# Patient Record
Sex: Male | Born: 1945
Health system: Southern US, Community
[De-identification: ages and names within clinical notes are randomized; demographics above are authoritative.]

## PROBLEM LIST (undated history)

## (undated) DIAGNOSIS — D62 Acute posthemorrhagic anemia: Secondary | ICD-10-CM

## (undated) DIAGNOSIS — Z9889 Other specified postprocedural states: Secondary | ICD-10-CM

## (undated) DIAGNOSIS — I712 Thoracic aortic aneurysm, without rupture: Secondary | ICD-10-CM

## (undated) DIAGNOSIS — Z8719 Personal history of other diseases of the digestive system: Secondary | ICD-10-CM

## (undated) DIAGNOSIS — K5790 Diverticulosis of intestine, part unspecified, without perforation or abscess without bleeding: Secondary | ICD-10-CM

## (undated) DIAGNOSIS — D649 Anemia, unspecified: Secondary | ICD-10-CM

## (undated) DIAGNOSIS — D369 Benign neoplasm, unspecified site: Secondary | ICD-10-CM

## (undated) DIAGNOSIS — I10 Essential (primary) hypertension: Secondary | ICD-10-CM

## (undated) DIAGNOSIS — J439 Emphysema, unspecified: Secondary | ICD-10-CM

## (undated) DIAGNOSIS — M1990 Unspecified osteoarthritis, unspecified site: Secondary | ICD-10-CM

## (undated) DIAGNOSIS — C3490 Malignant neoplasm of unspecified part of unspecified bronchus or lung: Secondary | ICD-10-CM

## (undated) DIAGNOSIS — I499 Cardiac arrhythmia, unspecified: Secondary | ICD-10-CM

## (undated) DIAGNOSIS — I509 Heart failure, unspecified: Secondary | ICD-10-CM

## (undated) DIAGNOSIS — G4700 Insomnia, unspecified: Secondary | ICD-10-CM

## (undated) DIAGNOSIS — I8500 Esophageal varices without bleeding: Secondary | ICD-10-CM

## (undated) HISTORY — DX: Thoracic aortic aneurysm, without rupture: I71.2

## (undated) HISTORY — PX: ROTATOR CUFF REPAIR: SHX139

## (undated) HISTORY — DX: Anemia, unspecified: D64.9

## (undated) HISTORY — DX: Essential (primary) hypertension: I10

## (undated) HISTORY — DX: Emphysema, unspecified: J43.9

## (undated) HISTORY — DX: Acute posthemorrhagic anemia: D62

## (undated) HISTORY — DX: Insomnia, unspecified: G47.00

## (undated) HISTORY — PX: HERNIA REPAIR: SHX51

## (undated) HISTORY — DX: Malignant neoplasm of unspecified part of unspecified bronchus or lung: C34.90

## (undated) HISTORY — DX: Esophageal varices without bleeding: I85.00

## (undated) HISTORY — PX: APPENDECTOMY: SHX54

## (undated) HISTORY — PX: TONSILLECTOMY: SUR1361

## (undated) HISTORY — DX: Diverticulosis of intestine, part unspecified, without perforation or abscess without bleeding: K57.90

## (undated) HISTORY — DX: Benign neoplasm, unspecified site: D36.9

## (undated) HISTORY — PX: EXCISIONAL HEMORRHOIDECTOMY: SHX1541

## (undated) HISTORY — DX: Personal history of other diseases of the digestive system: Z87.19

## (undated) HISTORY — DX: Other specified postprocedural states: Z98.890

## (undated) HISTORY — PX: CATARACT EXTRACTION, BILATERAL: SHX1313

---

## 2004-09-23 ENCOUNTER — Ambulatory Visit: Payer: Self-pay | Admitting: Family Medicine

## 2007-09-12 ENCOUNTER — Ambulatory Visit: Payer: Self-pay | Admitting: Internal Medicine

## 2007-09-12 DIAGNOSIS — I10 Essential (primary) hypertension: Secondary | ICD-10-CM | POA: Insufficient documentation

## 2007-09-17 ENCOUNTER — Telehealth: Payer: Self-pay | Admitting: Internal Medicine

## 2008-01-03 ENCOUNTER — Ambulatory Visit (HOSPITAL_BASED_OUTPATIENT_CLINIC_OR_DEPARTMENT_OTHER): Admission: RE | Admit: 2008-01-03 | Discharge: 2008-01-03 | Payer: Self-pay | Admitting: Orthopedic Surgery

## 2008-08-29 ENCOUNTER — Ambulatory Visit: Payer: Self-pay | Admitting: Internal Medicine

## 2008-08-29 ENCOUNTER — Emergency Department (HOSPITAL_COMMUNITY): Admission: EM | Admit: 2008-08-29 | Discharge: 2008-08-30 | Payer: Self-pay | Admitting: Emergency Medicine

## 2009-03-02 ENCOUNTER — Ambulatory Visit: Payer: Self-pay | Admitting: Gastroenterology

## 2009-03-17 ENCOUNTER — Encounter: Payer: Self-pay | Admitting: Gastroenterology

## 2009-03-17 ENCOUNTER — Ambulatory Visit: Payer: Self-pay | Admitting: Gastroenterology

## 2009-03-19 ENCOUNTER — Encounter: Payer: Self-pay | Admitting: Gastroenterology

## 2009-12-29 ENCOUNTER — Ambulatory Visit (HOSPITAL_BASED_OUTPATIENT_CLINIC_OR_DEPARTMENT_OTHER): Admission: RE | Admit: 2009-12-29 | Discharge: 2009-12-29 | Payer: Self-pay | Admitting: Surgery

## 2010-12-08 LAB — BASIC METABOLIC PANEL
CO2: 25 mEq/L (ref 19–32)
Chloride: 103 mEq/L (ref 96–112)
GFR calc Af Amer: >60 mL/min (ref >60)
Glucose, Bld: 121 mg/dL — ABNORMAL HIGH (ref 70–99)
Sodium: 134 mEq/L — ABNORMAL LOW (ref 135–145)

## 2011-02-01 NOTE — Op Note (Signed)
[name], [name]                ACCOUNT NO.:  [id number]   MEDICAL RECORD NO.:  [id number]          PATIENT TYPE:  AMB   LOCATION:  [facility]                         FACILITY:  [facility]   PHYSICIAN:  Deidre Ala, M.D.    DATE OF BIRTH:  06-20-1946   DATE OF PROCEDURE:  01/03/2008  DATE OF DISCHARGE:                               OPERATIVE REPORT   PREOPERATIVE DIAGNOSES:  1. Right shoulder impingement with type 4 acromion.  2. Rotator cuff tear retracted by MRI and biceps tendon rupture.   POSTOPERATIVE DIAGNOSES:  1. Right shoulder impingement with type 4 acromion.  2. Massive chronic rotator cuff tear avulsion off tuberosity.  3. Biceps tendon rupture with the intra-articular fragment.  4. Osteoarthritis AC joint.   PROCEDURES:  1. His right shoulder arthroscopy with mini open rotator cuff repair      of chronic retracted rotator cuff avulsion off tuberosity with      three BioCorkscrew anchors.  2. Arthroscopic subacromial arch decompression acromioplasty.  3. Arthroscopic distal clavicle resection.  4. Debride intra-articular long head biceps.   SURGEON:  1. Charlesetta Shanks, M.D.   ASSISTANT:  Phineas Semen, P.A.C.   ANESTHESIA:  General with LMA with endotracheal.   CULTURES:  None.   DRAINS:  None.   BLOOD LOSS:  Minimal to 100 mL.   REPLACED:  Without.   PATHOLOGIC FINDINGS AND HISTORY:  Richard Irwin came in to see me  following a shoulder injury.  He was sent for consultation by Dr. Doran Clay.  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  This was Feb 02, 2007.  He then  noted several days later the onset of a large amount of subcutaneous  bleeding and hematoma.  We felt he had a muscle sprain with bleeding mid  substance biceps right not distal and not proximal.  We got an MRI.  The  MRI showed a long head biceps rupture.  I think it was a retracted long  head biceps rupture.  There was a retracted  supraspinatus tear but a  full MRI was needed to further assess.  He was having pain just reaching  to start his car keys in the ignition.  We got a gadolinium MRI of the  right shoulder which showed a rotator cuff tear, 2 cm retracted and he  was denied Workers' Comp.  At this point, he had a very sharp huge type  4 anterior acromion.  He did not have pain but the pain worsened over  time and we felt that there was some possibility that he might be able  to repair his rotator cuff.  In any case, he finally went ahead on his  regular insurance with the surgical intervention.  At surgery, we found  a torn biceps with about a 1.5 cm stump of biceps intra-articular that  was impinging.  This was resected back to the superior labrum which was  intact.  The labral rim was somewhat frayed around, we debrided that.  There was some synovitis in  the joint.  The glenohumeral surfaces looked  good.  He then had a sharp craggy very large anterior acromial hook  actually bilobate which we resected to Caspari margins.  He had obvious  arthritic distal clavicle.  From this side, his rotator cuff tear  measured 2.5 x 2.5 cm retracted off the tuberosity.  He had an apex but  it pulled back to the tuberosity straight line across as is often the  case and it was retractable back to a nice footprint at the base of the  tuberosity.  The footprint was freshened and three BioCorkscrew anchors,  each containing to #1 FiberWires were placed in a triangular pattern in  the footprint and the rotator cuff was brought back to it, sutured from  underneath the top with horizontal mattress sutures #6 and an additional  central tagging suture #2 Ethibond which was used and taken through the  tuberosity.  The suture knots on the FiberWires were laid down with the  tails taken through the soft tissues laterally with an additional knot  tied there to get the knot out of the joint.  We then palpated the  acromioplasty distal  clavicle resection at the open surgery and they  were very good resections.  Good deltoid repair was obtained.   PROCEDURE:  With adequate anesthesia obtained using endotracheal  technique, 1 gram Ancef given IV prophylaxis, the patient was placed in  the supine beach chair position, the right shoulder was then prepped and  draped in standard fashion.  Anatomic markings were then made with a  pin.  We then injected 20 mL 0.5% Marcaine in the subacromial space to  open it up.  I then entered the shoulder through a posterior portal,  anterior portal was established just lateral to the coracoid.  I then  entered the shoulder joint, found the biceps rupture and through the  anterior portal, brought in a basket and shaver and debrided it back to  the superior labrum.  Ablator was then used on one to smooth and further  synovectomy and labral fraying was debrided.  Portals reversed and  similar shavings carried out.  I then entered the shoulder through the  posterior portal, anterolateral portal was established.  This was into  the subacromial space.  I then brought in the shaver and debrided soft  tissue from the anterior undersurface of the acromion and used the  ablator to cauterize.  I then brought in a 6 and completed acromioplasty  to the roof of the subacromial space in the manner of Caspari.  The  scope was then turned medially sideways where through the anterior  portal, I debrided AC meniscus and completed distal clavicle resection  two shaver breadths in.  I then entered the shoulder from the  anterolateral portal, further debrided the distal clavicle and completed  acromioplasty back to the bicortical bone in the manner of Caspari.  The  scope was then turned downward.  I debrided some of the leading edge of  the rotator cuff tear but further evaluated it and found it to be a  retracted tear as above.  I made an additional portal to bring in a  pituitary forceps and was able to pull  the tear back to the footprint.  I then painted and prepped with Betadine and then made an incision from  the anterior acromion to the anterolateral portal which was now  posterolateral to the additional portal I made.  Then incision deepened  sharply  with a knife and hemostasis obtained using the Bovie  electrocoagulator.  Retractors were placed and dissection was carried  down through the deltoid.  Retractors were placed deep and then I  entered the shoulder joint exposing the rotator cuff tear at the  tuberosity.  Then irrigated and used suction and came down upon the  rotator cuff.  I slightly debrided the prominent tuberosity and  freshened the footprint.  A  tagging stitch of #2 Ethibond was placed  from underneath horizontal mattress to retract on the suture.  I then  placed three  5.5 BioCorkscrew anchors without tamping in a V-shaped  pattern apex lateral in the footprint.  The FiberWires were then taken  across the row of the rotator cuff about 7 mm in and up and through with  horizontal mattress sutures.  I then retracted the rotator cuff back to  the tuberosity to the footprint and suture all FiberWire sutures down  nicely repairing the rotator cuff down into the footprint.  I then took  the #2 Ethibond through the tuberosity and out laterally to it and  sutured that down.  When I tied that knot, the closer FiberWire tails of  the knots were then sutured down to make the knot flatten out.  I then  took the rest of the knots of the remaining four and took the tails  through the soft tissues laterally, did four stitches there, four throws  there and that flattened the knots out across the board at the  tuberosity further flattening down the rotator cuff sutures at the edge  of the footprint.  Irrigation was carried out.  Sponge count was  correct.  The wound was then closed with running locking #1 Vicryl on  the deltoid just lateral to the anterior acromion with good fascial  and  muscular repair obtained.  I then used 0 and 2-0 Vicryl on the subcu and  skin staples.  The portals were closed with a  4-0 nylon.  Bulky sterile compressive dressing was applied with sling  and the patient, having tolerated the procedure well, was awakened,  taken to the recovery room in satisfactory condition to be discharged  per outpatient routine.  Given Percocet and some Mepergan Fortis for  pain.  Told call the office for appointment for recheck tomorrow.           ______________________________  V. Charlesetta Shanks, M.D.     VEP/MEDQ  D:  01/03/2008  T:  01/03/2008  Job:  [id number]   cc:   Al Decant. Janey Greaser, MD  Fax: [phone]

## 2011-02-01 NOTE — Consult Note (Signed)
NAMELEMARCUS, [name]:  [id number]   MEDICAL RECORD NO.:  [id number]          PATIENT TYPE:  EMS   LOCATION:  [facility]                         FACILITY:  [facility]   PHYSICIAN:  Gordy Savers, MDDATE OF BIRTH:  1945-09-25   DATE OF CONSULTATION:  DATE OF DISCHARGE:                                 CONSULTATION   CHIEF COMPLAINT:  Loss of consciousness.   HISTORY OF PRESENT ILLNESS:  The patient is a 65 year old gentleman with  a history of treated hypertension.  He was stable until this evening  when shortly after finishing a meal at a restaurant, he became slightly  nauseated and flushed.  He became quite weak and felt that he may pass  out, and in fact did have a syncopal episode.  His wife describes the  patient as being quite pale and diaphoretic.  EMS service evaluated the  patient and he was noted initially to be slightly hypotensive with  bradycardia.  The patient was transported via EMS to the emergency  department for evaluation.  The patient states that he quickly became  very alert and felt quite well and has not had any recurrent symptoms.  He has been observed in the emergency department setting for  approximately 4 hours without any rhythm disturbance.  He has felt well  and has been ambulatory.  ED evaluation included an electrocardiogram  that was normal.  On arrival, the patient was slightly hypotensive with  a blood pressure of 107/76.  Subsequent blood pressures have been in the  120/80 range.  Laboratory screen was unremarkable.  This included a  urinalysis, chemistries, and cardiac markers.  A head CT was normal.  Chest x-ray revealed normal heart size and no pulmonary infiltrates.   The patient denies any headaches or history of seizures, and no seizure  activity, incontinence, or tongue biting were noted at the scene.  At  the present time, he complains of no numbness, weakness, or any focal  neurological symptoms.  He states that a  number of years ago, he had a  very similar episode after consuming oysters.   Past medical history is fairly unremarkable except for a treated  hypertension.  He is on a single unclear antihypertensive medication.  He has no drug allergies.  Past medical history is fairly unremarkable  without medical hospital admissions.  He has had some shrapnel injury in  the [place] War.  He has had right rotator cuff surgery and remote  appendectomy.  In 1971, he underwent a tonsillectomy and in 1998 a  hemorrhoidectomy.  A right inguinal hernia repair was performed in 1979.   Family history is noncontributory.  Father died at age 70 of alcoholic  cirrhosis.  Mother died recently of lung cancer at 78.  Two sisters are  well.   SOCIAL HISTORY:  Married, nonsmoker.   PHYSICAL EXAMINATION:  GENERAL:  The patient to be alert, healthy  appearing, fit, in no acute distress.  VITAL SIGNS:  Blood pressure is 120/78 without orthostatic change, pulse  was low 70s and regular.  SKIN:  Warm and dry  without rash.  HEENT:  Head and neck revealed no signs of trauma.  Pupil responses were  normal.  Conjunctivae clear.  ENT normal.  NECK:  No bruits or adenopathy.  CHEST:  Clear.  CARDIOVASCULAR:  Revealed normal S1 and S2.  No murmurs or gallops  noted.  ABDOMEN:  Benign.  EXTREMITIES:  Revealed full pulses.  No edema.  NEUROLOGIC:  The patient was alert and oriented.  Cranial nerve  examination was unremarkable.  Motor exam revealed no weakness.  Gait  was normal.  Reflexes were symmetrical.   IMPRESSION:  Vasovagal syncope.   DISPOSITION:  Options were discussed including overnight hospital  admission.  The patient wishes to defer unless this was strongly  medically indicated.  The patient was discharged and has been asked to  hold his blood pressure medication and follow up with his primary care  Richard Irwin within the next 3-4 days and report any further symptoms.   CONDITION ON DISCHARGE:   Stable.      Gordy Savers, MD  Electronically Signed     PFK/MEDQ  D:  08/30/2008  T:  08/30/2008  Job:  [id number]

## 2011-06-14 LAB — POCT I-STAT, CHEM 8
BUN: 11
Creatinine, Ser: 1.3
Glucose, Bld: 112 — ABNORMAL HIGH
Potassium: 3.7
Sodium: 137

## 2011-06-24 LAB — URINALYSIS, ROUTINE W REFLEX MICROSCOPIC
Glucose, UA: NEGATIVE mg/dL
Hgb urine dipstick: NEGATIVE
Specific Gravity, Urine: 1.012 (ref 1.005–1.030)
pH: 7 (ref 5.0–8.0)

## 2011-06-24 LAB — POCT CARDIAC MARKERS
CKMB, poc: 1.2 ng/mL (ref 1.0–8.0)
Troponin i, poc: <0.05 ng/mL (ref 0.00–0.09)

## 2011-06-24 LAB — URINE CULTURE
Colony Count: NO GROWTH
Culture: NO GROWTH

## 2011-06-24 LAB — DIFFERENTIAL
Basophils Relative: 1 % (ref 0–1)
Eosinophils Absolute: 0.1 10*3/uL (ref 0.0–0.7)
Lymphs Abs: 1.6 10*3/uL (ref 0.7–4.0)
Monocytes Absolute: 0.5 10*3/uL (ref 0.1–1.0)
Monocytes Relative: 9 % (ref 3–12)

## 2011-06-24 LAB — CBC
HCT: 40.7 % (ref 39.0–52.0)
MCHC: 34.1 g/dL (ref 30.0–36.0)

## 2011-06-24 LAB — POCT I-STAT, CHEM 8
Calcium, Ion: 1.11 mmol/L — ABNORMAL LOW (ref 1.12–1.32)
Chloride: 102 mEq/L (ref 96–112)
Creatinine, Ser: 1.3 mg/dL (ref 0.4–1.5)
Glucose, Bld: 124 mg/dL — ABNORMAL HIGH (ref 70–99)
HCT: 43 % (ref 39.0–52.0)

## 2011-12-21 ENCOUNTER — Encounter: Payer: Self-pay | Admitting: Gastroenterology

## 2012-12-21 ENCOUNTER — Encounter: Payer: Self-pay | Admitting: Gastroenterology

## 2013-04-30 ENCOUNTER — Other Ambulatory Visit: Payer: Self-pay | Admitting: *Deleted

## 2013-04-30 DIAGNOSIS — I77810 Thoracic aortic ectasia: Secondary | ICD-10-CM

## 2013-05-02 LAB — CREATININE, SERUM: Creat: 0.9 mg/dL (ref 0.50–1.35)

## 2013-05-03 ENCOUNTER — Ambulatory Visit
Admission: RE | Admit: 2013-05-03 | Discharge: 2013-05-03 | Disposition: A | Discharge status: Home / Self Care | Payer: Medicare Other | Source: Ambulatory Visit | Attending: Thoracic Surgery (Cardiothoracic Vascular Surgery) | Admitting: Thoracic Surgery (Cardiothoracic Vascular Surgery)

## 2013-05-03 DIAGNOSIS — I77810 Thoracic aortic ectasia: Secondary | ICD-10-CM

## 2013-05-03 MED ORDER — IOHEXOL 350 MG/ML SOLN
80.0000 mL | Freq: Once | INTRAVENOUS | Status: AC | PRN
  Administered 2013-05-03: 80 mL via INTRAVENOUS

## 2013-05-06 DIAGNOSIS — I77810 Thoracic aortic ectasia: Secondary | ICD-10-CM | POA: Insufficient documentation

## 2013-05-07 ENCOUNTER — Institutional Professional Consult (permissible substitution) (INDEPENDENT_AMBULATORY_CARE_PROVIDER_SITE_OTHER): Payer: Medicare Other | Admitting: Thoracic Surgery (Cardiothoracic Vascular Surgery)

## 2013-05-07 ENCOUNTER — Encounter: Payer: Self-pay | Admitting: Thoracic Surgery (Cardiothoracic Vascular Surgery)

## 2013-05-07 VITALS — BP 134/87 | HR 82 | Resp 16 | Ht 72.0 in | Wt 187.0 lb

## 2013-05-07 DIAGNOSIS — I77810 Thoracic aortic ectasia: Secondary | ICD-10-CM

## 2013-05-07 DIAGNOSIS — I7121 Aneurysm of the ascending aorta, without rupture: Secondary | ICD-10-CM

## 2013-05-07 DIAGNOSIS — I712 Thoracic aortic aneurysm, without rupture: Secondary | ICD-10-CM

## 2013-05-07 HISTORY — DX: Thoracic aortic aneurysm, without rupture: I71.2

## 2013-05-07 HISTORY — DX: Aneurysm of the ascending aorta, without rupture: I71.21

## 2013-05-07 NOTE — H&P (Signed)
[address]       [place] [address]             [phone]     CARDIOTHORACIC SURGERY CONSULTATION REPORT  Referring Provider is Donato Schultz, MD PCP is Daisy Floro, MD  Chief Complaint  Patient presents with  . Thoracic Aortic Aneurysm    eval and treat.Marland KitchenMarland KitchenCTA CHEST, ECHO    HPI:  Patient is a 67 year old retired white male from [place] with history of hypertension who was recently referred to Dr. Anne Fu for evaluation of new onset symptoms of exertional chest tightness and shortness of breath. He reportedly underwent radionucleotide stress test that was felt to be low risk for coronary ischemia. He also had a transthoracic echocardiogram performed which revealed normal left ventricular size and function with an enlarged aortic root was felt to measure close to 5 cm in diameter. The patient was referred for surgical consultation.  The patient reports relatively recent onset of symptoms of exertional shortness of breath tightness across his chest. This occurs only with strenuous physical activity such as mowing the lawn. Symptoms are promptly relieved by rest.  He otherwise denies any symptoms of chest pain or back pain which could be in any manner related to his thoracic aorta. He remains reasonably active physically and has no significant physical limitations. He specifically denies any resting chest pain or shortness of breath. He has not had any tachypalpitations, dizzy spells, nor syncope.  Past Medical History  Diagnosis Date  . Hypertension   . Insomnia   . Thoracic ascending aortic aneurysm 05/07/2013    Past Surgical History  Procedure Laterality Date  . Appendectomy    . Hernia repair    . Rotator cuff repair      Family History  Problem Relation Age of Onset  . Cirrhosis Father   . Lung cancer Mother     History   Social History  . Marital Status: Married    Spouse Name: N/A    Number of Children: N/A  . Years of  Education: N/A   Occupational History  . retired Airline pilot    Social History Main Topics  . Smoking status: Never Smoker   . Smokeless tobacco: Never Used  . Alcohol Use: Yes     Comment: beer  . Drug Use: No  . Sexual Activity: Not on file   Other Topics Concern  . Not on file   Social History Narrative  . No narrative on file    Current Outpatient Prescriptions  Medication Sig Dispense Refill  . aspirin 81 MG tablet Take 81 mg by mouth daily.      . hydrochlorothiazide (MICROZIDE) 12.5 MG capsule Take 12.5 mg by mouth daily.      Marland Kitchen losartan (COZAAR) 50 MG tablet Take 50 mg by mouth daily.      . metoprolol (LOPRESSOR) 50 MG tablet Take 50 mg by mouth daily.      . sildenafil (VIAGRA) 100 MG tablet Take 100 mg by mouth daily as needed for erectile dysfunction.      . traZODone (DESYREL) 50 MG tablet Take 50 mg by mouth at bedtime.       No current facility-administered medications for this visit.    Allergies  Allergen Reactions  . Lisinopril Cough      Review of Systems:   General:  normal appetite, decreased energy, no weight gain, no weight loss, no fever  Cardiac:  + chest pain with  exertion, no chest pain at rest, + SOB with strenuous exertion, no resting SOB, no PND, no orthopnea, no palpitations, no arrhythmia, no atrial fibrillation, no LE edema, no dizzy spells, no syncope  Respiratory:  no shortness of breath, no home oxygen, no productive cough, no dry cough, no bronchitis, no wheezing, no hemoptysis, no asthma, no pain with inspiration or cough, no sleep apnea, no CPAP at night  GI:   no difficulty swallowing, + reflux, + frequent heartburn, no hiatal hernia, no abdominal pain, no constipation, no diarrhea, no hematochezia, no hematemesis, no melena  GU:   no dysuria,  + frequency, no urinary tract infection, no hematuria, no enlarged prostate, no kidney stones, no kidney disease  Vascular:  no pain suggestive of claudication, no pain in feet, no leg cramps, no  varicose veins, no DVT, no non-healing foot ulcer  Neuro:   no stroke, no TIA's, no seizures, no headaches, no temporary blindness one eye,  no slurred speech, no peripheral neuropathy, no chronic pain, no instability of gait, no memory/cognitive dysfunction  Musculoskeletal: no arthritis, no joint swelling, no myalgias, no difficulty walking, normal mobility   Skin:   no rash, no itching, no skin infections, no pressure sores or ulcerations  Psych:   + anxiety, + depression, no nervousness, no unusual recent stress  Eyes:   no blurry vision, no floaters, no recent vision changes, no wears glasses or contacts  ENT:   no hearing loss, no loose or painful teeth, no dentures  Hematologic:  + easy bruising, no abnormal bleeding, no clotting disorder, no frequent epistaxis  Endocrine:  no diabetes, does not check CBG's at home     Physical Exam:   BP 134/87  Pulse 82  Resp 16  Ht 6' (1.829 m)  Wt 84.823 kg (187 lb)  BMI 25.36 kg/m2  SpO2 98%  General:    well-appearing  HEENT:  Unremarkable   Neck:   no JVD, no bruits, no adenopathy   Chest:   clear to auscultation, symmetrical breath sounds, no wheezes, no rhonchi   CV:   RRR, no murmur   Abdomen:  soft, non-tender, no masses   Extremities:  warm, well-perfused, pulses palpable, no LE edema  Rectal/GU  Deferred  Neuro:   Grossly non-focal and symmetrical throughout  Skin:   Clean and dry, no rashes, no breakdown   Diagnostic Tests:   CT ANGIOGRAPHY CHEST  Technique: Multidetector CT imaging of the chest using the  standard protocol during bolus administration of intravenous  contrast. Multiplanar reconstructed images including MIPs were  obtained and reviewed to evaluate the vascular anatomy.  Contrast: 80mL OMNIPAQUE IOHEXOL 350 MG/ML SOLN  Comparison: None.  Findings: Ascending aortic aneurysm, aorta measuring 4.9 cm  transverse diameter at the sinotubular junction, 4.6 cm proximal  ascending, 4.3 cm distal ascending, 3.9 cm  proximal arch, 3.0 cm  distal arch, 3.5 cm proximal descending, tapering to a 2.6 cm in  the distal descending just above the diaphragm. Negative for  dissection. Classic three-vessel brachiocephalic arterial origin  anatomy without proximal stenosis.  There is incomplete opacification of the pulmonary arterial tree;  exam was not optimized for detection of pulmonary emboli. No  pleural or pericardial effusion. Extensive coronary  calcifications. Mild four-chamber cardiac enlargement. Sub  centimeter precarinal, AP window, and right paratracheal lymph  nodes. No hilar adenopathy. Plate-like atelectasis or scarring in  the left lower lobe. Focal atelectasis/consolidation at the  inferior extent of the lingula. Right lung clear. Mild  diffuse  fatty infiltration of the liver. Mild atheromatous plaque in the  visualized abdominal aorta without dissection or aneurysm.  Remainder visualized upper abdomen unremarkable. Minimal  spondylitic changes in the mid and lower thoracic spine. Sternum  intact.  IMPRESSION:  1. 4.9 cm ascending aortic aneurysm as above, without complicating  features.  2. Atherosclerosis, including . coronary artery disease. Please  note that although the presence of coronary artery calcium  documents the presence of coronary artery disease, the severity of  this disease and any potential stenosis cannot be assessed on this  non-gated CT examination. Assessment for potential risk factor  modification, dietary therapy or pharmacologic therapy may be  warranted, if clinically indicated.  Original Report Authenticated By: D. Andria Rhein, MD    Aortic Size Index=       4.9  /Body surface area is 2.08 meters squared. = 2.36  < 2.75 cm/m2      4% risk per year 2.75 to 4.25          8% risk per year > 4.25 cm/m2    20% risk per year  cross sectional area of aorta cm2/height in meters > 10 consider  Surgery   TRANSTHORACIC ECHOCARDIOGRAM  Report and images of  transthoracic echocardiogram performed 04/18/2013 are both reviewed. There is mild concentric left ventricular hypertrophy with normal left ventricular systolic function. Ejection fraction was estimated 55-60%. There were no regional wall motion abnormalities. There was trace mitral regurgitation, trace aortic regurgitation, and trivial tricuspid regurgitation.  The aortic valve appeared tri-leaflet and normal anatomically. There was moderate dilatation of the aortic root measuring close to 5 cm at the sinus of Valsalva.   Impression:  Moderate dilatation of the aortic root and ascending thoracic aorta. The CT angiogram was not cardiac gated and there does appear to be some significant motion artifact in the aortic root. The maximum diameter was reported to be 4.9 cm, but this may be generous. The ascending thoracic aorta above the sinotubular junction measures just less than 4 cm. The descending thoracic aorta measures greater than 3 cm.  Overall the associated potential risk for rupture or dissection should be quite low. The patient does have hypertension, but this appears to be under control with medical therapy at the present time. The patient's symptoms are suspicious for angina pectoris, although reportedly his recent nuclear stress test was felt to be low risk.   Plan:  We will plan followup cardiac gated CT angiogram of the chest in one year. The patient and his wife have been counseled regarding the indications of his anatomical finding. Overall the risk of potential aortic dissection or rupture should remain quite low as long as his blood pressure is controlled medically. The patient has no significant physical restrictions.    Salvatore Decent. Cornelius Moras, MD 05/07/2013 1:08 PM

## 2013-06-07 ENCOUNTER — Institutional Professional Consult (permissible substitution): Payer: Medicare Other | Admitting: Internal Medicine

## 2013-07-19 ENCOUNTER — Encounter: Payer: Self-pay | Admitting: Internal Medicine

## 2013-07-19 ENCOUNTER — Ambulatory Visit (INDEPENDENT_AMBULATORY_CARE_PROVIDER_SITE_OTHER): Payer: Medicare Other | Admitting: Internal Medicine

## 2013-07-19 ENCOUNTER — Ambulatory Visit (INDEPENDENT_AMBULATORY_CARE_PROVIDER_SITE_OTHER)
Admission: RE | Admit: 2013-07-19 | Discharge: 2013-07-19 | Disposition: A | Discharge status: Home / Self Care | Payer: Medicare Other | Source: Ambulatory Visit | Attending: Internal Medicine | Admitting: Internal Medicine

## 2013-07-19 VITALS — BP 118/80 | HR 94 | Ht 71.0 in | Wt 189.8 lb

## 2013-07-19 DIAGNOSIS — R634 Abnormal weight loss: Secondary | ICD-10-CM

## 2013-07-19 DIAGNOSIS — Z23 Encounter for immunization: Secondary | ICD-10-CM

## 2013-07-19 DIAGNOSIS — R0609 Other forms of dyspnea: Secondary | ICD-10-CM

## 2013-07-19 DIAGNOSIS — R0989 Other specified symptoms and signs involving the circulatory and respiratory systems: Secondary | ICD-10-CM

## 2013-07-19 NOTE — Patient Instructions (Signed)
Flu vax  Order- CXR                             Dx dyspnea with exertion  Order- schedule PFT and 6 MWT      Sample Spiriva inhaler      1 puff once daily

## 2013-07-19 NOTE — Progress Notes (Signed)
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 Mother was a smoker, dying with emphysema and lung cancer. [name] reports "agent orange" exposure in [place] and questions if that affected his lungs he has been aware of some shortness of breath especially with exertion over the last 7 months. Onset seemed abrupt in April while mowing and was recurrent and episodic initially. He is noticing shortness of breath as he lies back into bed after getting up at night for bathroom. Occasional mild wheeze and morning cough with no phlegm. Some chest tightness while exerting but no chest pain or palpitation. He reports loss of energy and has lost 8 or 10 pounds in the past year. A sample of pro-air rescue inhaler was no help. He has had episodes of acute bronchitis but no pneumonia. He denies anemia, allergy or sinus disease. Office spirometry 05/17/2013/ Dr Tenny Craw- moderate restriction of forced vital capacity with normal spirometry flows, and reduced and parallel with reduced vital capacity. CT chest for aortic aneurysm 04/30/13 There is incomplete opacification of the pulmonary arterial tree;  exam was not optimized for detection of pulmonary emboli. No  pleural or pericardial effusion. Extensive coronary  calcifications. Mild four-chamber cardiac enlargement. Sub  centimeter precarinal, AP window, and right paratracheal lymph  nodes. No hilar adenopathy. Plate-like atelectasis or scarring in  the left lower lobe. Focal atelectasis/consolidation at the  inferior extent of the lingula. Right lung clear. Mild diffuse  fatty infiltration of the liver. Mild atheromatous plaque in the  visualized abdominal aorta without dissection or aneurysm.  Remainder visualized upper abdomen unremarkable. Minimal  spondylitic changes in the mid and lower thoracic spine. Sternum  intact.  IMPRESSION:  1. 4.9 cm ascending  aortic aneurysm as above, without complicating  features.  2. Atherosclerosis, including . coronary artery disease. Please  note that although the presence of coronary artery calcium  documents the presence of coronary artery disease, the severity of  this disease and any potential stenosis cannot be assessed on this  non-gated CT examination. Assessment for potential risk factor  modification, dietary therapy or pharmacologic therapy may be  warranted, if clinically indicated.  Original Report Authenticated By: D. Andria Rhein, MD  Prior to Admission medications   Medication Sig Start Date End Date Taking? Authorizing Provider  hydrochlorothiazide (HYDRODIURIL) 25 MG tablet Take 12.5 mg by mouth daily.   Yes Historical Provider, MD  losartan (COZAAR) 100 MG tablet Take 50 mg by mouth daily.   Yes Historical Provider, MD  sildenafil (VIAGRA) 100 MG tablet Take 100 mg by mouth daily as needed for erectile dysfunction.   Yes Historical Provider, MD  traZODone (DESYREL) 50 MG tablet Take 50 mg by mouth at bedtime.   Yes Historical Provider, MD  aspirin 81 MG tablet Take 81 mg by mouth daily.    Historical Provider, MD  tiotropium (SPIRIVA) 18 MCG inhalation capsule Place 1 capsule (18 mcg total) into inhaler and inhale daily. 07/24/13   Waymon Budge, MD   Past Medical History  Diagnosis Date  . Hypertension   . Insomnia   . Thoracic ascending aortic aneurysm 05/07/2013   Past Surgical History  Procedure Laterality Date  . Appendectomy    . Hernia repair    . Rotator cuff repair     Family History  Problem Relation Age of Onset  . Cirrhosis Father   . Lung cancer Mother    History  Social History  . Marital Status: Married    Spouse Name: N/A    Number of Children: 1  . Years of Education: N/A   Occupational History  . retired Airline pilot    Social History Main Topics  . Smoking status: Never Smoker   . Smokeless tobacco: Never Used  . Alcohol Use: Yes     Comment: beer 2-4   . Drug Use: No  . Sexual Activity: Not on file   Other Topics Concern  . Not on file   Social History Narrative  . No narrative on file   ROS-see HPI Constitutional:   No-   weight loss, night sweats, fevers, chills, fatigue, lassitude. HEENT:   No-  headaches, difficulty swallowing, tooth/dental problems, sore throat,       No-  sneezing, itching, ear ache, nasal congestion, post nasal drip,  CV:  No-   chest pain, orthopnea, PND, swelling in lower extremities, anasarca, dizziness, palpitations Resp: + shortness of breath with exertion or at rest.              No-   productive cough,  + non-productive cough,  No- coughing up of blood.              No-   change in color of mucus.  No- wheezing.  +snores Skin: No-   rash or lesions. GI:  + heartburn, indigestion, no-abdominal pain, nausea, vomiting, diarrhea,                 change in bowel habits, +loss of appetite GU: No-   dysuria, change in color of urine, no urgency or frequency.  No- flank pain. MS:  No-   joint pain or swelling.  No- decreased range of motion.  No- back pain. Neuro-     nothing unusual Psych:  No- change in mood or affect. No depression, + anxiety.  No memory loss.  OBJ- Physical Exam General- Alert, Oriented, Affect-appropriate, Distress- none acute. Medium build Skin- rash-none, lesions- none, excoriation- none Lymphadenopathy- none Head- atraumatic            Eyes- Gross vision intact, PERRLA, conjunctivae pale            Ears- Hearing, canals-normal            Nose- Clear, no-Septal dev, mucus, polyps, erosion, perforation             Throat- Mallampati III , mucosa clear , drainage- none, tonsils- atrophic Neck- flexible , trachea midline, no stridor , thyroid nl, carotid no bruit Chest - symmetrical excursion , unlabored           Heart/CV- RRR , no murmur , no gallop  , no rub, nl s1 s2                           - JVD- none , edema- none, stasis changes- none, varices- none           Lung- clear  to P&A, wheeze- none, cough- none , dullness-none, rub- none           Chest wall-  Abd- tender-no, distended-no, bowel sounds-present, HSM- no Br/ Gen/ Rectal- Not done, not indicated Extrem- cyanosis- none, clubbing, none, atrophy- none, strength- nl Neuro- grossly intact to observation

## 2013-07-24 ENCOUNTER — Other Ambulatory Visit: Payer: Self-pay | Admitting: Internal Medicine

## 2013-07-24 MED ORDER — TIOTROPIUM BROMIDE MONOHYDRATE 18 MCG IN CAPS: 18.0000 ug | ORAL_CAPSULE | Freq: Every day | RESPIRATORY_TRACT | Status: DC

## 2013-07-24 NOTE — Progress Notes (Signed)
Quick Note:  Spoke with the pt and notified of results per Dr Maple Hudson  Denied any questions ______

## 2013-08-04 DIAGNOSIS — R634 Abnormal weight loss: Secondary | ICD-10-CM | POA: Insufficient documentation

## 2013-08-04 DIAGNOSIS — R0609 Other forms of dyspnea: Secondary | ICD-10-CM | POA: Insufficient documentation

## 2013-08-04 NOTE — Assessment & Plan Note (Signed)
Decreased appetite and reported 8- 10 pound weight loss in the past year

## 2013-08-04 NOTE — Assessment & Plan Note (Addendum)
Imaging demonstrate significant atherosclerosis. This may explain his dyspnea. Pulmonary disease is not yet definitely identified. The reduced forced vital capacity may be an effort issue Plan-flu vaccine, verify that lab work elsewhere has not shown anemia. Schedule full PFT and 6 minute walk test. We discussed his history of mild prostatitis and he is willing to try Spiriva. Questionable changes on chest x-ray left lower lobe for followup imaging

## 2013-09-02 ENCOUNTER — Ambulatory Visit (INDEPENDENT_AMBULATORY_CARE_PROVIDER_SITE_OTHER): Payer: Medicare Other | Admitting: Internal Medicine

## 2013-09-02 ENCOUNTER — Encounter (INDEPENDENT_AMBULATORY_CARE_PROVIDER_SITE_OTHER): Payer: Self-pay

## 2013-09-02 ENCOUNTER — Encounter: Payer: Self-pay | Admitting: Internal Medicine

## 2013-09-02 VITALS — BP 134/92 | HR 117 | Ht 71.0 in | Wt 189.0 lb

## 2013-09-02 DIAGNOSIS — R0609 Other forms of dyspnea: Secondary | ICD-10-CM

## 2013-09-02 LAB — PULMONARY FUNCTION TEST
DLCO unc % pred: 80 %
DLCO unc: 27.06 ml/min/mmHg
FEF 25-75 Post: 2.5 L/sec
FEV1-%Change-Post: 6 %
FEV1-%Pred-Post: 75 %
FEV1-Post: 2.63 L
FEV1FVC-%Change-Post: 4 %
FEV6-%Pred-Pre: 71 %
FEV6-Post: 3.26 L
FEV6-Pre: 3.18 L
FEV6FVC-%Change-Post: 1 %
FEV6FVC-%Pred-Post: 105 %
FEV6FVC-%Pred-Pre: 103 %
FVC-%Change-Post: 1 %
FVC-%Pred-Pre: 68 %
FVC-Pre: 3.23 L
Post FEV6/FVC ratio: 100 %
Pre FEV1/FVC ratio: 77 %
Pre FEV6/FVC Ratio: 98 %
RV % pred: 107 %
RV: 2.63 L
TLC % pred: 81 %
TLC: 5.86 L

## 2013-09-02 NOTE — Patient Instructions (Signed)
Ok to use up and stop the Spiriva  Try sample Breo ellipta 1 puff then rinse mouth, one time every day.  If this makes a big difference in your breathing, let us know.  I suggest a gradual, regular walking and active exercise program to lose a little weight and build stamina.

## 2013-09-02 NOTE — Progress Notes (Signed)
PFT done today. 

## 2013-09-02 NOTE — Progress Notes (Addendum)
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 Mother was a smoker, dying with emphysema and lung cancer. Mr. Colee reports "agent orange" exposure in [place] and questions if that affected his lungs he has been aware of some shortness of breath especially with exertion over the last 7 months. Onset seemed abrupt in April while mowing and was recurrent and episodic initially. He is noticing shortness of breath as he lies back into bed after getting up at night for bathroom. Occasional mild wheeze and morning cough with no phlegm. Some chest tightness while exerting but no chest pain or palpitation. He reports loss of energy and has lost 8 or 10 pounds in the past year. A sample of pro-air rescue inhaler was no help. He has had episodes of acute bronchitis but no pneumonia. He denies anemia, allergy or sinus disease. Office spirometry 05/17/2013/ Dr Tenny Craw- moderate restriction of forced vital capacity with normal spirometry flows, and reduced and parallel with reduced vital capacity. CT chest for aortic aneurysm 04/30/13 There is incomplete opacification of the pulmonary arterial tree;  exam was not optimized for detection of pulmonary emboli. No  pleural or pericardial effusion. Extensive coronary  calcifications. Mild four-chamber cardiac enlargement. Sub  centimeter precarinal, AP window, and right paratracheal lymph  nodes. No hilar adenopathy. Plate-like atelectasis or scarring in  the left lower lobe. Focal atelectasis/consolidation at the  inferior extent of the lingula. Right lung clear. Mild diffuse  fatty infiltration of the liver. Mild atheromatous plaque in the  visualized abdominal aorta without dissection or aneurysm.  Remainder visualized upper abdomen unremarkable. Minimal  spondylitic changes in the mid and lower thoracic spine. Sternum  intact.  IMPRESSION:  1. 4.9 cm ascending  aortic aneurysm as above, without complicating  features.  2. Atherosclerosis, including . coronary artery disease. Please  note that although the presence of coronary artery calcium  documents the presence of coronary artery disease, the severity of  this disease and any potential stenosis cannot be assessed on this  non-gated CT examination. Assessment for potential risk factor  modification, dietary therapy or pharmacologic therapy may be  warranted, if clinically indicated.  Original Report Authenticated By: D. Andria Rhein, MD  09/02/13-67 yoM never smoker  Referred courtesy of Dr Hessie Diener Ross/ [facility];  follows for- Pt states he does not see a huge difference with the spiriva.  Pt c/o SOB with exertion, no other complaints at this time. 6 MWT 09/02/13- 96%, 96%, 96% on room air, 516 m. Good exercise tolerance by this test with no oxygen desaturation. PFT 09/02/13- minimal obstructive airways disease with response to bronchodilator. FVC 3.26/69%, FEV1 2.63/75%, FEV1/FVC 0.80, RV/TLC 128%, DLCO 80%. CXR 07/24/13 IMPRESSION:  No active disease. Elevation of the left hemidiaphragm with left  basilar atelectasis.  Electronically Signed  By: Natasha Mead M.D.  On: 07/19/2013 10:10  ROS-see HPI Constitutional:   No-   weight loss, night sweats, fevers, chills, fatigue, lassitude. HEENT:   No-  headaches, difficulty swallowing, tooth/dental problems, sore throat,       No-  sneezing, itching, ear ache, nasal congestion, post nasal drip,  CV:  No-   chest pain, orthopnea, PND, swelling in lower extremities, anasarca, dizziness, palpitations Resp: + shortness of breath with exertion or at rest.              No-   productive cough,  + non-productive cough,  No- coughing up of blood.  No-   change in color of mucus.  No- wheezing.  +snores Skin: No-   rash or lesions. GI:  + heartburn, indigestion, no-abdominal pain, nausea, vomiting, diarrhea,                 change in bowel  habits, +loss of appetite GU: No-   dysuria, change in color of urine, no urgency or frequency.  No- flank pain. MS:  No-   joint pain or swelling.  No- decreased range of motion.  No- back pain. Neuro-     nothing unusual Psych:  No- change in mood or affect. No depression, + anxiety.  No memory loss.  OBJ- Physical Exam General- Alert, Oriented, Affect-appropriate, Distress- none acute. Medium build Skin- rash-none, lesions- none, excoriation- none Lymphadenopathy- none Head- atraumatic            Eyes- Gross vision intact, PERRLA, conjunctivae pale            Ears- Hearing, canals-normal            Nose- Clear, no-Septal dev, mucus, polyps, erosion, perforation             Throat- Mallampati III , mucosa clear , drainage- none, tonsils- atrophic Neck- flexible , trachea midline, no stridor , thyroid nl, carotid no bruit Chest - symmetrical excursion , unlabored           Heart/CV- RRR , no murmur , no gallop  , no rub, nl s1 s2                           - JVD- none , edema- none, stasis changes- none, varices- none           Lung- clear to P&A, wheeze- none, cough- none , dullness-none, rub- none           Chest wall-  Abd- tender-no, distended-no, bowel sounds-present, HSM- no Br/ Gen/ Rectal- Not done, not indicated Extrem- cyanosis- none, clubbing, none, atrophy- none, strength- nl Neuro- grossly intact to observation

## 2013-09-22 NOTE — Progress Notes (Signed)
Documentation for 6 minute walk test 

## 2013-09-22 NOTE — Assessment & Plan Note (Signed)
Dyspnea partly related to mild reactive airways disease and mild atelectasis from weakness left diaphragm. Plan-trial Breo Ellipta, walk for endurance

## 2014-04-16 ENCOUNTER — Other Ambulatory Visit: Payer: Self-pay | Admitting: *Deleted

## 2014-04-16 DIAGNOSIS — I712 Thoracic aortic aneurysm, without rupture, unspecified: Secondary | ICD-10-CM

## 2014-04-16 DIAGNOSIS — I77810 Thoracic aortic ectasia: Secondary | ICD-10-CM

## 2014-04-23 ENCOUNTER — Ambulatory Visit (HOSPITAL_COMMUNITY): Payer: Medicare Other

## 2014-06-09 ENCOUNTER — Encounter: Payer: Self-pay | Admitting: Gastroenterology

## 2014-06-26 ENCOUNTER — Ambulatory Visit (AMBULATORY_SURGERY_CENTER): Payer: Self-pay

## 2014-06-26 VITALS — Ht 71.0 in | Wt 182.4 lb

## 2014-06-26 DIAGNOSIS — Z860100 Personal history of colon polyps, unspecified: Secondary | ICD-10-CM

## 2014-06-26 MED ORDER — MOVIPREP 100 G PO SOLR: 1.0000 | Freq: Once | ORAL | Status: DC

## 2014-06-26 NOTE — Progress Notes (Signed)
No allergies to eggs or soy No past problems with anesthesia No home oxygen use No diet/weight loss meds  Has email  Emmi instructions given for colonoscopy

## 2014-07-04 ENCOUNTER — Other Ambulatory Visit: Payer: Self-pay

## 2014-07-07 ENCOUNTER — Encounter: Payer: Self-pay | Admitting: Gastroenterology

## 2014-07-08 ENCOUNTER — Ambulatory Visit (AMBULATORY_SURGERY_CENTER): Payer: Medicare Other | Admitting: Gastroenterology

## 2014-07-08 ENCOUNTER — Encounter: Payer: Self-pay | Admitting: Gastroenterology

## 2014-07-08 VITALS — BP 151/94 | HR 83 | Temp 98.7°F | Resp 21 | Ht 71.0 in | Wt 182.0 lb

## 2014-07-08 DIAGNOSIS — D122 Benign neoplasm of ascending colon: Secondary | ICD-10-CM

## 2014-07-08 DIAGNOSIS — D123 Benign neoplasm of transverse colon: Secondary | ICD-10-CM

## 2014-07-08 DIAGNOSIS — D120 Benign neoplasm of cecum: Secondary | ICD-10-CM

## 2014-07-08 DIAGNOSIS — Z86010 Personal history of colonic polyps: Secondary | ICD-10-CM

## 2014-07-08 DIAGNOSIS — D124 Benign neoplasm of descending colon: Secondary | ICD-10-CM

## 2014-07-08 MED ORDER — SODIUM CHLORIDE 0.9 % IV SOLN: 500.0000 mL | INTRAVENOUS | Status: DC

## 2014-07-08 NOTE — Progress Notes (Signed)
Pt stable to RR 

## 2014-07-08 NOTE — Patient Instructions (Signed)
YOU HAD AN ENDOSCOPIC PROCEDURE TODAY AT THE [facility]: Refer to the procedure report that was given to you for any specific questions about what was found during the examination.  If the procedure report does not answer your questions, please call your gastroenterologist to clarify.  If you requested that your care partner not be given the details of your procedure findings, then the procedure report has been included in a sealed envelope for you to review at your convenience later.  YOU SHOULD EXPECT: Some feelings of bloating in the abdomen. Passage of more gas than usual.  Walking can help get rid of the air that was put into your GI tract during the procedure and reduce the bloating. If you had a lower endoscopy (such as a colonoscopy or flexible sigmoidoscopy) you may notice spotting of blood in your stool or on the toilet paper. If you underwent a bowel prep for your procedure, then you may not have a normal bowel movement for a few days.  DIET: Your first meal following the procedure should be a light meal and then it is ok to progress to your normal diet.  A half-sandwich or bowl of soup is an example of a good first meal.  Heavy or fried foods are harder to digest and may make you feel nauseous or bloated.  Likewise meals heavy in dairy and vegetables can cause extra gas to form and this can also increase the bloating.  Drink plenty of fluids but you should avoid alcoholic beverages for 24 hours.  ACTIVITY: Your care partner should take you home directly after the procedure.  You should plan to take it easy, moving slowly for the rest of the day.  You can resume normal activity the day after the procedure however you should NOT DRIVE or use heavy machinery for 24 hours (because of the sedation medicines used during the test).    SYMPTOMS TO REPORT IMMEDIATELY: A gastroenterologist can be reached at any hour.  During normal business hours, 8:30 AM to 5:00 PM Monday through Friday,  call [phone].  After hours and on weekends, please call the GI answering service at [phone] who will take a message and have the physician on call contact you.   Following lower endoscopy (colonoscopy or flexible sigmoidoscopy):  Excessive amounts of blood in the stool  Significant tenderness or worsening of abdominal pains  Swelling of the abdomen that is new, acute  Fever of 100F or higher  FOLLOW UP: If any biopsies were taken you will be contacted by phone or by letter within the next 1-3 weeks.  Call your gastroenterologist if you have not heard about the biopsies in 3 weeks.  Our staff will call the home number listed on your records the next business day following your procedure to check on you and address any questions or concerns that you may have at that time regarding the information given to you following your procedure. This is a courtesy call and so if there is no answer at the home number and we have not heard from you through the emergency physician on call, we will assume that you have returned to your regular daily activities without incident.  SIGNATURES/CONFIDENTIALITY: You and/or your care partner have signed paperwork which will be entered into your electronic medical record.  These signatures attest to the fact that that the information above on your After Visit Summary has been reviewed and is understood.  Full responsibility of the confidentiality of this   discharge information lies with you and/or your care-partner.  Polyps, diverticulosis, high fiber diet-handouts given  Hold aspirin, aspirin products and NSAIDs (ibuprofen, motrin, advil, aleve, naproxen, mobic, etc)  Repeat colonoscopy in 3 years 2018.

## 2014-07-08 NOTE — Progress Notes (Signed)
Called to room to assist during endoscopic procedure.  Patient ID and intended procedure confirmed with present staff. Received instructions for my participation in the procedure from the performing physician.  

## 2014-07-08 NOTE — Op Note (Signed)
[place]  [address] [place], [id number]   COLONOSCOPY PROCEDURE REPORT  PATIENT: Richard Irwin, Richard Irwin  MR#: #[id number] BIRTHDATE: [birth year] , 68  yrs. old GENDER: male ENDOSCOPIST: Ladene Artist, MD, [address] [facility] PROCEDURE DATE:  07/08/2014 PROCEDURE:   Colonoscopy with biopsy and Colonoscopy with snare polypectomy First Screening Colonoscopy - Avg.  risk and is 50 yrs.  old or older - No.  Prior Negative Screening - Now for repeat screening. N/A  History of Adenoma - Now for follow-up colonoscopy & has been > or = to 3 yrs.  Yes hx of adenoma.  Has been 3 or more years since last colonoscopy.  Polyps Removed Today? Yes. ASA CLASS:   Class II INDICATIONS:surveillance colonoscopy based on a history of adenomatous colonic polyp(s). MEDICATIONS: Monitored anesthesia care and Propofol 500 mg IV DESCRIPTION OF PROCEDURE:   After the risks benefits and alternatives of the procedure were thoroughly explained, informed consent was obtained.  The digital rectal exam revealed no abnormalities of the rectum.   The LB [id number]  endoscope was introduced through the anus and advanced to the cecum, which was identified by both the appendix and ileocecal valve. No adverse events experienced.   The quality of the prep was good, using MoviPrep  The instrument was then slowly withdrawn as the colon was fully examined.  COLON FINDINGS: A sessile polyp measuring 4 mm in size was found at the cecum.  A polypectomy was performed with cold forceps.  The resection was complete, the polyp tissue was completely retrieved and sent to histology.   A sessile polyp measuring 5 mm in size was found in the ascending colon.  A polypectomy was performed with a cold snare.  The resection was complete, the polyp tissue was completely retrieved and sent to histology.   Three sessile polyps measuring 4 mm in size were found in the transverse colon.  A polypectomy was performed with cold  forceps.  The resection was complete, the polyp tissue was completely retrieved and sent to histology.   A pedunculated polyp measuring 10 mm in size was found in the descending colon.  A polypectomy was performed using snare cautery.  The resection was complete, the polyp tissue was completely retrieved and sent to histology.   There was mild diverticulosis noted in the sigmoid colon.   The examination was otherwise normal.  Retroflexed views revealed no abnormalities. The time to cecum=2 minutes 49 seconds.  Withdrawal time=19 minutes 13 seconds.  The scope was withdrawn and the procedure completed. COMPLICATIONS: There were no immediate complications.  ENDOSCOPIC IMPRESSION: 1.   Sessile polyp at the cecum; polypectomy performed with cold forceps 2.   Sessile polyp in the ascending colon; polypectomy performed with a cold snare 3.   Three sessile polyps in the transverse colon; polypectomy performed with cold forceps 4.   Pedunculated polyp in the descending colon; polypectomy performed using snare cautery 5.   Mild diverticulosis in the sigmoid colon  RECOMMENDATIONS: 1.  Hold Aspirin and all other NSAIDS for 2 weeks. 2.  Await pathology results 3.  High fiber diet with liberal fluid intake. 4.  Repeat Colonoscopy in 3 years.  eSigned:  Ladene Artist, MD, [facility] 07/08/2014 3:02 PM   cc: Melinda Crutch, MD   PATIENT NAME:  Richard Irwin, Richard Irwin MR#: #[id number]

## 2014-07-09 ENCOUNTER — Encounter (HOSPITAL_COMMUNITY)
Admission: EM | Disposition: A | Discharge status: Home / Self Care | Payer: Self-pay | Source: Home / Self Care | Attending: Internal Medicine

## 2014-07-09 ENCOUNTER — Encounter (HOSPITAL_COMMUNITY): Payer: Medicare Other | Admitting: Anesthesiology

## 2014-07-09 ENCOUNTER — Inpatient Hospital Stay (HOSPITAL_COMMUNITY)
Admission: EM | Admit: 2014-07-09 | Discharge: 2014-07-10 | DRG: 920 | Disposition: A | Discharge status: Home / Self Care | Payer: Medicare Other | Attending: Internal Medicine | Admitting: Internal Medicine

## 2014-07-09 ENCOUNTER — Observation Stay (HOSPITAL_COMMUNITY): Payer: Medicare Other | Admitting: Anesthesiology

## 2014-07-09 ENCOUNTER — Encounter (HOSPITAL_COMMUNITY): Payer: Self-pay | Admitting: Emergency Medicine

## 2014-07-09 ENCOUNTER — Telehealth: Payer: Self-pay | Admitting: *Deleted

## 2014-07-09 DIAGNOSIS — I7121 Aneurysm of the ascending aorta, without rupture: Secondary | ICD-10-CM

## 2014-07-09 DIAGNOSIS — Y838 Other surgical procedures as the cause of abnormal reaction of the patient, or of later complication, without mention of misadventure at the time of the procedure: Secondary | ICD-10-CM | POA: Diagnosis present

## 2014-07-09 DIAGNOSIS — I712 Thoracic aortic aneurysm, without rupture: Secondary | ICD-10-CM | POA: Diagnosis present

## 2014-07-09 DIAGNOSIS — R001 Bradycardia, unspecified: Secondary | ICD-10-CM | POA: Diagnosis not present

## 2014-07-09 DIAGNOSIS — K922 Gastrointestinal hemorrhage, unspecified: Secondary | ICD-10-CM | POA: Diagnosis present

## 2014-07-09 DIAGNOSIS — Z9109 Other allergy status, other than to drugs and biological substances: Secondary | ICD-10-CM

## 2014-07-09 DIAGNOSIS — K91840 Postprocedural hemorrhage and hematoma of a digestive system organ or structure following a digestive system procedure: Secondary | ICD-10-CM | POA: Diagnosis present

## 2014-07-09 DIAGNOSIS — D62 Acute posthemorrhagic anemia: Secondary | ICD-10-CM | POA: Diagnosis present

## 2014-07-09 DIAGNOSIS — R0600 Dyspnea, unspecified: Secondary | ICD-10-CM | POA: Diagnosis present

## 2014-07-09 DIAGNOSIS — E876 Hypokalemia: Secondary | ICD-10-CM | POA: Diagnosis present

## 2014-07-09 DIAGNOSIS — R0609 Other forms of dyspnea: Secondary | ICD-10-CM

## 2014-07-09 DIAGNOSIS — R634 Abnormal weight loss: Secondary | ICD-10-CM

## 2014-07-09 DIAGNOSIS — I10 Essential (primary) hypertension: Secondary | ICD-10-CM | POA: Diagnosis present

## 2014-07-09 DIAGNOSIS — G4700 Insomnia, unspecified: Secondary | ICD-10-CM | POA: Diagnosis present

## 2014-07-09 DIAGNOSIS — I77810 Thoracic aortic ectasia: Secondary | ICD-10-CM

## 2014-07-09 DIAGNOSIS — K625 Hemorrhage of anus and rectum: Secondary | ICD-10-CM

## 2014-07-09 DIAGNOSIS — Z9889 Other specified postprocedural states: Secondary | ICD-10-CM | POA: Diagnosis not present

## 2014-07-09 DIAGNOSIS — Z79899 Other long term (current) drug therapy: Secondary | ICD-10-CM | POA: Diagnosis not present

## 2014-07-09 DIAGNOSIS — K5730 Diverticulosis of large intestine without perforation or abscess without bleeding: Secondary | ICD-10-CM | POA: Diagnosis present

## 2014-07-09 HISTORY — PX: COLONOSCOPY WITH PROPOFOL: SHX5780

## 2014-07-09 LAB — DIFFERENTIAL
Basophils Absolute: 0 10*3/uL (ref 0.0–0.1)
Basophils Relative: 0 % (ref 0–1)
EOS ABS: 0.1 10*3/uL (ref 0.0–0.7)
Eosinophils Relative: 1 % (ref 0–5)
LYMPHS ABS: 2 10*3/uL (ref 0.7–4.0)
Lymphocytes Relative: 29 % (ref 12–46)
MONOS PCT: 12 % (ref 3–12)
Monocytes Absolute: 0.8 10*3/uL (ref 0.1–1.0)
Neutro Abs: 4 10*3/uL (ref 1.7–7.7)
Neutrophils Relative %: 58 % (ref 43–77)

## 2014-07-09 LAB — COMPREHENSIVE METABOLIC PANEL
ALK PHOS: 52 U/L (ref 39–117)
ALT: 15 U/L (ref 0–53)
ANION GAP: 16 — AB (ref 5–15)
AST: 24 U/L (ref 0–37)
Albumin: 3.8 g/dL (ref 3.5–5.2)
BUN: 8 mg/dL (ref 6–23)
CO2: 20 mEq/L (ref 19–32)
CREATININE: 0.85 mg/dL (ref 0.50–1.35)
Calcium: 9.2 mg/dL (ref 8.4–10.5)
Chloride: 98 mEq/L (ref 96–112)
GFR calc non Af Amer: 88 mL/min — ABNORMAL LOW (ref >90)
GLUCOSE: 113 mg/dL — AB (ref 70–99)
POTASSIUM: 3.3 meq/L — AB (ref 3.7–5.3)
Sodium: 134 mEq/L — ABNORMAL LOW (ref 137–147)
TOTAL PROTEIN: 7.2 g/dL (ref 6.0–8.3)
Total Bilirubin: 1.4 mg/dL — ABNORMAL HIGH (ref 0.3–1.2)

## 2014-07-09 LAB — MRSA PCR SCREENING: MRSA by PCR: NEGATIVE

## 2014-07-09 LAB — CBC
HCT: 34.4 % — ABNORMAL LOW (ref 39.0–52.0)
HEMATOCRIT: 43.6 % (ref 39.0–52.0)
HEMOGLOBIN: 12 g/dL — AB (ref 13.0–17.0)
Hemoglobin: 14.7 g/dL (ref 13.0–17.0)
MCH: 33.2 pg (ref 26.0–34.0)
MCH: 33.6 pg (ref 26.0–34.0)
MCHC: 33.7 g/dL (ref 30.0–36.0)
MCHC: 34.9 g/dL (ref 30.0–36.0)
MCV: 98.4 fL (ref 78.0–100.0)
MCV: 96.4 fL (ref 78.0–100.0)
PLATELETS: 179 10*3/uL (ref 150–400)
Platelets: 209 10*3/uL (ref 150–400)
RBC: 4.43 MIL/uL (ref 4.22–5.81)
RBC: 3.57 MIL/uL — AB (ref 4.22–5.81)
RDW: 12.5 % (ref 11.5–15.5)
RDW: 12.1 % (ref 11.5–15.5)
WBC: 7 10*3/uL (ref 4.0–10.5)
WBC: 10.2 10*3/uL (ref 4.0–10.5)

## 2014-07-09 LAB — HEMOGLOBIN AND HEMATOCRIT, BLOOD
HEMATOCRIT: 35 % — AB (ref 39.0–52.0)
Hemoglobin: 12 g/dL — ABNORMAL LOW (ref 13.0–17.0)

## 2014-07-09 LAB — MAGNESIUM: Magnesium: 2.1 mg/dL (ref 1.5–2.5)

## 2014-07-09 LAB — TYPE AND SCREEN
ABO/RH(D): B POS
Antibody Screen: NEGATIVE

## 2014-07-09 LAB — ABO/RH: ABO/RH(D): B POS

## 2014-07-09 SURGERY — COLONOSCOPY WITH PROPOFOL
Anesthesia: Monitor Anesthesia Care

## 2014-07-09 SURGERY — COLONOSCOPY
Anesthesia: Moderate Sedation

## 2014-07-09 MED ORDER — PROMETHAZINE HCL 25 MG/ML IJ SOLN: 6.2500 mg | INTRAMUSCULAR | Status: DC | PRN

## 2014-07-09 MED ORDER — LACTATED RINGERS IV SOLN
INTRAVENOUS | Status: DC
  Administered 2014-07-09: 11:00:00 via INTRAVENOUS

## 2014-07-09 MED ORDER — LIDOCAINE HCL (CARDIAC) 20 MG/ML IV SOLN
INTRAVENOUS | Status: DC | PRN
  Administered 2014-07-09: 100 mg via INTRAVENOUS

## 2014-07-09 MED ORDER — ONDANSETRON HCL 4 MG/2ML IJ SOLN
4.0000 mg | Freq: Four times a day (QID) | INTRAMUSCULAR | Status: DC | PRN
  Administered 2014-07-09: 4 mg via INTRAVENOUS

## 2014-07-09 MED ORDER — POTASSIUM CHLORIDE CRYS ER 20 MEQ PO TBCR
40.0000 meq | EXTENDED_RELEASE_TABLET | Freq: Once | ORAL | Status: AC
  Administered 2014-07-09: 40 meq via ORAL
  Filled 2014-07-09: qty 2

## 2014-07-09 MED ORDER — MIDAZOLAM HCL 5 MG/5ML IJ SOLN
INTRAMUSCULAR | Status: DC | PRN
  Administered 2014-07-09: 2 mg via INTRAVENOUS

## 2014-07-09 MED ORDER — EPHEDRINE SULFATE 50 MG/ML IJ SOLN
INTRAMUSCULAR | Status: DC | PRN
  Administered 2014-07-09 (×2): 5 mg via INTRAVENOUS

## 2014-07-09 MED ORDER — PROPOFOL 10 MG/ML IV BOLUS
INTRAVENOUS | Status: AC
  Filled 2014-07-09: qty 20

## 2014-07-09 MED ORDER — POTASSIUM CHLORIDE IN NACL 20-0.9 MEQ/L-% IV SOLN
INTRAVENOUS | Status: DC
Start: 2014-07-09 — End: 2014-07-10
  Administered 2014-07-09 – 2014-07-10 (×3): via INTRAVENOUS
  Filled 2014-07-09 (×4): qty 1000

## 2014-07-09 MED ORDER — ONDANSETRON HCL 4 MG/2ML IJ SOLN
INTRAMUSCULAR | Status: AC
  Filled 2014-07-09: qty 2

## 2014-07-09 MED ORDER — SODIUM CHLORIDE 0.9 % IJ SOLN
PREFILLED_SYRINGE | INTRAMUSCULAR | Status: DC | PRN
  Administered 2014-07-09: 12:00:00

## 2014-07-09 MED ORDER — LOSARTAN POTASSIUM 50 MG PO TABS
50.0000 mg | ORAL_TABLET | Freq: Every day | ORAL | Status: DC
  Administered 2014-07-09 – 2014-07-10 (×2): 50 mg via ORAL
  Filled 2014-07-09 (×2): qty 1

## 2014-07-09 MED ORDER — SODIUM CHLORIDE 0.9 % IV BOLUS (SEPSIS)
1000.0000 mL | Freq: Once | INTRAVENOUS | Status: AC
  Administered 2014-07-09: 1000 mL via INTRAVENOUS

## 2014-07-09 MED ORDER — PHENYLEPHRINE 40 MCG/ML (10ML) SYRINGE FOR IV PUSH (FOR BLOOD PRESSURE SUPPORT)
PREFILLED_SYRINGE | INTRAVENOUS | Status: AC
  Filled 2014-07-09: qty 10

## 2014-07-09 MED ORDER — LIDOCAINE HCL (CARDIAC) 20 MG/ML IV SOLN
INTRAVENOUS | Status: AC
  Filled 2014-07-09: qty 5

## 2014-07-09 MED ORDER — PHENYLEPHRINE HCL 10 MG/ML IJ SOLN
INTRAMUSCULAR | Status: DC | PRN
  Administered 2014-07-09: 40 ug via INTRAVENOUS

## 2014-07-09 MED ORDER — MIDAZOLAM HCL 2 MG/2ML IJ SOLN
INTRAMUSCULAR | Status: AC
  Filled 2014-07-09: qty 2

## 2014-07-09 MED ORDER — PROPOFOL INFUSION 10 MG/ML OPTIME
INTRAVENOUS | Status: DC | PRN
  Administered 2014-07-09: 120 ug/kg/min via INTRAVENOUS

## 2014-07-09 SURGICAL SUPPLY — 22 items

## 2014-07-09 NOTE — ED Provider Notes (Signed)
CSN: [id number]     Arrival date & time 07/09/14  0130 History   First MD Initiated Contact with Patient 07/09/14 [phone]     Chief Complaint  Patient presents with  . Rectal Bleeding     (Consider location/radiation/quality/duration/timing/severity/associated sxs/prior Treatment) The history is provided by the patient and medical records.   This is a 68 year old male with past medical history significant for hypertension, insomnia, presenting to the ED for rectal bleeding. Patient had a colonoscopy yesterday with Dr. Fuller Plan.  He states procedure went well, no noted complications and was discharged home. States around 1800 he felt the need to have a BM but went to the bathroom and had rectal bleeding.  States bleeding continued intermittently throughout the remainder of the evening, but has been persistent since around midnight.  States has not had a BM since procedure, only bleeding.  Denies current abdominal pain, nausea, vomiting.  No dizziness or lightheadedness.  Patient had colonoscopy performed 3 years ago with polypectomy, no complications with that procedure.  Patient not currently on any anti-coagulants.  VS stable on arrival.  Past Medical History  Diagnosis Date  . Hypertension   . Insomnia   . Thoracic ascending aortic aneurysm 05/07/2013   Past Surgical History  Procedure Laterality Date  . Appendectomy    . Hernia repair    . Rotator cuff repair    . Excisional hemorrhoidectomy     Family History  Problem Relation Age of Onset  . Cirrhosis Father   . Lung cancer Mother   . Colon cancer Neg Hx   . Pancreatic cancer Neg Hx   . Rectal cancer Neg Hx   . Stomach cancer Neg Hx    History  Substance Use Topics  . Smoking status: Never Smoker   . Smokeless tobacco: Never Used  . Alcohol Use: Yes     Comment: beer 2-4 daily    Review of Systems  Gastrointestinal: Positive for anal bleeding.  All other systems reviewed and are negative.     Allergies   Lisinopril  Home Medications   Prior to Admission medications   Medication Sig Start Date End Date Taking? Authorizing Provider  hydrochlorothiazide (HYDRODIURIL) 25 MG tablet Take 12.5 mg by mouth daily.    Historical Provider, MD  losartan (COZAAR) 100 MG tablet Take 50 mg by mouth daily.    Historical Provider, MD  ranitidine (ZANTAC) 150 MG tablet Take 150 mg by mouth 2 (two) times daily.    Historical Provider, MD  sildenafil (VIAGRA) 100 MG tablet Take 100 mg by mouth daily as needed for erectile dysfunction.    Historical Provider, MD  traZODone (DESYREL) 50 MG tablet Take 50 mg by mouth at bedtime.    Historical Provider, MD   BP 132/87  Pulse 88  Temp(Src) 97.7 F (36.5 C) (Oral)  Resp 13  SpO2 98% Physical Exam  Nursing note and vitals reviewed. Constitutional: He is oriented to person, place, and time. He appears well-developed and well-nourished. No distress.  HENT:  Head: Normocephalic and atraumatic.  Mouth/Throat: Oropharynx is clear and moist.  Eyes: Conjunctivae and EOM are normal. Pupils are equal, round, and reactive to light.  Neck: Normal range of motion. Neck supple.  Cardiovascular: Normal rate, regular rhythm and normal heart sounds.   Pulmonary/Chest: Effort normal and breath sounds normal. No respiratory distress. He has no wheezes.  Abdominal: Soft. Bowel sounds are normal. There is no tenderness. There is no guarding.  Abdomen soft, nondistended, no focal tenderness  or peritoneal signs  Genitourinary: Guaiac positive stool.  BRBPR noted  Musculoskeletal: Normal range of motion.  Neurological: He is alert and oriented to person, place, and time.  Skin: Skin is warm and dry. He is not diaphoretic.  Psychiatric: He has a normal mood and affect.    ED Course  Procedures (including critical care time) Labs Review Labs Reviewed  COMPREHENSIVE METABOLIC PANEL - Abnormal; Notable for the following:    Sodium 134 (*)    Potassium 3.3 (*)    Glucose,  Bld 113 (*)    Total Bilirubin 1.4 (*)    GFR calc non Af Amer 88 (*)    Anion gap 16 (*)    All other components within normal limits  CBC  COMPREHENSIVE METABOLIC PANEL  MAGNESIUM  CBC  CBC  TYPE AND SCREEN  ABO/RH    Imaging Review No results found.   EKG Interpretation None      MDM   Final diagnoses:  Rectal bleeding   68 year old male status post colonoscopy and polypectomy yesterday by Dr. Fuller Plan.  He presents with complaint of gross rectal bleeding. On exam, patient in no acute distress. His abdominal exam is benign. He does have noted bright red blood per rectum. Hemoglobin is stable.  Patient has no current complaints and is hemodynamically stable. Have discussed with [facility]-- recommend overnight observation, liquid diet, repeat hemoglobin in the morning. If continues to have persistent bleeding will need repeat colonoscopy to rule out perforation. Case discussed with hospitalist, Dr. Ernestina Patches who has evaluated patient in the ED and agreed to admit.  Larene Pickett, PA-C 07/09/14 [phone]

## 2014-07-09 NOTE — Telephone Encounter (Signed)
  Follow up Call-  Call back number 07/08/2014  Post procedure Call Back phone  # [phone]  Permission to leave phone message Yes    Spoke with pt's wife who states pt was taken to ER and was admitted last night d/t rectal bleeding.  She states ER doctor made on call MD aware.  I told her that either Dr. Fuller Plan or hospital MD would be to see Richard Irwin today

## 2014-07-09 NOTE — ED Notes (Signed)
CBC shown collected at this time was a discontinued order.

## 2014-07-09 NOTE — ED Provider Notes (Signed)
Medical screening examination/treatment/procedure(s) were performed by non-physician practitioner and as supervising physician I was immediately available for consultation/collaboration.   EKG Interpretation None       Kalman Drape, MD 07/09/14 [phone]

## 2014-07-09 NOTE — Anesthesia Postprocedure Evaluation (Signed)
  Anesthesia Post-op Note  Patient: Richard Irwin  Procedure(s) Performed: Procedure(s) (LRB): COLONOSCOPY WITH PROPOFOL (N/A)  Patient Location: PACU  Anesthesia Type: MAC  Level of Consciousness: awake and alert   Airway and Oxygen Therapy: Patient Spontanous Breathing  Post-op Pain: mild  Post-op Assessment: Post-op Vital signs reviewed, Patient's Cardiovascular Status Stable, Respiratory Function Stable, Patent Airway and No signs of Nausea or vomiting  Last Vitals:  Filed Vitals:   07/09/14 1340  BP: 116/81  Pulse: 66  Temp:   Resp: 19    Post-op Vital Signs: stable   Complications: No apparent anesthesia complications

## 2014-07-09 NOTE — ED Notes (Signed)
Pt reports having colonoscopy and had 6 polyps removed yesterday at 1430, started to have rectal bleeding at 1830.  Pt reports actively bleeding even when he's not having a BM.  Pt reports mild dizziness.

## 2014-07-09 NOTE — Progress Notes (Signed)
Patient brought to Recovery from Colonoscopy. Patient stable but with c/o of needing to void. Patient requested to stand to void. RN assisted patient with standing. While patient standing, not able to void and passed moderate amount of light pink blood with a few clots from rectum. Patient started to feel dizzy and lightheaded and "not right". Patient assisted to lay back down and continued to be monitored. MD for anesthesia and GI MD notified. Patient's VSS, heart rate in mid 50's in sinus bradycardia.  Orders placed. Will continue to monitor.

## 2014-07-09 NOTE — ED Notes (Addendum)
Pt reports that the rectal bleeding is dark red. He denies dizziness, denies pain, nausea or vomiting. He reports no diarrhea only blood. Gross blood noted to rectum.

## 2014-07-09 NOTE — Anesthesia Preprocedure Evaluation (Addendum)
Anesthesia Evaluation  Patient identified by MRN, date of birth, ID band Patient awake    Reviewed: Allergy & Precautions, H&P , NPO status , Patient's Chart, lab work & pertinent test results  Airway Mallampati: II TM Distance: >3 FB Neck ROM: Full    Dental no notable dental hx.    Pulmonary neg pulmonary ROS,  breath sounds clear to auscultation  Pulmonary exam normal       Cardiovascular hypertension, Pt. on medications + Peripheral Vascular Disease Rhythm:Regular Rate:Normal  TAAA   Neuro/Psych negative neurological ROS  negative psych ROS   GI/Hepatic negative GI ROS, Neg liver ROS,   Endo/Other  negative endocrine ROS  Renal/GU negative Renal ROS  negative genitourinary   Musculoskeletal negative musculoskeletal ROS (+)   Abdominal   Peds negative pediatric ROS (+)  Hematology negative hematology ROS (+)   Anesthesia Other Findings   Reproductive/Obstetrics negative OB ROS                         Anesthesia Physical Anesthesia Plan  ASA: III  Anesthesia Plan: MAC   Post-op Pain Management:    Induction: Intravenous  Airway Management Planned: Nasal Cannula  Additional Equipment:   Intra-op Plan:   Post-operative Plan: Extubation in OR  Informed Consent: I have reviewed the patients History and Physical, chart, labs and discussed the procedure including the risks, benefits and alternatives for the proposed anesthesia with the patient or authorized representative who has indicated his/her understanding and acceptance.   Dental advisory given  Plan Discussed with: CRNA and Surgeon  Anesthesia Plan Comments:         Anesthesia Quick Evaluation

## 2014-07-09 NOTE — Progress Notes (Signed)
Pt given 2 tap water enemas per MD orders for colonoscopy prep. Pt able to tolerate about 600cc at time. This was given two times. Pt had 3 large watery stools that were dark and bright red with many clots seen. Pt tolerated procedure well. Consent for procedure is signed and on the chart. Will continue to monitor pt.  Othella Boyer [facility] 07/09/2014 10:31 AM

## 2014-07-09 NOTE — Consult Note (Signed)
Referring Provider: No ref. provider found Primary Care Physician:   Melinda Crutch, MD Primary Gastroenterologist:  Dr. Fuller Plan  Reason for Consultation:  GI bleed post colonoscopy  HPI: Richard Irwin is a 69 y.o. male with significant past medical history of HTN and thoracic aortic aneurysm.  He had a colonoscopy by Dr. Fuller Plan yesterday, 10/20, showed a sessile polyp at the cecum; polypectomy performed with cold forceps, sessile polyp in the ascending colon; polypectomy performed with a cold snare, three sessile polyps in the transverse colon; polypectomy performed with cold forceps, and a pedunculated polyp in the descending colon; polypectomy performed using snare cautery.  He also had mild diverticulosis in the sigmoid colon.  Recommendations were made to hold Aspirin and all other NSAIDS for 2 weeks and repeat colonoscopy in 3 years.  He started having bleeding last night around 630 pm.  Bleeding even without stool/BM.  Woke up around 1230 am today in a pool of blood in his bed.  Denies taking any ASA or NSAID's.  Says that he only ate chicken noodle soup and did not do anything strenuous.  He denies any abdominal pain and otherwise says that he feels fine.  Had 4 episodes of large amounts of liquid red blood since 6 AM today.  Hgb was stable at 14.7 grams/his baseline on admission very early this morning and repeat labs are ordered but have not yet been drawn.     Past Medical History  Diagnosis Date  . Hypertension   . Insomnia   . Thoracic ascending aortic aneurysm 05/07/2013    Past Surgical History  Procedure Laterality Date  . Appendectomy    . Hernia repair    . Rotator cuff repair    . Excisional hemorrhoidectomy      Prior to Admission medications   Medication Sig Start Date End Date Taking? Authorizing Provider  hydrochlorothiazide (HYDRODIURIL) 25 MG tablet Take 12.5 mg by mouth daily.   Yes Historical Provider, MD  losartan (COZAAR) 100 MG tablet Take 50 mg by mouth  daily.   Yes Historical Provider, MD  ranitidine (ZANTAC) 150 MG tablet Take 150 mg by mouth 2 (two) times daily as needed for heartburn.    Yes Historical Provider, MD  sildenafil (VIAGRA) 100 MG tablet Take 100 mg by mouth daily as needed for erectile dysfunction.   Yes Historical Provider, MD  traZODone (DESYREL) 50 MG tablet Take 50 mg by mouth at bedtime.   Yes Historical Provider, MD    Current Facility-Administered Medications  Medication Dose Route Frequency Provider Last Rate Last Dose  . 0.9 % NaCl with KCl 20 mEq/ L  infusion   Intravenous Continuous Shanda Howells, MD 100 mL/hr at 07/09/14 0447    . losartan (COZAAR) tablet 50 mg  50 mg Oral Daily Shanda Howells, MD        Allergies as of 07/09/2014 - Review Complete 07/09/2014  Allergen Reaction Noted  . Lisinopril Cough 05/06/2013    Family History  Problem Relation Age of Onset  . Cirrhosis Father   . Lung cancer Mother   . Colon cancer Neg Hx   . Pancreatic cancer Neg Hx   . Rectal cancer Neg Hx   . Stomach cancer Neg Hx     History   Social History  . Marital Status: Married    Spouse Name: N/A    Number of Children: 1  . Years of Education: N/A   Occupational History  . retired Press photographer  Social History Main Topics  . Smoking status: Never Smoker   . Smokeless tobacco: Never Used  . Alcohol Use: Yes     Comment: beer 2-4 daily  . Drug Use: No  . Sexual Activity: Not on file   Other Topics Concern  . Not on file   Social History Narrative  . No narrative on file    Review of Systems: Ten point ROS is O/W negative except as mentioned in HPI.  Physical Exam: Vital signs in last 24 hours: Temp:  [97.7 F (36.5 C)-98.7 F (37.1 C)] 97.8 F (36.6 C) (10/21 0436) Pulse Rate:  [61-112] 61 (10/21 0436) Resp:  [11-21] 16 (10/21 0436) BP: (108-168)/(78-108) 125/92 mmHg (10/21 0436) SpO2:  [94 %-99 %] 98 % (10/21 0436) Weight:  [178 lb 12.7 oz (81.1 kg)-182 lb (82.555 kg)] 178 lb 12.7 oz (81.1 kg)  (10/21 0424) Last BM Date: 07/09/14 General:  Alert, Well-developed, well-nourished, pleasant and cooperative in NAD Head:  Normocephalic and atraumatic. Eyes:  Sclera clear, no icterus.  Conjunctiva pink. Ears:  Normal auditory acuity. Mouth:  No deformity or lesions.   Lungs:  Clear throughout to auscultation.  No wheezes, crackles, or rhonchi.  Heart:  Regular rate and rhythm; no M/R/G. Abdomen:  Soft, non-distended.  BS present and hyperactive.  Non-tender.   Rectal:  Deferred  Msk:  Symmetrical without gross deformities. Pulses:  Normal pulses noted. Extremities:  Without clubbing or edema. Neurologic:  Alert and  oriented x4;  grossly normal neurologically. Skin:  Intact without significant lesions or rashes. Psych:  Alert and cooperative. Normal mood and affect.  Intake/Output from previous day: 10/20 0701 - 10/21 0700 In: 58.3 [I.V.:58.3] Out: 300 [Urine:300] Intake/Output this shift: Total I/O In: -  Out: 200 [Urine:200]  Lab Results:  Recent Labs  07/09/14 0208  WBC 7.0  HGB 14.7  HCT 43.6  PLT 209   BMET  Recent Labs  07/09/14 0208  NA 134*  K 3.3*  CL 98  CO2 20  GLUCOSE 113*  BUN 8  CREATININE 0.85  CALCIUM 9.2   LFT  Recent Labs  07/09/14 0208  PROT 7.2  ALBUMIN 3.8  AST 24  ALT 15  ALKPHOS 52  BILITOT 1.4*   IMPRESSION:  -Gastrointestinal bleeding:  Most likely post-polypectomy bleed from colonoscopy yesterday, 10/20.  PLAN: -Will give 2 tap water enemas and plan for repeat colonoscopy today for identification of bleeding site and control of bleeding. -Monitor Hgb.   ZEHR, JESSICA D.  07/09/2014, 9:03 AM  Pager number [phone]  GI ATTENDING  Post polypectomy bleed. Suspect left colon lesion (large and removed with cautery). Due to ongoing bleeding, will perform colonoscopy with hemostatic therapy.The nature of the procedure, as well as the risks, benefits, and alternatives were carefully and thoroughly reviewed with the  patient. Ample time for discussion and questions allowed. The patient understood, was satisfied, and agreed to proceed.  Docia Chuck. Geri Seminole., M.D. [facility] Division of Gastroenterology

## 2014-07-09 NOTE — H&P (Signed)
Hospitalist Admission History and Physical  Patient name: Richard Irwin Medical record number: [id number] Date of birth: 1946/06/26 Age: 68 y.o. Gender: male  Primary Care Provider:  Melinda Crutch, MD  Chief Complaint: GIB  History of Present Illness:This is a 68 y.o. year old male with significant past medical history of HTN, thoracic aortic aneurysm, colonic polyps  presenting with GIB. Pt went for elective outpt colonoscopy yesterday via Dr. [facility]. States that he had several polyps removed at the time. States that within 3-4 hours of the procedure, he developed frank BRBPR that has persisted for several hours. Pt states that he had a colonoscopy 4-5 years ago with polyp removal that had no complications. Denies any strenuous activity. No recent NSAID, ASA use.  Presented to ER hemodynamically stable, afebrile. Hgb 14.7. bloodwork WNL apart from K 3.3. ED PA spoke with GI. Rec inpt obs with am consult. May need repeat colonoscopy if bleeding doesn't resolve w/in next 24 hours.   Assessment and Plan: Richard Irwin is a 68 y.o. year old male presenting with GIB   Active Problems:   GIB (gastrointestinal bleeding)   1-GIB -NPO for now -serial CBCs -f/u GI recs   2-HTN -BP stable  -cont home regimen    FEN/GI: NPO. Replete K. Mag level Prophylaxis: SCDs Disposition: pending further evaluation  Code Status:Full Code    Patient Active Problem List   Diagnosis Date Noted  . GIB (gastrointestinal bleeding) 07/09/2014  . Loss of weight 08/04/2013  . Dyspnea on exertion 08/04/2013  . Thoracic ascending aortic aneurysm 05/07/2013  . Aortic root dilation 05/06/2013  . HYPERTENSION 09/12/2007   Past Medical History: Past Medical History  Diagnosis Date  . Hypertension   . Insomnia   . Thoracic ascending aortic aneurysm 05/07/2013    Past Surgical History: Past Surgical History  Procedure Laterality Date  . Appendectomy    . Hernia repair    . Rotator cuff  repair    . Excisional hemorrhoidectomy      Social History: History   Social History  . Marital Status: Married    Spouse Name: N/A    Number of Children: 1  . Years of Education: N/A   Occupational History  . retired Press photographer    Social History Main Topics  . Smoking status: Never Smoker   . Smokeless tobacco: Never Used  . Alcohol Use: Yes     Comment: beer 2-4 daily  . Drug Use: No  . Sexual Activity: None   Other Topics Concern  . None   Social History Narrative  . None    Family History: Family History  Problem Relation Age of Onset  . Cirrhosis Father   . Lung cancer Mother   . Colon cancer Neg Hx   . Pancreatic cancer Neg Hx   . Rectal cancer Neg Hx   . Stomach cancer Neg Hx     Allergies: Allergies  Allergen Reactions  . Lisinopril Cough    Current Facility-Administered Medications  Medication Dose Route Frequency Provider Last Rate Last Dose  . 0.9 % NaCl with KCl 20 mEq/ L  infusion   Intravenous Continuous Shanda Howells, MD       Current Outpatient Prescriptions  Medication Sig Dispense Refill  . hydrochlorothiazide (HYDRODIURIL) 25 MG tablet Take 12.5 mg by mouth daily.      Marland Kitchen losartan (COZAAR) 100 MG tablet Take 50 mg by mouth daily.      . ranitidine (ZANTAC) 150 MG tablet Take  150 mg by mouth 2 (two) times daily as needed for heartburn.       . sildenafil (VIAGRA) 100 MG tablet Take 100 mg by mouth daily as needed for erectile dysfunction.      . traZODone (DESYREL) 50 MG tablet Take 50 mg by mouth at bedtime.       Review Of Systems: 12 point ROS negative except as noted above in HPI.  Physical Exam: Filed Vitals:   07/09/14 0209  BP: 132/87  Pulse: 88  Temp:   Resp: 13    General: alert and cooperative HEENT: PERRLA and extra ocular movement intact Heart: S1, S2 normal, no murmur, rub or gallop, regular rate and rhythm Lungs: clear to auscultation, no wheezes or rales and unlabored breathing Abdomen: abdomen is soft without  significant tenderness, masses, organomegaly or guarding Extremities: extremities normal, atraumatic, no cyanosis or edema Skin:no rashes, no ecchymoses Neurology: normal without focal findings  Labs and Imaging: Lab Results  Component Value Date/Time   NA 134* 07/09/2014  2:08 AM   K 3.3* 07/09/2014  2:08 AM   CL 98 07/09/2014  2:08 AM   CO2 20 07/09/2014  2:08 AM   BUN 8 07/09/2014  2:08 AM   CREATININE 0.85 07/09/2014  2:08 AM   CREATININE 0.90 05/02/2013 10:24 AM   GLUCOSE 113* 07/09/2014  2:08 AM   Lab Results  Component Value Date   WBC 7.0 07/09/2014   HGB 14.7 07/09/2014   HCT 43.6 07/09/2014   MCV 98.4 07/09/2014   PLT 209 07/09/2014    No results found.         Shanda Howells MD  Pager: [phone]

## 2014-07-09 NOTE — Op Note (Signed)
[facility] [place], [id number]   COLONOSCOPY PROCEDURE REPORT  PATIENT: Richard Irwin, Richard Irwin  MR#: [id number] BIRTHDATE: 1945-12-23 , 68  yrs. old GENDER: male ENDOSCOPIST: Eustace Quail, MD REFERRED SH:[facility] PROCEDURE DATE:  07/09/2014 PROCEDURE:   Colonoscopy with control of bleeding (Endo Clip x3) and Submucosal injection, (epinephrine) First Screening Colonoscopy - Avg.  risk and is 50 yrs.  old or older - No.  Prior Negative Screening - Now for repeat screening. N/A  History of Adenoma - Now for follow-up colonoscopy & has been > or = to 3 yrs.  N/A  Polyps Removed Today? Yes. ASA CLASS:   Class II INDICATIONS:hematochezia.. Colonoscopy with multiple polypectomy yesterday. VigorousGI bleeding persisting overnight MEDICATIONS: Monitored anesthesia care and Per Anesthesia  DESCRIPTION OF PROCEDURE:   After the risks benefits and alternatives of the procedure were thoroughly explained, informed consent was obtained.  The digital rectal exam revealed no abnormalities of the rectum.   The EC-[id number] ([id number])  endoscope was introduced through the anus and advanced to the cecum, which was identified by the ileocecal valve. No adverse events experienced.   The quality of the prep was adequate, using tapwater Enemas  The instrument was then slowly withdrawn as the colon was fully examined.  COLON FINDINGS: The colonoscope was advanced to the ileocecal valve. There was large volumes of fresh blood throughout the entire colon.  Vigorous irrigation and suctioning performed.  The prior polypectomy site in the descending colon was identified with active bleeding.  The lesion was injected with 1-10,000 concentration epinephrine.  Initial injections were on the proximal side to raise the defect toward the endoscopist.  A total of 6 cc was injected around and in the lesion.  The bleeding slowed.  Endo Clip x3 were placed across the deficit  including and identified visible vessel. Hemostasis achieved.  Left-sided diverticulosis noted. Retroflexion was not performed. The time to cecum=mi.  Withdrawal time=mi.  The scope was withdrawn and the procedure completed. COMPLICATIONS: There were no immediate complications.  ENDOSCOPIC IMPRESSION: 1. Post polypectomy bleeding status post colonoscopy with endoscopic hemostatic therapy with epinephrine injection followed by Endo Clip placement. Hemostasis achieved 2. Incidental left-sided diverticulosis  RECOMMENDATIONS: 1. Return to hospital for monitoring and close observation. Clear liquid diet. 2. CBC in a.m. Sooner if needed  eSigned:  Eustace Quail, MD 07/09/2014 12:42 PM   cc: Kathlene November, MD; Lucio Edward, M.D. and The Patient

## 2014-07-09 NOTE — ED Notes (Signed)
PA  Lisa at bedside.

## 2014-07-09 NOTE — Progress Notes (Signed)
[facility] PROGRESS NOTE  Richard Irwin OVF:[id number] DOB: 1945/12/13 DOA: 07/09/2014 PCP:  Melinda Crutch, MD  Assessment/Plan  Post polypectomy bleeding.  patient underwent colonoscopy with endoscopic hemostatic therapy with epinephrine injection and clipping on 10/21. Hemostasis was achieved. He was incidentally found to have left-sided diverticulosis. -  Observe overnight -  Repeat CBC in a.m. or sooner if active bleeding -  Appreciate GI assistance  Hypertension with elevated blood pressures -  Continue losartan -  Continue to hold hydrochlorothiazide  Mild sinus bradycardia postoperatively -  Monitor on telemetry -  GI requested the step down unit bed do to decrease in hemoglobin, he should feeling unwell, and new onset mild bradycardia  Hypokalemia, likely due to chlorothiazide, inadequate oral intake secondary to n.p.o. from GI bleed -  Supplement orally and with IV fluids  Diet:  Clear liquid diet Access:  PIV IVF:  Yes Proph:  SCDs  Code Status:  Full Family Communication:  Patient alone Disposition Plan:  Pending stable CBC and resolving bleeding   Consultants:   Gastroenterology, Doctor Henrene Pastor  Procedures:   Colonoscopy on 10/21 with Endo Clip x3 and epinephrine injection  Antibiotics:   None   HPI/Subjective:  Patient states that he had 3 bowel movements admission age of which had blood. He denied abdominal pain.    Objective: Filed Vitals:   07/09/14 1440 07/09/14 1450 07/09/14 1500 07/09/14 1510  BP: 134/116 122/86 144/90 144/89  Pulse: 80 84 86 90  Temp:      TempSrc:      Resp: 16 23 15 16   Height:      Weight:      SpO2: 100% 99% 98% 99%    Intake/Output Summary (Last 24 hours) at 07/09/14 1643 Last data filed at 07/09/14 1213  Gross per 24 hour  Intake 1078.33 ml  Output    600 ml  Net 478.33 ml   Filed Weights   07/09/14 0424  Weight: 81.1 kg (178 lb 12.7 oz)    Exam:   General:  WM, No acute distress  HEENT:   NCAT, MMM  Cardiovascular:  RRR, nl S1, S2 no mrg, 2+ pulses, warm extremities  Respiratory:  CTAB, no increased WOB  Abdomen:   Hyperactive bowel sounds, soft, NT/ND  MSK:   Normal tone and bulk, no LEE  Neuro:  Grossly intact  Data Reviewed: Basic Metabolic Panel:  Recent Labs Lab 07/09/14 0202 07/09/14 0208  NA  --  134*  K  --  3.3*  CL  --  98  CO2  --  20  GLUCOSE  --  113*  BUN  --  8  CREATININE  --  0.85  CALCIUM  --  9.2  MG 2.1  --    Liver Function Tests:  Recent Labs Lab 07/09/14 0208  AST 24  ALT 15  ALKPHOS 52  BILITOT 1.4*  PROT 7.2  ALBUMIN 3.8   No results found for this basename: LIPASE, AMYLASE,  in the last 168 hours No results found for this basename: AMMONIA,  in the last 168 hours CBC:  Recent Labs Lab 07/09/14 0208 07/09/14 1255  WBC 7.0  --   NEUTROABS 4.0  --   HGB 14.7 12.0*  HCT 43.6 35.0*  MCV 98.4  --   PLT 209  --    Cardiac Enzymes: No results found for this basename: CKTOTAL, CKMB, CKMBINDEX, TROPONINI,  in the last 168 hours BNP (last 3 results) No results found for this  basename: PROBNP,  in the last 8760 hours CBG: No results found for this basename: GLUCAP,  in the last 168 hours  No results found for this or any previous visit (from the past 240 hour(s)).   Studies: No results found.  Scheduled Meds: . losartan  50 mg Oral Daily   Continuous Infusions: . 0.9 % NaCl with KCl 20 mEq / L 100 mL/hr at 07/09/14 1601    Active Problems:   GIB (gastrointestinal bleeding)   Postoperative anemia due to acute blood loss    Time spent: 30 min    Bently Morath, [facility]  [facility] Pager [phone]. If 7PM-7AM, please contact night-coverage at [email or web address], password [facility] 07/09/2014, 4:43 PM  LOS: 0 days

## 2014-07-09 NOTE — Transfer of Care (Signed)
Immediate Anesthesia Transfer of Care Note  Patient: Richard Irwin  Procedure(s) Performed: Procedure(s): COLONOSCOPY WITH PROPOFOL (N/A)  Patient Location: PACU and Endoscopy Unit  Anesthesia Type:MAC  Level of Consciousness: awake, alert , oriented and patient cooperative  Airway & Oxygen Therapy: Patient Spontanous Breathing and Patient connected to face mask oxygen  Post-op Assessment: Report given to PACU RN, Post -op Vital signs reviewed and stable and Patient moving all extremities  Post vital signs: Reviewed and stable  Complications: No apparent anesthesia complications

## 2014-07-10 ENCOUNTER — Encounter (HOSPITAL_COMMUNITY): Payer: Self-pay | Admitting: Internal Medicine

## 2014-07-10 DIAGNOSIS — K625 Hemorrhage of anus and rectum: Secondary | ICD-10-CM

## 2014-07-10 DIAGNOSIS — I10 Essential (primary) hypertension: Secondary | ICD-10-CM

## 2014-07-10 DIAGNOSIS — D62 Acute posthemorrhagic anemia: Secondary | ICD-10-CM

## 2014-07-10 LAB — CBC
HCT: 32.1 % — ABNORMAL LOW (ref 39.0–52.0)
Hemoglobin: 11.1 g/dL — ABNORMAL LOW (ref 13.0–17.0)
MCH: 33.1 pg (ref 26.0–34.0)
MCHC: 34.6 g/dL (ref 30.0–36.0)
MCV: 95.8 fL (ref 78.0–100.0)
Platelets: 170 10*3/uL (ref 150–400)
RBC: 3.35 MIL/uL — AB (ref 4.22–5.81)
RDW: 12.3 % (ref 11.5–15.5)
WBC: 7.4 10*3/uL (ref 4.0–10.5)

## 2014-07-10 MED ORDER — FERROUS SULFATE 325 (65 FE) MG PO TBEC: 325.0000 mg | DELAYED_RELEASE_TABLET | Freq: Every day | ORAL | Status: DC

## 2014-07-10 NOTE — Progress Notes (Signed)
    Progress Note   Subjective  feels great, no further bleeding. Wants to go home   Objective   Vital signs in last 24 hours: Temp:  [97.8 F (36.6 C)-98.3 F (36.8 C)] 98 F (36.7 C) (10/22 0830) Pulse Rate:  [50-102] 72 (10/22 0850) Resp:  [12-27] 13 (10/22 0850) BP: (105-170)/(65-116) 164/101 mmHg (10/22 0850) SpO2:  [96 %-100 %] 100 % (10/22 0850) Last BM Date: 07/09/14 General:    white male in NAD Heart:  Regular rate and rhythm Lungs: Respirations even and unlabored, lungs CTA bilaterally Abdomen:  Soft, nontender and nondistended. Normal bowel sounds. Neurologic:  Alert and oriented,  grossly normal neurologically. Psych:  Cooperative. Normal mood and affect.  Lab Results:  Recent Labs  07/09/14 0208 07/09/14 1255 07/09/14 1541 07/10/14 0318  WBC 7.0  --  10.2 7.4  HGB 14.7 12.0* 12.0* 11.1*  HCT 43.6 35.0* 34.4* 32.1*  PLT 209  --  179 170   BMET  Recent Labs  07/09/14 0208  NA 134*  K 3.3*  CL 98  CO2 20  GLUCOSE 113*  BUN 8  CREATININE 0.85  CALCIUM 9.2   LFT  Recent Labs  07/09/14 0208  PROT 7.2  ALBUMIN 3.8  AST 24  ALT 15  ALKPHOS 52  BILITOT 1.4*     Assessment / Plan:   Postpolypectomy bleed, s/p colonoscopy with control of bleeding yesterday. Hgb down close to a gram from yesterday but still okay at 11.1. No further bleeding. He is stable from GI standpoint to be discharged. Follow up appt made with [place] 07/24/14 at 9:15am   LOS: 1 day   Tye Savoy  07/10/2014, 9:14 AM   GI ATTENDING  Interval history and a reviewed. No further bleeding. Patient for discharge. He will followup with Dr. Fuller Plan who is advised of his progress.  Docia Chuck. Geri Seminole., M.D. [facility] Division of Gastroenterology

## 2014-07-10 NOTE — Progress Notes (Signed)
Patient verbalized understanding of discharge instructions. Patient is stable at discharge. 

## 2014-07-10 NOTE — Discharge Summary (Signed)
Physician Discharge Summary  Richard Irwin FAO:[id number] DOB: 04-28-1946 DOA: 07/09/2014  PCP:  Melinda Crutch, MD  Admit date: 07/09/2014 Discharge date: 07/10/2014  Recommendations for Outpatient Follow-up:  1. F/u with gastroenterology in 2 weeks for repeat CBC and to review results of pathology report  Discharge Diagnoses:  Active Problems:   Essential hypertension   GIB (gastrointestinal bleeding)   Postoperative anemia due to acute blood loss   Hypokalemia   Discharge Condition: stable, improved  Diet recommendation: healthy heart  Wt Readings from Last 3 Encounters:  07/09/14 81.1 kg (178 lb 12.7 oz)  07/09/14 81.1 kg (178 lb 12.7 oz)  07/09/14 81.1 kg (178 lb 12.7 oz)    History of present illness:  The patient is a 68 yo male with HTN, thoracic aortic aneurysm, colonic polyps who presented 1 day after elective colonoscopy with polypectomy with rectal bleeding.  And within 4 hours of his procedure he developed frank bright red blood per rectum. He denied recent NSAID or aspirin use. In the emergency department, his hemoglobin was 14.7.    Hospital Course:   Post polypectomy bleeding with acute blood loss anemia. He was seen by gastroenterology and underwent repeat colonoscopy on 10/21 by Doctor Henrene Pastor. There was an area of friable mucosa biopsy site and he underwent endoscopic hemostatic therapy with epinephrine injection and clipping. Hemostasis was achieved. He was incidentally found to have left-sided diverticulosis. He was observed overnight and his hemoglobin trended down from 12-11.1 mg/dL. He did not have further rectal bleeding after his procedure.  Hypertension with elevated blood pressures. He continued to start and his hydrochlorothiazide was held because of his ongoing bleeding initially. He may resume his home blood pressure medications.  Mild sinus bradycardia with present preoperatively by pronounced postoperatively. He had heart rate in the 50s after his  colonoscopy in 10/21. He was monitored on telemetry and continued to be in normal sinus rhythm with heart rate in the 50s to 70s.  He is not on any AV nodal blocking medications. He remained asymptomatic.  Hypokalemia, likely due to HCTZ. He was given supplemental potassium by mouth and IV fluids.  Consultants:  Gastroenterology, Doctor Henrene Pastor (Primary is Dr. Fuller Plan) Procedures:  Colonoscopy on 10/21 with Endo Clip x3 and epinephrine injection by Dr. Henrene Pastor Antibiotics:  None    Discharge Exam: Filed Vitals:   07/10/14 0340  BP: 150/92  Pulse: 76  Temp: 98.3 F (36.8 C)  Resp: 13   Filed Vitals:   07/09/14 2000 07/09/14 2010 07/10/14 0000 07/10/14 0340  BP:  153/78 142/88 150/92  Pulse:  86 73 76  Temp: 98.3 F (36.8 C)  98.3 F (36.8 C) 98.3 F (36.8 C)  TempSrc: Oral  Oral Oral  Resp:  18 15 13   Height:      Weight:      SpO2:  100% 98% 97%    General: WM, No acute distress, lying in bed HEENT: NCAT, MMM  Cardiovascular: RRR, nl S1, S2 no mrg, 2+ pulses, warm extremities  Respiratory: CTAB, no increased WOB  Abdomen: NABS, soft, NT/ND  MSK: Normal tone and bulk, no LEE  Neuro: Grossly intact   Discharge Instructions      Discharge Instructions   Call MD for:  difficulty breathing, headache or visual disturbances    Complete by:  As directed      Call MD for:  extreme fatigue    Complete by:  As directed      Call MD for:  hives  Complete by:  As directed      Call MD for:  persistant dizziness or light-headedness    Complete by:  As directed      Call MD for:  persistant nausea and vomiting    Complete by:  As directed      Call MD for:  severe uncontrolled pain    Complete by:  As directed      Call MD for:  temperature >100.4    Complete by:  As directed      Diet - low sodium heart healthy    Complete by:  As directed      Discharge instructions    Complete by:  As directed   You were hospitalized with rectal bleeding that happened after you had  a polyp removed.  Dr. Henrene Pastor from gastroenterology was able to stop the bleeding.  Please do not use ibuprofen, aspirin, aleve, or goody's powders as these medications can increase your risk of bleeding.  Take iron once daily to help boost your blood stores.  Follow up with gastroenterology Dr. Fuller Plan in about two weeks to have your blood counts rechecked and to review your pathology report from your initial biopsy.  If you have recurrent bleeding, please return to the hospital.     Increase activity slowly    Complete by:  As directed             Medication List         ferrous sulfate 325 (65 FE) MG EC tablet  Take 1 tablet (325 mg total) by mouth daily with breakfast.     hydrochlorothiazide 25 MG tablet  Commonly known as:  HYDRODIURIL  Take 12.5 mg by mouth daily.     losartan 100 MG tablet  Commonly known as:  COZAAR  Take 50 mg by mouth daily.     ranitidine 150 MG tablet  Commonly known as:  ZANTAC  Take 150 mg by mouth 2 (two) times daily as needed for heartburn.     sildenafil 100 MG tablet  Commonly known as:  VIAGRA  Take 100 mg by mouth daily as needed for erectile dysfunction.     traZODone 50 MG tablet  Commonly known as:  DESYREL  Take 50 mg by mouth at bedtime.       Follow-up Information   Follow up with  Melinda Crutch, MD. Schedule an appointment as soon as possible for a visit in 1 month. (As needed)    Specialty:  Family Medicine   Contact information:   [address] [place] [address] [phone]       Follow up with Norberto Sorenson T. Fuller Plan, MD. Schedule an appointment as soon as possible for a visit in 2 weeks.   Specialty:  Gastroenterology   Contact information:   [address] [place] [address] [phone]        The results of significant diagnostics from this hospitalization (including imaging, microbiology, ancillary and laboratory) are listed below for reference.    Significant Diagnostic Studies: No results  found.  Microbiology: Recent Results (from the past 240 hour(s))  MRSA PCR SCREENING     Status: None   Collection Time    07/09/14  4:00 PM      Result Value Ref Range Status   MRSA by PCR NEGATIVE  NEGATIVE Final   Comment:            The GeneXpert MRSA Assay (FDA     approved for  NASAL specimens     only), is one component of a     comprehensive MRSA colonization     surveillance program. It is not     intended to diagnose MRSA     infection nor to guide or     monitor treatment for     MRSA infections.     Performed at [facility]: Basic Metabolic Panel:  Recent Labs Lab 07/09/14 0202 07/09/14 0208  NA  --  134*  K  --  3.3*  CL  --  98  CO2  --  20  GLUCOSE  --  113*  BUN  --  8  CREATININE  --  0.85  CALCIUM  --  9.2  MG 2.1  --    Liver Function Tests:  Recent Labs Lab 07/09/14 0208  AST 24  ALT 15  ALKPHOS 52  BILITOT 1.4*  PROT 7.2  ALBUMIN 3.8   No results found for this basename: LIPASE, AMYLASE,  in the last 168 hours No results found for this basename: AMMONIA,  in the last 168 hours CBC:  Recent Labs Lab 07/09/14 0208 07/09/14 1255 07/09/14 1541 07/10/14 0318  WBC 7.0  --  10.2 7.4  NEUTROABS 4.0  --   --   --   HGB 14.7 12.0* 12.0* 11.1*  HCT 43.6 35.0* 34.4* 32.1*  MCV 98.4  --  96.4 95.8  PLT 209  --  179 170   Cardiac Enzymes: No results found for this basename: CKTOTAL, CKMB, CKMBINDEX, TROPONINI,  in the last 168 hours BNP: BNP (last 3 results) No results found for this basename: PROBNP,  in the last 8760 hours CBG: No results found for this basename: GLUCAP,  in the last 168 hours  Time coordinating discharge: 35 minutes  Signed:  Abdulai Blaylock  [facility] 07/10/2014, 8:10 AM

## 2014-07-14 ENCOUNTER — Encounter: Payer: Self-pay | Admitting: Gastroenterology

## 2014-07-18 ENCOUNTER — Encounter: Payer: Self-pay | Admitting: *Deleted

## 2014-07-24 ENCOUNTER — Other Ambulatory Visit (INDEPENDENT_AMBULATORY_CARE_PROVIDER_SITE_OTHER): Payer: Medicare Other

## 2014-07-24 ENCOUNTER — Encounter: Payer: Self-pay | Admitting: Gastroenterology

## 2014-07-24 ENCOUNTER — Ambulatory Visit (INDEPENDENT_AMBULATORY_CARE_PROVIDER_SITE_OTHER): Payer: Medicare Other | Admitting: Gastroenterology

## 2014-07-24 VITALS — BP 126/90 | HR 104 | Ht 70.75 in | Wt 180.4 lb

## 2014-07-24 DIAGNOSIS — Z86010 Personal history of colonic polyps: Secondary | ICD-10-CM

## 2014-07-24 DIAGNOSIS — D62 Acute posthemorrhagic anemia: Secondary | ICD-10-CM

## 2014-07-24 LAB — CBC WITH DIFFERENTIAL/PLATELET
Basophils Absolute: 0.1 10*3/uL (ref 0.0–0.1)
Basophils Relative: 0.6 % (ref 0.0–3.0)
Eosinophils Absolute: 0 10*3/uL (ref 0.0–0.7)
Eosinophils Relative: 0.4 % (ref 0.0–5.0)
HEMATOCRIT: 43.6 % (ref 39.0–52.0)
Hemoglobin: 14.6 g/dL (ref 13.0–17.0)
LYMPHS ABS: 1.2 10*3/uL (ref 0.7–4.0)
Lymphocytes Relative: 14.3 % (ref 12.0–46.0)
MCHC: 33.6 g/dL (ref 30.0–36.0)
MCV: 99.6 fl (ref 78.0–100.0)
MONO ABS: 0.5 10*3/uL (ref 0.1–1.0)
MONOS PCT: 6.6 % (ref 3.0–12.0)
Neutro Abs: 6.5 10*3/uL (ref 1.4–7.7)
Neutrophils Relative %: 78.1 % — ABNORMAL HIGH (ref 43.0–77.0)
PLATELETS: 265 10*3/uL (ref 150.0–400.0)
RBC: 4.38 Mil/uL (ref 4.22–5.81)
RDW: 13.5 % (ref 11.5–15.5)
WBC: 8.3 10*3/uL (ref 4.0–10.5)

## 2014-07-24 NOTE — Patient Instructions (Signed)
Your physician has requested that you go to the basement for lab work before leaving today. CC:  Richard Crutch MD

## 2014-07-24 NOTE — Progress Notes (Signed)
    History of Present Illness: This is a 68 year old male returning after hospitalization for a post polypectomy bleed, descending colon site. Repeat colonoscopy achieved hemostasis with epinephrine injection and Endo Clip placement. He did not require a transfusion. He had an uneventful hospital stay after that. He has noted slight fatigue but has no gastrointestinal complaints. He notes well formed brown bowel movements, a good appetite and no abdominal pain.  Current Medications, Allergies, Past Medical History, Past Surgical History, Family History and Social History were reviewed in [employer] record.  Physical Exam: General: Well developed , well nourished, no acute distress Head: Normocephalic and atraumatic Eyes:  sclerae anicteric, EOMI Ears: Normal auditory acuity Mouth: No deformity or lesions Lungs: Clear throughout to auscultation Heart: Regular rate and rhythm; no murmurs, rubs or bruits Abdomen: Soft, non tender and non distended. No masses, hepatosplenomegaly or hernias noted. Normal Bowel sounds Musculoskeletal: Symmetrical with no gross deformities  Extremities: No clubbing, cyanosis, edema or deformities noted Neurological: Alert oriented x 4, grossly nonfocal Psychological:  Alert and cooperative. Normal mood and affect  Assessment and Recommendations:  1. Post polypectomy bleed treated endoscopically with hemostasis achieved.   2. Post hemorrhagic anemia. Repeat CBC today.  3. Personal history of adenomatous colon polyps. 3 year surveillance colonoscopy recommended.

## 2014-08-06 ENCOUNTER — Encounter: Payer: Medicare Other | Admitting: Gastroenterology

## 2014-10-02 ENCOUNTER — Encounter (HOSPITAL_COMMUNITY): Payer: Self-pay | Admitting: Internal Medicine

## 2015-03-16 ENCOUNTER — Other Ambulatory Visit: Payer: Self-pay

## 2015-04-15 ENCOUNTER — Encounter: Payer: Self-pay | Admitting: Gastroenterology

## 2017-07-17 ENCOUNTER — Encounter: Payer: Self-pay | Admitting: Gastroenterology

## 2018-11-15 ENCOUNTER — Other Ambulatory Visit: Payer: Self-pay | Admitting: Family Medicine

## 2018-11-15 DIAGNOSIS — R69 Illness, unspecified: Principal | ICD-10-CM

## 2018-11-15 DIAGNOSIS — R198 Other specified symptoms and signs involving the digestive system and abdomen: Secondary | ICD-10-CM

## 2018-11-29 ENCOUNTER — Other Ambulatory Visit: Payer: Self-pay | Admitting: Family Medicine

## 2018-11-29 DIAGNOSIS — R69 Illness, unspecified: Principal | ICD-10-CM

## 2018-11-29 DIAGNOSIS — R198 Other specified symptoms and signs involving the digestive system and abdomen: Secondary | ICD-10-CM

## 2019-07-16 ENCOUNTER — Encounter: Payer: Self-pay | Admitting: Gastroenterology

## 2019-08-26 ENCOUNTER — Ambulatory Visit: Payer: Self-pay | Admitting: Gastroenterology

## 2019-10-23 ENCOUNTER — Inpatient Hospital Stay (HOSPITAL_COMMUNITY)
Admission: EM | Admit: 2019-10-23 | Discharge: 2019-10-29 | DRG: 432 | Disposition: A | Discharge status: Home / Self Care | Payer: No Typology Code available for payment source | Attending: Internal Medicine | Admitting: Internal Medicine

## 2019-10-23 ENCOUNTER — Other Ambulatory Visit: Payer: Self-pay

## 2019-10-23 ENCOUNTER — Encounter (HOSPITAL_COMMUNITY): Payer: Self-pay | Admitting: Obstetrics and Gynecology

## 2019-10-23 ENCOUNTER — Emergency Department (HOSPITAL_COMMUNITY): Payer: No Typology Code available for payment source

## 2019-10-23 DIAGNOSIS — Z781 Physical restraint status: Secondary | ICD-10-CM

## 2019-10-23 DIAGNOSIS — D689 Coagulation defect, unspecified: Secondary | ICD-10-CM | POA: Diagnosis present

## 2019-10-23 DIAGNOSIS — E43 Unspecified severe protein-calorie malnutrition: Secondary | ICD-10-CM | POA: Diagnosis not present

## 2019-10-23 DIAGNOSIS — F1010 Alcohol abuse, uncomplicated: Secondary | ICD-10-CM | POA: Diagnosis present

## 2019-10-23 DIAGNOSIS — E871 Hypo-osmolality and hyponatremia: Secondary | ICD-10-CM | POA: Diagnosis not present

## 2019-10-23 DIAGNOSIS — J449 Chronic obstructive pulmonary disease, unspecified: Secondary | ICD-10-CM | POA: Diagnosis present

## 2019-10-23 DIAGNOSIS — R55 Syncope and collapse: Secondary | ICD-10-CM | POA: Diagnosis not present

## 2019-10-23 DIAGNOSIS — K7010 Alcoholic hepatitis without ascites: Secondary | ICD-10-CM | POA: Diagnosis not present

## 2019-10-23 DIAGNOSIS — R634 Abnormal weight loss: Secondary | ICD-10-CM

## 2019-10-23 DIAGNOSIS — E876 Hypokalemia: Secondary | ICD-10-CM | POA: Diagnosis not present

## 2019-10-23 DIAGNOSIS — G9341 Metabolic encephalopathy: Secondary | ICD-10-CM | POA: Diagnosis not present

## 2019-10-23 DIAGNOSIS — Z86010 Personal history of colonic polyps: Secondary | ICD-10-CM

## 2019-10-23 DIAGNOSIS — D696 Thrombocytopenia, unspecified: Secondary | ICD-10-CM | POA: Diagnosis present

## 2019-10-23 DIAGNOSIS — Z801 Family history of malignant neoplasm of trachea, bronchus and lung: Secondary | ICD-10-CM

## 2019-10-23 DIAGNOSIS — K819 Cholecystitis, unspecified: Secondary | ICD-10-CM

## 2019-10-23 DIAGNOSIS — F10231 Alcohol dependence with withdrawal delirium: Secondary | ICD-10-CM | POA: Diagnosis not present

## 2019-10-23 DIAGNOSIS — E440 Moderate protein-calorie malnutrition: Secondary | ICD-10-CM | POA: Insufficient documentation

## 2019-10-23 DIAGNOSIS — Z20822 Contact with and (suspected) exposure to covid-19: Secondary | ICD-10-CM | POA: Diagnosis present

## 2019-10-23 DIAGNOSIS — I472 Ventricular tachycardia: Secondary | ICD-10-CM | POA: Diagnosis present

## 2019-10-23 DIAGNOSIS — Z85118 Personal history of other malignant neoplasm of bronchus and lung: Secondary | ICD-10-CM

## 2019-10-23 DIAGNOSIS — Z6823 Body mass index (BMI) 23.0-23.9, adult: Secondary | ICD-10-CM

## 2019-10-23 DIAGNOSIS — R17 Unspecified jaundice: Secondary | ICD-10-CM

## 2019-10-23 DIAGNOSIS — R945 Abnormal results of liver function studies: Secondary | ICD-10-CM | POA: Diagnosis present

## 2019-10-23 DIAGNOSIS — F1029 Alcohol dependence with unspecified alcohol-induced disorder: Secondary | ICD-10-CM

## 2019-10-23 DIAGNOSIS — I6523 Occlusion and stenosis of bilateral carotid arteries: Secondary | ICD-10-CM | POA: Diagnosis present

## 2019-10-23 DIAGNOSIS — J9811 Atelectasis: Secondary | ICD-10-CM | POA: Diagnosis present

## 2019-10-23 DIAGNOSIS — I10 Essential (primary) hypertension: Secondary | ICD-10-CM | POA: Diagnosis present

## 2019-10-23 DIAGNOSIS — D539 Nutritional anemia, unspecified: Secondary | ICD-10-CM | POA: Diagnosis present

## 2019-10-23 DIAGNOSIS — F10229 Alcohol dependence with intoxication, unspecified: Secondary | ICD-10-CM | POA: Diagnosis present

## 2019-10-23 LAB — URINALYSIS, ROUTINE W REFLEX MICROSCOPIC
Bilirubin Urine: NEGATIVE
Glucose, UA: NEGATIVE mg/dL
Hgb urine dipstick: NEGATIVE
Ketones, ur: NEGATIVE mg/dL
Leukocytes,Ua: NEGATIVE
Nitrite: NEGATIVE
Protein, ur: NEGATIVE mg/dL
Specific Gravity, Urine: 1.01 (ref 1.005–1.030)
pH: 7 (ref 5.0–8.0)

## 2019-10-23 LAB — CBC
HCT: 35.2 % — ABNORMAL LOW (ref 39.0–52.0)
Hemoglobin: 12 g/dL — ABNORMAL LOW (ref 13.0–17.0)
MCH: 35.8 pg — ABNORMAL HIGH (ref 26.0–34.0)
MCHC: 34.1 g/dL (ref 30.0–36.0)
MCV: 105.1 fL — ABNORMAL HIGH (ref 80.0–100.0)
Platelets: 199 10*3/uL (ref 150–400)
RBC: 3.35 MIL/uL — ABNORMAL LOW (ref 4.22–5.81)
RDW: 18.6 % — ABNORMAL HIGH (ref 11.5–15.5)
WBC: 9.2 10*3/uL (ref 4.0–10.5)
nRBC: 0 % (ref 0.0–0.2)

## 2019-10-23 LAB — HEPATITIS PANEL, ACUTE
HCV Ab: NONREACTIVE
Hep A IgM: NONREACTIVE
Hep B C IgM: NONREACTIVE
Hepatitis B Surface Ag: NONREACTIVE

## 2019-10-23 LAB — COMPREHENSIVE METABOLIC PANEL
ALT: 39 U/L (ref 0–44)
AST: 156 U/L — ABNORMAL HIGH (ref 15–41)
Albumin: 3 g/dL — ABNORMAL LOW (ref 3.5–5.0)
Alkaline Phosphatase: 158 U/L — ABNORMAL HIGH (ref 38–126)
Anion gap: 14 (ref 5–15)
BUN: 13 mg/dL (ref 8–23)
CO2: 26 mmol/L (ref 22–32)
Calcium: 8.8 mg/dL — ABNORMAL LOW (ref 8.9–10.3)
Chloride: 93 mmol/L — ABNORMAL LOW (ref 98–111)
Creatinine, Ser: 1.45 mg/dL — ABNORMAL HIGH (ref 0.61–1.24)
GFR calc Af Amer: 55 mL/min — ABNORMAL LOW (ref >60)
GFR calc non Af Amer: 47 mL/min — ABNORMAL LOW (ref >60)
Glucose, Bld: 145 mg/dL — ABNORMAL HIGH (ref 70–99)
Potassium: 3.9 mmol/L (ref 3.5–5.1)
Sodium: 133 mmol/L — ABNORMAL LOW (ref 135–145)
Total Bilirubin: 8.2 mg/dL — ABNORMAL HIGH (ref 0.3–1.2)
Total Protein: 7.5 g/dL (ref 6.5–8.1)

## 2019-10-23 LAB — PROTIME-INR
INR: 1.3 — ABNORMAL HIGH (ref 0.8–1.2)
Prothrombin Time: 16 seconds — ABNORMAL HIGH (ref 11.4–15.2)

## 2019-10-23 LAB — CBG MONITORING, ED: Glucose-Capillary: 127 mg/dL — ABNORMAL HIGH (ref 70–99)

## 2019-10-23 LAB — ACETAMINOPHEN LEVEL: Acetaminophen (Tylenol), Serum: <10 ug/mL — ABNORMAL LOW (ref 10–30)

## 2019-10-23 LAB — AMMONIA: Ammonia: 38 umol/L — ABNORMAL HIGH (ref 9–35)

## 2019-10-23 LAB — ETHANOL: Alcohol, Ethyl (B): 289 mg/dL — ABNORMAL HIGH (ref <10)

## 2019-10-23 MED ORDER — CHLORDIAZEPOXIDE HCL 25 MG PO CAPS
25.0000 mg | ORAL_CAPSULE | Freq: Once | ORAL | Status: AC
  Administered 2019-10-24: 22:00:00 25 mg via ORAL
  Filled 2019-10-23: qty 1

## 2019-10-23 MED ORDER — SODIUM CHLORIDE 0.9 % IV SOLN
INTRAVENOUS | Status: AC
  Administered 2019-10-24: 01:00:00 50 mL/h via INTRAVENOUS

## 2019-10-23 MED ORDER — SODIUM CHLORIDE 0.9% FLUSH: 3.0000 mL | Freq: Once | INTRAVENOUS | Status: DC

## 2019-10-23 MED ORDER — CHLORDIAZEPOXIDE HCL 25 MG PO CAPS
50.0000 mg | ORAL_CAPSULE | Freq: Once | ORAL | Status: AC
  Administered 2019-10-23: 50 mg via ORAL
  Filled 2019-10-23: qty 2

## 2019-10-23 MED ORDER — LORAZEPAM 2 MG/ML IJ SOLN: 0.0000 mg | Freq: Four times a day (QID) | INTRAMUSCULAR | Status: AC

## 2019-10-23 MED ORDER — SODIUM CHLORIDE 0.9 % IV BOLUS
1000.0000 mL | Freq: Once | INTRAVENOUS | Status: AC
  Administered 2019-10-23: 20:00:00 1000 mL via INTRAVENOUS

## 2019-10-23 MED ORDER — LORAZEPAM 2 MG/ML IJ SOLN
0.0000 mg | Freq: Two times a day (BID) | INTRAMUSCULAR | Status: AC
  Administered 2019-10-26 – 2019-10-27 (×3): 1 mg via INTRAVENOUS
  Filled 2019-10-23 (×3): qty 1

## 2019-10-23 MED ORDER — THIAMINE HCL 100 MG/ML IJ SOLN: 100.0000 mg | Freq: Every day | INTRAMUSCULAR | Status: DC

## 2019-10-23 MED ORDER — THIAMINE HCL 100 MG PO TABS
100.0000 mg | ORAL_TABLET | Freq: Every day | ORAL | Status: DC
  Administered 2019-10-24 – 2019-10-29 (×6): 100 mg via ORAL
  Filled 2019-10-23 (×6): qty 1

## 2019-10-23 MED ORDER — IOHEXOL 350 MG/ML SOLN
100.0000 mL | Freq: Once | INTRAVENOUS | Status: AC | PRN
  Administered 2019-10-23: 100 mL via INTRAVENOUS

## 2019-10-23 MED ORDER — LORAZEPAM 1 MG PO TABS: 1.0000 mg | ORAL_TABLET | ORAL | Status: AC | PRN

## 2019-10-23 MED ORDER — PRO-STAT SUGAR FREE PO LIQD
30.0000 mL | Freq: Two times a day (BID) | ORAL | Status: DC
  Administered 2019-10-23 – 2019-10-29 (×11): 30 mL via ORAL
  Filled 2019-10-23 (×10): qty 30

## 2019-10-23 MED ORDER — FOLIC ACID 1 MG PO TABS
1.0000 mg | ORAL_TABLET | Freq: Every day | ORAL | Status: DC
  Administered 2019-10-24 – 2019-10-29 (×6): 1 mg via ORAL
  Filled 2019-10-23 (×6): qty 1

## 2019-10-23 MED ORDER — ADULT MULTIVITAMIN W/MINERALS CH
1.0000 | ORAL_TABLET | Freq: Every day | ORAL | Status: DC
  Administered 2019-10-24 – 2019-10-29 (×6): 1 via ORAL
  Filled 2019-10-23 (×6): qty 1

## 2019-10-23 MED ORDER — LORAZEPAM 2 MG/ML IJ SOLN
1.0000 mg | INTRAMUSCULAR | Status: AC | PRN
  Administered 2019-10-26: 1 mg via INTRAVENOUS
  Administered 2019-10-26 (×2): 2 mg via INTRAVENOUS
  Administered 2019-10-26: 1 mg via INTRAVENOUS
  Filled 2019-10-23: qty 1
  Filled 2019-10-23: qty 2
  Filled 2019-10-23 (×3): qty 1

## 2019-10-23 NOTE — ED Provider Notes (Signed)
[facility] DEPT Provider Note   CSN: [id number] Arrival date & time: 10/23/19  1747     History Chief Complaint  Patient presents with  . Jaundice  . Loss of Consciousness    Richard Irwin is a 74 y.o. male.  He is here for evaluation of syncopal event earlier today.  He said he was just standing in the kitchen and fell to the floor.  He said he was unconscious for only a few seconds.  Denies any injury.  He said this is a second event in the last few days and had 1 prior to that.  He said he drinks every day 3 or 4 drinks a day but denies any withdrawal symptoms.  He has no current complaints.  He is noted to be jaundiced when I asked him about this he did not realize this.  He has noticed a 30 pound weight loss over the past few months and that his stools are more narrow.  No color change in the stools.  No vomiting blood or passing blood from rectum.  No abdominal pain.  He has appointment with Dr. Layne Benton next month.  The history is provided by the patient.  Loss of Consciousness Episode history:  Multiple Most recent episode:  Today Progression:  Unchanged Chronicity:  New Context: normal activity   Witnessed: yes   Relieved by:  Lying down Worsened by:  Nothing Ineffective treatments:  None tried Associated symptoms: no chest pain, no confusion, no difficulty breathing, no fever, no focal sensory loss, no focal weakness, no headaches, no nausea, no recent surgery, no rectal bleeding, no shortness of breath, no visual change and no vomiting        Past Medical History:  Diagnosis Date  . Acute blood loss anemia   . Anemia   . Diverticulosis   . Emphysema/COPD ([place])   . H/O: GI bleed   . Hypertension   . Insomnia   . Lung cancer ([place])   . Status post colonoscopy with polypectomy    with bleed  . Thoracic ascending aortic aneurysm ([place]) 05/07/2013  . Tubular adenoma     Patient Active Problem List   Diagnosis Date Noted  .  GIB (gastrointestinal bleeding) 07/09/2014  . Postoperative anemia due to acute blood loss 07/09/2014  . Hypokalemia 07/09/2014  . Loss of weight 08/04/2013  . Dyspnea on exertion 08/04/2013  . Thoracic ascending aortic aneurysm ([place]) 05/07/2013  . Aortic root dilation ([place]) 05/06/2013  . Essential hypertension 09/12/2007    Past Surgical History:  Procedure Laterality Date  . APPENDECTOMY    . COLONOSCOPY WITH PROPOFOL N/A 07/09/2014   Procedure: COLONOSCOPY WITH PROPOFOL;  Surgeon: Irene Shipper, MD;  Location: [facility];  Service: Endoscopy;  Laterality: N/A;  . EXCISIONAL HEMORRHOIDECTOMY    . HERNIA REPAIR    . ROTATOR CUFF REPAIR         Family History  Problem Relation Age of Onset  . Cirrhosis Father   . Lung cancer Mother   . Colon cancer Neg Hx   . Pancreatic cancer Neg Hx   . Rectal cancer Neg Hx   . Stomach cancer Neg Hx     Social History   Tobacco Use  . Smoking status: Never Smoker  . Smokeless tobacco: Never Used  Substance Use Topics  . Alcohol use: Yes    Comment: beer 2-4 daily  . Drug use: No    Home Medications Prior to Admission medications  Medication Sig Start Date End Date Taking? Authorizing Provider  ferrous sulfate 325 (65 FE) MG EC tablet Take 1 tablet (325 mg total) by mouth daily with breakfast. 07/10/14   Janece Canterbury, MD  hydrochlorothiazide (HYDRODIURIL) 25 MG tablet Take 12.5 mg by mouth daily.    [provider]  losartan (COZAAR) 100 MG tablet Take 50 mg by mouth daily.    [provider]  ranitidine (ZANTAC) 150 MG tablet Take 150 mg by mouth 2 (two) times daily as needed for heartburn.     [provider]  sildenafil (VIAGRA) 100 MG tablet Take 100 mg by mouth daily as needed for erectile dysfunction.    [provider]  traZODone (DESYREL) 50 MG tablet Take 50 mg by mouth at bedtime.    [provider]    Allergies    Lisinopril  Review of Systems   Review of Systems   Constitutional: Negative for fever.  HENT: Negative for sore throat.   Eyes: Negative for visual disturbance.  Respiratory: Negative for shortness of breath.   Cardiovascular: Positive for syncope. Negative for chest pain.  Gastrointestinal: Negative for abdominal pain, anal bleeding, nausea and vomiting.  Genitourinary: Negative for dysuria.  Musculoskeletal: Negative for back pain.  Skin: Negative for rash.  Neurological: Positive for syncope. Negative for focal weakness and headaches.  Psychiatric/Behavioral: Negative for confusion.    Physical Exam Updated Vital Signs BP 92/63 (BP Location: Right Arm)   Pulse (!) 101   Temp 98 F (36.7 C) (Oral)   Resp 20   Ht 5\' 11"  (1.803 m)   Wt 74.8 kg   SpO2 97%   BMI 23.01 kg/m   Physical Exam Vitals and nursing note reviewed.  Constitutional:      Appearance: He is well-developed.  HENT:     Head: Normocephalic and atraumatic.  Eyes:     General: Scleral icterus present.     Conjunctiva/sclera: Conjunctivae normal.  Cardiovascular:     Rate and Rhythm: Normal rate and regular rhythm.     Pulses: Normal pulses.     Heart sounds: No murmur.  Pulmonary:     Effort: Pulmonary effort is normal. No respiratory distress.     Breath sounds: Normal breath sounds.  Abdominal:     Palpations: Abdomen is soft.     Tenderness: There is no abdominal tenderness. There is no guarding or rebound.  Musculoskeletal:        General: No deformity or signs of injury. Normal range of motion.     Cervical back: Neck supple.     Right lower leg: No edema.     Left lower leg: No edema.  Skin:    General: Skin is warm and dry.     Capillary Refill: Capillary refill takes less than 2 seconds.     Coloration: Skin is jaundiced.  Neurological:     General: No focal deficit present.     Mental Status: He is alert.     ED Results / Procedures / Treatments   Labs (all labs ordered are listed, but only abnormal results are displayed) Labs  Reviewed  CBC - Abnormal; Notable for the following components:      Result Value   RBC 3.35 (*)    Hemoglobin 12.0 (*)    HCT 35.2 (*)    MCV 105.1 (*)    MCH 35.8 (*)    RDW 18.6 (*)    All other components within normal limits  COMPREHENSIVE METABOLIC  PANEL - Abnormal; Notable for the following components:   Sodium 133 (*)    Chloride 93 (*)    Glucose, Bld 145 (*)    Creatinine, Ser 1.45 (*)    Calcium 8.8 (*)    Albumin 3.0 (*)    AST 156 (*)    Alkaline Phosphatase 158 (*)    Total Bilirubin 8.2 (*)    GFR calc non Af Amer 47 (*)    GFR calc Af Amer 55 (*)    All other components within normal limits  PROTIME-INR - Abnormal; Notable for the following components:   Prothrombin Time 16.0 (*)    INR 1.3 (*)    All other components within normal limits  ETHANOL - Abnormal; Notable for the following components:   Alcohol, Ethyl (B) 289 (*)    All other components within normal limits  ACETAMINOPHEN LEVEL - Abnormal; Notable for the following components:   Acetaminophen (Tylenol), Serum <10 (*)    All other components within normal limits  AMMONIA - Abnormal; Notable for the following components:   Ammonia 38 (*)    All other components within normal limits  COMPREHENSIVE METABOLIC PANEL - Abnormal; Notable for the following components:   Glucose, Bld 132 (*)    Creatinine, Ser 1.33 (*)    Calcium 8.2 (*)    Total Protein 6.1 (*)    Albumin 2.5 (*)    AST 123 (*)    Total Bilirubin 6.4 (*)    GFR calc non Af Amer 53 (*)    All other components within normal limits  CBC - Abnormal; Notable for the following components:   RBC 2.81 (*)    Hemoglobin 10.3 (*)    HCT 29.5 (*)    MCV 105.0 (*)    MCH 36.7 (*)    RDW 18.8 (*)    Platelets 149 (*)    All other components within normal limits  BILIRUBIN, DIRECT - Abnormal; Notable for the following components:   Bilirubin, Direct 3.8 (*)    All other components within normal limits  FERRITIN - Abnormal; Notable for  the following components:   Ferritin 2,466 (*)    All other components within normal limits  IRON AND TIBC - Abnormal; Notable for the following components:   TIBC 116 (*)    Saturation Ratios 93 (*)    All other components within normal limits  RETICULOCYTES - Abnormal; Notable for the following components:   RBC. 2.96 (*)    All other components within normal limits  CBG MONITORING, ED - Abnormal; Notable for the following components:   Glucose-Capillary 127 (*)    All other components within normal limits  SARS CORONAVIRUS 2 (TAT 6-24 HRS)  URINALYSIS, ROUTINE W REFLEX MICROSCOPIC  HEPATITIS PANEL, ACUTE  TSH  FOLATE  VITAMIN B12  SODIUM, URINE, RANDOM  HEPATITIS A ANTIBODY, TOTAL  HEPATITIS B CORE ANTIBODY, TOTAL  HEPATITIS B SURFACE ANTIBODY, QUANTITATIVE  TROPONIN I (HIGH SENSITIVITY)  TROPONIN I (HIGH SENSITIVITY)    EKG EKG Interpretation  Date/Time:  Wednesday October 23 2019 18:06:38 EST Ventricular Rate:  98 PR Interval:    QRS Duration: 118 QT Interval:  390 QTC Calculation: 498 R Axis:   -42 Text Interpretation: Sinus rhythm Ventricular premature complex Nonspecific IVCD with LAD Confirmed by Veryl Speak [phone]) on 10/23/2019 6:57:22 PM   Radiology CT Head Wo Contrast  Result Date: 10/23/2019 CLINICAL DATA:  Head trauma after syncopal episode EXAM: CT HEAD WITHOUT CONTRAST TECHNIQUE: Contiguous  axial images were obtained from the base of the skull through the vertex without intravenous contrast. COMPARISON:  August 29, 2008 FINDINGS: Brain: No evidence of acute territorial infarction, hemorrhage, hydrocephalus,extra-axial collection or mass lesion/mass effect. There is dilatation the ventricles and sulci consistent with age-related atrophy. Low-attenuation changes in the deep white matter consistent with small vessel ischemia. Vascular: No hyperdense vessel or unexpected calcification. Skull: The skull is intact. No fracture or focal lesion identified.  Sinuses/Orbits: The visualized paranasal sinuses and mastoid air cells are clear. The orbits and globes intact. Other: None IMPRESSION: No acute intracranial abnormality. Findings consistent with age related atrophy and chronic small vessel ischemia Electronically Signed   By: Prudencio Pair M.D.   On: 10/23/2019 21:08   CT Angio Chest PE W/Cm &/Or Wo Cm  Result Date: 10/23/2019 CLINICAL DATA:  Follow-up thoracic aneurysm, history of syncopal episode today EXAM: CT ANGIOGRAPHY CHEST WITH CONTRAST TECHNIQUE: Multidetector CT imaging of the chest was performed using the standard protocol during bolus administration of intravenous contrast. Multiplanar CT image reconstructions and MIPs were obtained to evaluate the vascular anatomy. CONTRAST:  186mL OMNIPAQUE IOHEXOL 350 MG/ML SOLN COMPARISON:  05/03/2013 FINDINGS: Cardiovascular: Thoracic aorta again demonstrates atherosclerotic calcifications. Considerable motion artifact somewhat limits evaluation of the ascending aorta. Dilatation is noted similar to that seen on the prior exam primarily at the level of the aortic root and sino-tubular junction. The ascending aorta more distally measures 4 cm in greatest dimension. No evidence of dissection is seen. Normal tapering in the aortic arch and descending aorta are seen. No cardiac enlargement is seen. The pulmonary artery shows a normal branching pattern although no definitive filling defect to suggest pulmonary embolism is seen. Coronary calcifications are noted. No pericardial effusion is seen. Mediastinum/Nodes: Thoracic inlet is within normal limits. No sizable hilar or mediastinal adenopathy is noted. The esophagus as visualized is within normal limits. Lungs/Pleura: The lungs are well aerated bilaterally. Mild left basilar atelectasis is seen. No focal confluent infiltrate is noted. No sizable effusion or parenchymal nodules are seen. Upper Abdomen: Visualized upper abdomen shows fatty infiltration of the liver.  No other focal abnormality is noted. Elevation of the left hemidiaphragm is seen. Musculoskeletal: Degenerative changes of the thoracic spine are noted. No acute bony abnormality is seen. Review of the MIP images confirms the above findings. IMPRESSION: No evidence of pulmonary emboli. Overall stable dilatation of the ascending aorta when compared with the prior exam although some cardiac motion artifact is noted precluding adequate measurements of the aorta. No dissection is seen. Recommend annual imaging followup by CTA or MRA. This recommendation follows 2010 ACCF/AHA/AATS/ACR/ASA/SCA/SCAI/SIR/STS/SVM Guidelines for the Diagnosis and Management of Patients with Thoracic Aortic Disease. Circulation. 2010; 121: J570-V779. Aortic aneurysm NOS (ICD10-I71.9) Mild left basilar atelectasis. Fatty infiltration of the liver. Aortic Atherosclerosis (ICD10-I70.0). Aortic aneurysm NOS (ICD10-I71.9). Electronically Signed   By: Inez Catalina M.D.   On: 10/23/2019 21:14   CT Abdomen Pelvis W Contrast  Result Date: 10/23/2019 CLINICAL DATA:  Jaundice and syncopal episode EXAM: CT ABDOMEN AND PELVIS WITH CONTRAST TECHNIQUE: Multidetector CT imaging of the abdomen and pelvis was performed using the standard protocol following bolus administration of intravenous contrast. CONTRAST:  123mL OMNIPAQUE IOHEXOL 350 MG/ML SOLN COMPARISON:  None. FINDINGS: Lower chest: There is mild cardiomegaly. Dense coronary artery calcifications are seen. There is elevation of the left hemidiaphragm. The visualized portions of the lungs are clear. Hepatobiliary: There is diffuse low density seen throughout the liver parenchyma. No focal hepatic lesion is  seen.The main portal vein is patent. There is hyperenhancement of the gallbladder wall with a small amount of mesenteric fat stranding changes. No calcified gallstones however are noted. Pancreas: Unremarkable. No pancreatic ductal dilatation or surrounding inflammatory changes. Spleen: Normal in  size without focal abnormality. Adrenals/Urinary Tract: Both adrenal glands appear normal. The kidneys and collecting system appear normal without evidence of urinary tract calculus or hydronephrosis. Bladder is unremarkable. Stomach/Bowel: The stomach and small bowel are normal in size and contour. There small foci of air seen adjacent to the anterior liver which are thought to be in decompressed hepatic flexure. The remainder of the colon is unremarkable. Vascular/Lymphatic: There are no enlarged mesenteric, retroperitoneal, or pelvic lymph nodes. Scattered aortic atherosclerotic calcifications are seen without aneurysmal dilatation. Reproductive: The prostate is unremarkable. Other: No evidence of abdominal wall mass or hernia. Musculoskeletal: No acute or significant osseous findings. Degenerative changes are seen throughout the thoracolumbar spine. Endplate sclerosis and cystic changes most notable at L3-L4. IMPRESSION: 1. Hyperenhancement of the gallbladder wall with mild surrounding inflammatory changes, however no definite calcified gallstones are seen. This could be due to early acute acalculous cholecystitis. If further evaluation is required would recommend right upper quadrant ultrasound. 2. Diverticulosis without diverticulitis. 3.  Aortic Atherosclerosis (ICD10-I70.0). 4. Hepatic steatosis 5. Dense coronary artery calcifications. Electronically Signed   By: Prudencio Pair M.D.   On: 10/23/2019 21:15   VAS US CAROTID  Result Date: 10/24/2019 Carotid Arterial Duplex Study Indications:       Syncope. Risk Factors:      Hypertension. Comparison Study:  No prior study Performing Technologist: Maudry Mayhew MHA, RDMS, RVT, RDCS  Examination Guidelines: A complete evaluation includes B-mode imaging, spectral Doppler, color Doppler, and power Doppler as needed of all accessible portions of each vessel. Bilateral testing is considered an integral part of a complete examination. Limited examinations for  reoccurring indications may be performed as noted.  Right Carotid Findings: +----------+--------+--------+--------+-----------------------+--------+           PSV cm/sEDV cm/sStenosisPlaque Description     Comments +----------+--------+--------+--------+-----------------------+--------+ CCA Prox  78      18              smooth and heterogenous         +----------+--------+--------+--------+-----------------------+--------+ CCA Distal66      20                                              +----------+--------+--------+--------+-----------------------+--------+ ICA Prox  93      24              calcific                        +----------+--------+--------+--------+-----------------------+--------+ ICA Distal140     36                                              +----------+--------+--------+--------+-----------------------+--------+ ECA       120     16              calcific                        +----------+--------+--------+--------+-----------------------+--------+ +----------+--------+-------+----------------+-------------------+  PSV cm/sEDV cmsDescribe        Arm Pressure (mmHG) +----------+--------+-------+----------------+-------------------+ [id number]             Multiphasic, WNL                    +----------+--------+-------+----------------+-------------------+ +---------+--------+--+--------+-+---------+ VertebralPSV cm/s17EDV cm/s6Antegrade +---------+--------+--+--------+-+---------+  Left Carotid Findings: +----------+-------+-------+--------+---------------------------------+--------+           PSV    EDV    StenosisPlaque Description               Comments           cm/s   cm/s                                                     +----------+-------+-------+--------+---------------------------------+--------+ CCA Prox  101    23                                                        +----------+-------+-------+--------+---------------------------------+--------+ CCA Distal122    30             irregular and heterogenous                +----------+-------+-------+--------+---------------------------------+--------+ ICA Prox  52     16             irregular, heterogenous and                                               calcific                                  +----------+-------+-------+--------+---------------------------------+--------+ ICA Distal70     23             calcific                                  +----------+-------+-------+--------+---------------------------------+--------+ ECA       92     19                                                       +----------+-------+-------+--------+---------------------------------+--------+ +----------+--------+--------+----------------+-------------------+           PSV cm/sEDV cm/sDescribe        Arm Pressure (mmHG) +----------+--------+--------+----------------+-------------------+ [id number]             Multiphasic, WNL                    +----------+--------+--------+----------------+-------------------+ +---------+--------+--+--------+--+---------+ VertebralPSV cm/s40EDV cm/s13Antegrade +---------+--------+--+--------+--+---------+   Summary: Right Carotid: Velocities in the right ICA are consistent with a 1-39% stenosis. Left Carotid: Velocities in the left ICA are consistent with a 1-39% stenosis. Vertebrals:  Bilateral vertebral arteries demonstrate antegrade flow. Subclavians: Normal flow hemodynamics were seen in bilateral subclavian  arteries. *See table(s) above for measurements and observations.     Preliminary     Procedures Procedures (including critical care time)  Medications Ordered in ED Medications  sodium chloride flush (NS) 0.9 % injection 3 mL (3 mLs Intravenous Not Given 10/24/19 0032)  0.9 %  sodium chloride infusion (50 mL/hr Intravenous New  Bag/Given 10/24/19 0125)  feeding supplement (PRO-STAT SUGAR FREE 64) liquid 30 mL (30 mLs Oral Given 10/23/19 2335)  chlordiazePOXIDE (LIBRIUM) capsule 25 mg (has no administration in time range)  LORazepam (ATIVAN) tablet 1-4 mg (has no administration in time range)    Or  LORazepam (ATIVAN) injection 1-4 mg (has no administration in time range)  thiamine tablet 100 mg (has no administration in time range)    Or  thiamine (B-1) injection 100 mg (has no administration in time range)  folic acid (FOLVITE) tablet 1 mg (has no administration in time range)  multivitamin with minerals tablet 1 tablet (has no administration in time range)  LORazepam (ATIVAN) injection 0-4 mg (0 mg Intravenous Not Given 10/24/19 0739)    Followed by  LORazepam (ATIVAN) injection 0-4 mg (has no administration in time range)  losartan (COZAAR) tablet 100 mg (has no administration in time range)  sodium chloride 0.9 % bolus 1,000 mL (0 mLs Intravenous Stopped 10/23/19 2151)  iohexol (OMNIPAQUE) 350 MG/ML injection 100 mL (100 mLs Intravenous Contrast Given 10/23/19 2043)  chlordiazePOXIDE (LIBRIUM) capsule 50 mg (50 mg Oral Given 10/23/19 2330)    ED Course  I have reviewed the triage vital signs and the nursing notes.  Pertinent labs & imaging results that were available during my care of the patient were reviewed by me and considered in my medical decision making (see chart for details).  Clinical Course as of Oct 23 1013  Wed Oct 23, 2019  2151 Differential includes head bleed, stroke, aortic dissection, liver disease, coagulopathy, malignancy.   [MB]  2152 Patient's labs showing a mild anemia but similar to his prior.  LFTs are newly elevated with a bili of 8.2.  Creatinine slightly up at 1.45.   [MB]  2152 Alcohol elevated at 289.  Minimal coagulopathy of INR 1.3.  CT showing some gallbladder enlargement but no clear stones.  I consulted Dr. Lupita Leash GI and he feels this is all probably related to an alcoholic  hepatitis.  He recommends that the patient be admitted and the hospitalist consider putting the patient on a CIWA.  He will see him tomorrow.  He does not feel he needs any acute right upper quadrant ultrasound imaging.   [MB]    Clinical Course User Index [MB] Hayden Rasmussen, MD   MDM Rules/Calculators/A&P                      Final Clinical Impression(s) / ED Diagnoses Final diagnoses:  Syncope and collapse  Jaundice  Unintentional weight loss  Alcohol dependence with unspecified alcohol-induced disorder [facility])    Rx / DC Orders ED Discharge Orders    None       Hayden Rasmussen, MD 10/24/19 1016

## 2019-10-23 NOTE — ED Triage Notes (Signed)
Patient reports to the ED following a syncopal episode today. Patient states this is his 3rd episode in the last few days. Patient is also jaundice and reports he has been drinking heavily and drinks multiple drinks a night.  Patient denies injury from fall, denies bleeding, and denies hitting his head. Patient reports he collapsed in the kitchen.

## 2019-10-23 NOTE — H&P (Signed)
TRH H&P    Patient Demographics:    Richard Irwin, is a 74 y.o. male  MRN: [id number]  DOB - 1946-01-24  Admit Date - 10/23/2019  Referring MD/NP/PA:  Aletta Edouard  Outpatient Primary MD for the patient is Lawerance Cruel, MD  Patient coming from:  home  Chief complaint- Syncope, and jaundice   HPI:    Richard Irwin  is a 74 y.o. male, w hypertension, Etoh abuse,  anemia, diverticulosis, h/o GI bleeding,  Copd, was at the bar standing at home when had syncope, per wife his eyes rolled back. No tongue laceration, no incontinence. No tonic clonic movement.  Pt states lasted a few sec.  Ended up on floor ,  Wife called EMS.    In ED,  T 98 P 101 R 20, bp 92/63  Pox 97% on RA Wt 74.8kg  CT brain  IMPRESSION: No acute intracranial abnormality.  Findings consistent with age related atrophy and chronic small vessel ischemia  CTA chest IMPRESSION: No evidence of pulmonary emboli.  Overall stable dilatation of the ascending aorta when compared with the prior exam although some cardiac motion artifact is noted precluding adequate measurements of the aorta. No dissection is seen. Recommend annual imaging followup by CTA or MRA. This recommendation follows 2010  CT abd/ pelvis IMPRESSION: 1. Hyperenhancement of the gallbladder wall with mild surrounding inflammatory changes, however no definite calcified gallstones are seen. This could be due to early acute acalculous cholecystitis. If further evaluation is required would recommend right upper quadrant ultrasound. 2. Diverticulosis without diverticulitis. 3.  Aortic Atherosclerosis (ICD10-I70.0). 4. Hepatic steatosis 5. Dense coronary artery calcifications.  Wbc 9.2, Hgb 12.0, Plt 199 Na 133, K 3.9 Bun 13, Creatinine 1.45 Ast 156, Alt 39 Alk phos 158 T. Bili 8.2 Acute hepatitis panel negative Ammonia 38 Tylenol <10 Urinalysis  negative  INR 1.3  Pt will be admitted for syncope and abnormal liver function/ jaundice.          Review of systems:    In addition to the HPI above,  No Fever-chills, No Headache, No changes with Vision or hearing, No problems swallowing food or Liquids, No Chest pain, Cough or Shortness of Breath, No Abdominal pain, No Nausea or Vomiting, bowel movements are regular, No Blood in stool or Urine, No dysuria, No new skin rashes or bruises, No new joints pains-aches,  No new weakness, tingling, numbness in any extremity, No recent weight gain or loss, No polyuria, polydypsia or polyphagia, No significant Mental Stressors.  All other systems reviewed and are negative.    Past History of the following :    Past Medical History:  Diagnosis Date  . Acute blood loss anemia   . Anemia   . Diverticulosis   . Emphysema/COPD ([place])   . H/O: GI bleed   . Hypertension   . Insomnia   . Lung cancer ([place])   . Status post colonoscopy with polypectomy    with bleed  . Thoracic ascending aortic aneurysm ([place]) 05/07/2013  . Tubular adenoma  Past Surgical History:  Procedure Laterality Date  . APPENDECTOMY    . COLONOSCOPY WITH PROPOFOL N/A 07/09/2014   Procedure: COLONOSCOPY WITH PROPOFOL;  Surgeon: Irene Shipper, MD;  Location: [facility];  Service: Endoscopy;  Laterality: N/A;  . EXCISIONAL HEMORRHOIDECTOMY    . HERNIA REPAIR    . ROTATOR CUFF REPAIR        Social History:      Social History   Tobacco Use  . Smoking status: Never Smoker  . Smokeless tobacco: Never Used  Substance Use Topics  . Alcohol use: Yes    Comment: beer 2-4 daily       Family History :     Family History  Problem Relation Age of Onset  . Cirrhosis Father   . Lung cancer Mother   . Colon cancer Neg Hx   . Pancreatic cancer Neg Hx   . Rectal cancer Neg Hx   . Stomach cancer Neg Hx        Home Medications:   Prior to Admission medications   Medication Sig Start  Date End Date Taking? Authorizing Provider  losartan (COZAAR) 100 MG tablet Take 100 mg by mouth daily.    Yes [provider]  ferrous sulfate 325 (65 FE) MG EC tablet Take 1 tablet (325 mg total) by mouth daily with breakfast. Patient not taking: Reported on 10/23/2019 07/10/14   Janece Canterbury, MD     Allergies:     Allergies  Allergen Reactions  . Lisinopril Cough     Physical Exam:   Vitals  Blood pressure 124/84, pulse 96, temperature 98 F (36.7 C), temperature source Oral, resp. rate 12, height 5' 11"  (1.803 m), weight 74.8 kg, SpO2 97 %.  1.  General: axoxo3  2. Psychiatric: euthymic  3. Neurologic: nonfocal  4. HEENMT:  Icteric, pupils 1.54m symmetric, direct, consensual intact Neck: no jvd  5. Respiratory : CTAB  6. Cardiovascular : rrr s1, s2,   7. Gastrointestinal:  Abd: soft, nt, nd, +bs  8. Skin:  Ext: no c/c/e, no rash, no palmar erythema, no asterixis  9.Musculoskeletal:  Good ROM    Data Review:    CBC Recent Labs  Lab 10/23/19 1814  WBC 9.2  HGB 12.0*  HCT 35.2*  PLT 199  MCV 105.1*  MCH 35.8*  MCHC 34.1  RDW 18.6*   ------------------------------------------------------------------------------------------------------------------  Results for orders placed or performed during the hospital encounter of 10/23/19 (from the past 48 hour(s))  CBC     Status: Abnormal   Collection Time: 10/23/19  6:14 PM  Result Value Ref Range   WBC 9.2 4.0 - 10.5 K/uL   RBC 3.35 (L) 4.22 - 5.81 MIL/uL   Hemoglobin 12.0 (L) 13.0 - 17.0 g/dL   HCT 35.2 (L) 39.0 - 52.0 %   MCV 105.1 (H) 80.0 - 100.0 fL   MCH 35.8 (H) 26.0 - 34.0 pg   MCHC 34.1 30.0 - 36.0 g/dL   RDW 18.6 (H) 11.5 - 15.5 %   Platelets 199 150 - 400 K/uL   nRBC 0.0 0.0 - 0.2 %    Comment: Performed at [facility] [id number], [place] [address] Urinalysis, Routine w reflex microscopic     Status: None   Collection Time: 10/23/19   6:14 PM  Result Value Ref Range   Color, Urine YELLOW YELLOW   APPearance CLEAR CLEAR   Specific Gravity, Urine 1.010 1.005 - 1.030   pH 7.0 5.0 -  8.0   Glucose, UA NEGATIVE NEGATIVE mg/dL   Hgb urine dipstick NEGATIVE NEGATIVE   Bilirubin Urine NEGATIVE NEGATIVE   Ketones, ur NEGATIVE NEGATIVE mg/dL   Protein, ur NEGATIVE NEGATIVE mg/dL   Nitrite NEGATIVE NEGATIVE   Leukocytes,Ua NEGATIVE NEGATIVE    Comment: Performed at [facility] [address], [place], [place] [address]  Comprehensive metabolic panel     Status: Abnormal   Collection Time: 10/23/19  6:14 PM  Result Value Ref Range   Sodium 133 (L) 135 - 145 mmol/L   Potassium 3.9 3.5 - 5.1 mmol/L   Chloride 93 (L) 98 - 111 mmol/L   CO2 26 22 - 32 mmol/L   Glucose, Bld 145 (H) 70 - 99 mg/dL   BUN 13 8 - 23 mg/dL   Creatinine, Ser 1.45 (H) 0.61 - 1.24 mg/dL   Calcium 8.8 (L) 8.9 - 10.3 mg/dL   Total Protein 7.5 6.5 - 8.1 g/dL   Albumin 3.0 (L) 3.5 - 5.0 g/dL   AST 156 (H) 15 - 41 U/L   ALT 39 0 - 44 U/L   Alkaline Phosphatase 158 (H) 38 - 126 U/L   Total Bilirubin 8.2 (H) 0.3 - 1.2 mg/dL   GFR calc non Af Amer 47 (L) >60 mL/min   GFR calc Af Amer 55 (L) >60 mL/min   Anion gap 14 5 - 15    Comment: Performed at [facility], [place] [address], [place], [place] [address]  Protime-INR     Status: Abnormal   Collection Time: 10/23/19  8:10 PM  Result Value Ref Range   Prothrombin Time 16.0 (H) 11.4 - 15.2 seconds   INR 1.3 (H) 0.8 - 1.2    Comment: (NOTE) INR goal varies based on device and disease states. Performed at [facility], [place] [address] [place], [place] [address]   Ethanol     Status: Abnormal   Collection Time: 10/23/19  8:10 PM  Result Value Ref Range   Alcohol, Ethyl (B) 289 (H) <10 mg/dL    Comment: (NOTE) Lowest detectable limit for serum alcohol is 10 mg/dL. For medical purposes only. Performed at [facility], [place]  [address], [place], [place] [address]   CBG monitoring, ED     Status: Abnormal   Collection Time: 10/23/19  8:13 PM  Result Value Ref Range   Glucose-Capillary 127 (H) 70 - 99 mg/dL    Chemistries  Recent Labs  Lab 10/23/19 1814  NA 133*  K 3.9  CL 93*  CO2 26  GLUCOSE 145*  BUN 13  CREATININE 1.45*  CALCIUM 8.8*  AST 156*  ALT 39  ALKPHOS 158*  BILITOT 8.2*   ------------------------------------------------------------------------------------------------------------------  ------------------------------------------------------------------------------------------------------------------ GFR: Estimated Creatinine Clearance: 48 mL/min (A) (by C-G formula based on SCr of 1.45 mg/dL (H)). Liver Function Tests: Recent Labs  Lab 10/23/19 1814  AST 156*  ALT 39  ALKPHOS 158*  BILITOT 8.2*  PROT 7.5  ALBUMIN 3.0*   No results for input(s): LIPASE, AMYLASE in the last 168 hours. No results for input(s): AMMONIA in the last 168 hours. Coagulation Profile: Recent Labs  Lab 10/23/19 2010  INR 1.3*   Cardiac Enzymes: No results for input(s): CKTOTAL, CKMB, CKMBINDEX, TROPONINI in the last 168 hours. BNP (last 3 results) No results for input(s): PROBNP in the last 8760 hours. HbA1C: No results for input(s): HGBA1C in the last 72 hours. CBG: Recent Labs  Lab 10/23/19 2013  GLUCAP  127*   Lipid Profile: No results for input(s): CHOL, HDL, LDLCALC, TRIG, CHOLHDL, LDLDIRECT in the last 72 hours. Thyroid Function Tests: No results for input(s): TSH, T4TOTAL, FREET4, T3FREE, THYROIDAB in the last 72 hours. Anemia Panel: No results for input(s): VITAMINB12, FOLATE, FERRITIN, TIBC, IRON, RETICCTPCT in the last 72 hours.  --------------------------------------------------------------------------------------------------------------- Urine analysis:    Component Value Date/Time   COLORURINE YELLOW 10/23/2019 1814   APPEARANCEUR CLEAR 10/23/2019 1814   LABSPEC 1.010  10/23/2019 1814   PHURINE 7.0 10/23/2019 1814   GLUCOSEU NEGATIVE 10/23/2019 1814   HGBUR NEGATIVE 10/23/2019 1814   BILIRUBINUR NEGATIVE 10/23/2019 1814   KETONESUR NEGATIVE 10/23/2019 1814   PROTEINUR NEGATIVE 10/23/2019 1814   UROBILINOGEN 0.2 08/29/2008 2215   NITRITE NEGATIVE 10/23/2019 1814   LEUKOCYTESUR NEGATIVE 10/23/2019 1814      Imaging Results:    CT Head Wo Contrast  Result Date: 10/23/2019 CLINICAL DATA:  Head trauma after syncopal episode EXAM: CT HEAD WITHOUT CONTRAST TECHNIQUE: Contiguous axial images were obtained from the base of the skull through the vertex without intravenous contrast. COMPARISON:  August 29, 2008 FINDINGS: Brain: No evidence of acute territorial infarction, hemorrhage, hydrocephalus,extra-axial collection or mass lesion/mass effect. There is dilatation the ventricles and sulci consistent with age-related atrophy. Low-attenuation changes in the deep white matter consistent with small vessel ischemia. Vascular: No hyperdense vessel or unexpected calcification. Skull: The skull is intact. No fracture or focal lesion identified. Sinuses/Orbits: The visualized paranasal sinuses and mastoid air cells are clear. The orbits and globes intact. Other: None IMPRESSION: No acute intracranial abnormality. Findings consistent with age related atrophy and chronic small vessel ischemia Electronically Signed   By: Prudencio Pair M.D.   On: 10/23/2019 21:08   CT Angio Chest PE W/Cm &/Or Wo Cm  Result Date: 10/23/2019 CLINICAL DATA:  Follow-up thoracic aneurysm, history of syncopal episode today EXAM: CT ANGIOGRAPHY CHEST WITH CONTRAST TECHNIQUE: Multidetector CT imaging of the chest was performed using the standard protocol during bolus administration of intravenous contrast. Multiplanar CT image reconstructions and MIPs were obtained to evaluate the vascular anatomy. CONTRAST:  162m OMNIPAQUE IOHEXOL 350 MG/ML SOLN COMPARISON:  05/03/2013 FINDINGS: Cardiovascular: Thoracic  aorta again demonstrates atherosclerotic calcifications. Considerable motion artifact somewhat limits evaluation of the ascending aorta. Dilatation is noted similar to that seen on the prior exam primarily at the level of the aortic root and sino-tubular junction. The ascending aorta more distally measures 4 cm in greatest dimension. No evidence of dissection is seen. Normal tapering in the aortic arch and descending aorta are seen. No cardiac enlargement is seen. The pulmonary artery shows a normal branching pattern although no definitive filling defect to suggest pulmonary embolism is seen. Coronary calcifications are noted. No pericardial effusion is seen. Mediastinum/Nodes: Thoracic inlet is within normal limits. No sizable hilar or mediastinal adenopathy is noted. The esophagus as visualized is within normal limits. Lungs/Pleura: The lungs are well aerated bilaterally. Mild left basilar atelectasis is seen. No focal confluent infiltrate is noted. No sizable effusion or parenchymal nodules are seen. Upper Abdomen: Visualized upper abdomen shows fatty infiltration of the liver. No other focal abnormality is noted. Elevation of the left hemidiaphragm is seen. Musculoskeletal: Degenerative changes of the thoracic spine are noted. No acute bony abnormality is seen. Review of the MIP images confirms the above findings. IMPRESSION: No evidence of pulmonary emboli. Overall stable dilatation of the ascending aorta when compared with the prior exam although some cardiac motion artifact is noted precluding adequate measurements of the  aorta. No dissection is seen. Recommend annual imaging followup by CTA or MRA. This recommendation follows 2010 ACCF/AHA/AATS/ACR/ASA/SCA/SCAI/SIR/STS/SVM Guidelines for the Diagnosis and Management of Patients with Thoracic Aortic Disease. Circulation. 2010; 121: E831-D176. Aortic aneurysm NOS (ICD10-I71.9) Mild left basilar atelectasis. Fatty infiltration of the liver. Aortic  Atherosclerosis (ICD10-I70.0). Aortic aneurysm NOS (ICD10-I71.9). Electronically Signed   By: Inez Catalina M.D.   On: 10/23/2019 21:14   CT Abdomen Pelvis W Contrast  Result Date: 10/23/2019 CLINICAL DATA:  Jaundice and syncopal episode EXAM: CT ABDOMEN AND PELVIS WITH CONTRAST TECHNIQUE: Multidetector CT imaging of the abdomen and pelvis was performed using the standard protocol following bolus administration of intravenous contrast. CONTRAST:  153m OMNIPAQUE IOHEXOL 350 MG/ML SOLN COMPARISON:  None. FINDINGS: Lower chest: There is mild cardiomegaly. Dense coronary artery calcifications are seen. There is elevation of the left hemidiaphragm. The visualized portions of the lungs are clear. Hepatobiliary: There is diffuse low density seen throughout the liver parenchyma. No focal hepatic lesion is seen.The main portal vein is patent. There is hyperenhancement of the gallbladder wall with a small amount of mesenteric fat stranding changes. No calcified gallstones however are noted. Pancreas: Unremarkable. No pancreatic ductal dilatation or surrounding inflammatory changes. Spleen: Normal in size without focal abnormality. Adrenals/Urinary Tract: Both adrenal glands appear normal. The kidneys and collecting system appear normal without evidence of urinary tract calculus or hydronephrosis. Bladder is unremarkable. Stomach/Bowel: The stomach and small bowel are normal in size and contour. There small foci of air seen adjacent to the anterior liver which are thought to be in decompressed hepatic flexure. The remainder of the colon is unremarkable. Vascular/Lymphatic: There are no enlarged mesenteric, retroperitoneal, or pelvic lymph nodes. Scattered aortic atherosclerotic calcifications are seen without aneurysmal dilatation. Reproductive: The prostate is unremarkable. Other: No evidence of abdominal wall mass or hernia. Musculoskeletal: No acute or significant osseous findings. Degenerative changes are seen  throughout the thoracolumbar spine. Endplate sclerosis and cystic changes most notable at L3-L4. IMPRESSION: 1. Hyperenhancement of the gallbladder wall with mild surrounding inflammatory changes, however no definite calcified gallstones are seen. This could be due to early acute acalculous cholecystitis. If further evaluation is required would recommend right upper quadrant ultrasound. 2. Diverticulosis without diverticulitis. 3.  Aortic Atherosclerosis (ICD10-I70.0). 4. Hepatic steatosis 5. Dense coronary artery calcifications. Electronically Signed   By: BPrudencio PairM.D.   On: 10/23/2019 21:15       Assessment & Plan:    Principal Problem:   Syncope Active Problems:   Essential hypertension   Abnormal liver function   Alcohol abuse   Hyponatremia   Protein-calorie malnutrition, severe (HCC)   Syncope   Check MRI  Check EEG  Check orthostatic bp Tele Trop i Carotid ultrasound Cardiac echo  Abnormal liver function Please stop ETOH use Check cmp in am GI consult (Velora Heckler) by ED, for AM consult, appreciate input  Hyperammonia Check ammonia in am Consider lactulose if having signs of encephalopathy  ETOH abuse Librium 534mpo qday x 1 days then 2578mo qday x 1 day CIWA  Hyponatremia Hydrate with ns iv Check cmp in am  Severe protein calorie malnutrition prostat 30 mL po bid  Hypertension  Cont Losartan 100m38m qday   DVT Prophylaxis-  - SCDs  AM Labs Ordered, also please review Full Orders  Family Communication: Admission, patients condition and plan of care including tests being ordered have been discussed with the patient  who indicate understanding and agree with the plan and Code Status.  Code Status:  FULL CODE per  patient,    Admission status: observationt: Based on patients clinical presentation and evaluation of above clinical data, I have made determination that patient meets observation criteria at this time.     Time spent in minutes : 55   minutes   Jani Gravel M.D on 10/23/2019 at 10:14 PM

## 2019-10-23 NOTE — Progress Notes (Signed)
ED TO INPATIENT HANDOFF REPORT  ED Nurse Name and Phone #: Joelly Bolanos [phone]  S Name/Age/Gender Emelia Loron 74 y.o. male Room/Bed: WA07/WA07  Code Status   Code Status: Full Code  Home/SNF/Other Home Patient oriented to: self, place, time and situation Is this baseline? Yes   Triage Complete: Triage complete  Chief Complaint Syncope [R55]  Triage Note Patient reports to the ED following a syncopal episode today. Patient states this is his 3rd episode in the last few days. Patient is also jaundice and reports he has been drinking heavily and drinks multiple drinks a night.  Patient denies injury from fall, denies bleeding, and denies hitting his head. Patient reports he collapsed in the kitchen.    Allergies Allergies  Allergen Reactions  . Lisinopril Cough    Level of Care/Admitting Diagnosis ED Disposition    ED Disposition Condition Comment   Admit  Hospital Area: [facility] [[id number]]  Level of Care: Telemetry [5]  Admit to tele based on following criteria: Monitor for Ischemic changes  Covid Evaluation: Asymptomatic Screening Protocol (No Symptoms)  Diagnosis: Syncope [154008]  Admitting Physician: Jani Gravel [[id number]]  Attending Physician: Jani Gravel [phone]       B Medical/Surgery History Past Medical History:  Diagnosis Date  . Acute blood loss anemia   . Anemia   . Diverticulosis   . Emphysema/COPD ([place])   . H/O: GI bleed   . Hypertension   . Insomnia   . Lung cancer ([place])   . Status post colonoscopy with polypectomy    with bleed  . Thoracic ascending aortic aneurysm ([place]) 05/07/2013  . Tubular adenoma    Past Surgical History:  Procedure Laterality Date  . APPENDECTOMY    . COLONOSCOPY WITH PROPOFOL N/A 07/09/2014   Procedure: COLONOSCOPY WITH PROPOFOL;  Surgeon: Irene Shipper, MD;  Location: [facility];  Service: Endoscopy;  Laterality: N/A;  . EXCISIONAL HEMORRHOIDECTOMY    . HERNIA REPAIR    . ROTATOR CUFF  REPAIR       A IV Location/Drains/Wounds Patient Lines/Drains/Airways Status   Active Line/Drains/Airways    Name:   Placement date:   Placement time:   Site:   Days:   Peripheral IV 10/23/19 Right Forearm   10/23/19    2009    Forearm   less than 1   Airway   07/09/14    1126     1932          Intake/Output Last 24 hours  Intake/Output Summary (Last 24 hours) at 10/23/2019 2349 Last data filed at 10/23/2019 2336 Gross per 24 hour  Intake 1000 ml  Output 550 ml  Net 450 ml    Labs/Imaging Results for orders placed or performed during the hospital encounter of 10/23/19 (from the past 48 hour(s))  CBC     Status: Abnormal   Collection Time: 10/23/19  6:14 PM  Result Value Ref Range   WBC 9.2 4.0 - 10.5 K/uL   RBC 3.35 (L) 4.22 - 5.81 MIL/uL   Hemoglobin 12.0 (L) 13.0 - 17.0 g/dL   HCT 35.2 (L) 39.0 - 52.0 %   MCV 105.1 (H) 80.0 - 100.0 fL   MCH 35.8 (H) 26.0 - 34.0 pg   MCHC 34.1 30.0 - 36.0 g/dL   RDW 18.6 (H) 11.5 - 15.5 %   Platelets 199 150 - 400 K/uL   nRBC 0.0 0.0 - 0.2 %    Comment: Performed at [facility], [place]  [address] [place], [place] [address]  Urinalysis, Routine w reflex microscopic     Status: None   Collection Time: 10/23/19  6:14 PM  Result Value Ref Range   Color, Urine YELLOW YELLOW   APPearance CLEAR CLEAR   Specific Gravity, Urine 1.010 1.005 - 1.030   pH 7.0 5.0 - 8.0   Glucose, UA NEGATIVE NEGATIVE mg/dL   Hgb urine dipstick NEGATIVE NEGATIVE   Bilirubin Urine NEGATIVE NEGATIVE   Ketones, ur NEGATIVE NEGATIVE mg/dL   Protein, ur NEGATIVE NEGATIVE mg/dL   Nitrite NEGATIVE NEGATIVE   Leukocytes,Ua NEGATIVE NEGATIVE    Comment: Performed at [facility] [address], [place], [place] [address]  Comprehensive metabolic panel     Status: Abnormal   Collection Time: 10/23/19  6:14 PM  Result Value Ref Range   Sodium 133 (L) 135 - 145 mmol/L   Potassium 3.9 3.5 - 5.1 mmol/L   Chloride 93 (L) 98 - 111  mmol/L   CO2 26 22 - 32 mmol/L   Glucose, Bld 145 (H) 70 - 99 mg/dL   BUN 13 8 - 23 mg/dL   Creatinine, Ser 1.45 (H) 0.61 - 1.24 mg/dL   Calcium 8.8 (L) 8.9 - 10.3 mg/dL   Total Protein 7.5 6.5 - 8.1 g/dL   Albumin 3.0 (L) 3.5 - 5.0 g/dL   AST 156 (H) 15 - 41 U/L   ALT 39 0 - 44 U/L   Alkaline Phosphatase 158 (H) 38 - 126 U/L   Total Bilirubin 8.2 (H) 0.3 - 1.2 mg/dL   GFR calc non Af Amer 47 (L) >60 mL/min   GFR calc Af Amer 55 (L) >60 mL/min   Anion gap 14 5 - 15    Comment: Performed at [facility], [place] [address], [place], [place] [address]  Protime-INR     Status: Abnormal   Collection Time: 10/23/19  8:10 PM  Result Value Ref Range   Prothrombin Time 16.0 (H) 11.4 - 15.2 seconds   INR 1.3 (H) 0.8 - 1.2    Comment: (NOTE) INR goal varies based on device and disease states. Performed at [facility], [place] [address] [place], [place] [address]   Hepatitis panel, acute     Status: None (Preliminary result)   Collection Time: 10/23/19  8:10 PM  Result Value Ref Range   Hepatitis B Surface Ag NON REACTIVE NON REACTIVE    Comment: Performed at [facility] [address], [place], [place] [address]   HCV Ab PENDING NON REACTIVE   Hep A IgM PENDING NON REACTIVE   Hep B C IgM PENDING NON REACTIVE  Ethanol     Status: Abnormal   Collection Time: 10/23/19  8:10 PM  Result Value Ref Range   Alcohol, Ethyl (B) 289 (H) <10 mg/dL    Comment: (NOTE) Lowest detectable limit for serum alcohol is 10 mg/dL. For medical purposes only. Performed at [facility], [place] [address], [place], [place] [address]   CBG monitoring, ED     Status: Abnormal   Collection Time: 10/23/19  8:13 PM  Result Value Ref Range   Glucose-Capillary 127 (H) 70 - 99 mg/dL  Acetaminophen level     Status: Abnormal   Collection Time: 10/23/19  9:57 PM  Result Value Ref Range   Acetaminophen (Tylenol), Serum <10 (L) 10 - 30 ug/mL    Comment:  (NOTE) Therapeutic concentrations vary significantly. A range of 10-30 ug/mL  may be an effective concentration for many patients. However, some  are best treated at concentrations outside of this range. Acetaminophen concentrations >150 ug/mL at 4 hours after ingestion  and >50 ug/mL at 12 hours after ingestion are often associated with  toxic reactions. Performed at [facility], [place] [address], [place], [place] [address]   Ammonia     Status: Abnormal   Collection Time: 10/23/19  9:57 PM  Result Value Ref Range   Ammonia 38 (H) 9 - 35 umol/L    Comment: Performed at [facility], [place] [address] [place], [place] [address]   CT Head Wo Contrast  Result Date: 10/23/2019 CLINICAL DATA:  Head trauma after syncopal episode EXAM: CT HEAD WITHOUT CONTRAST TECHNIQUE: Contiguous axial images were obtained from the base of the skull through the vertex without intravenous contrast. COMPARISON:  August 29, 2008 FINDINGS: Brain: No evidence of acute territorial infarction, hemorrhage, hydrocephalus,extra-axial collection or mass lesion/mass effect. There is dilatation the ventricles and sulci consistent with age-related atrophy. Low-attenuation changes in the deep white matter consistent with small vessel ischemia. Vascular: No hyperdense vessel or unexpected calcification. Skull: The skull is intact. No fracture or focal lesion identified. Sinuses/Orbits: The visualized paranasal sinuses and mastoid air cells are clear. The orbits and globes intact. Other: None IMPRESSION: No acute intracranial abnormality. Findings consistent with age related atrophy and chronic small vessel ischemia Electronically Signed   By: Prudencio Pair M.D.   On: 10/23/2019 21:08   CT Angio Chest PE W/Cm &/Or Wo Cm  Result Date: 10/23/2019 CLINICAL DATA:  Follow-up thoracic aneurysm, history of syncopal episode today EXAM: CT ANGIOGRAPHY CHEST WITH CONTRAST TECHNIQUE: Multidetector CT imaging  of the chest was performed using the standard protocol during bolus administration of intravenous contrast. Multiplanar CT image reconstructions and MIPs were obtained to evaluate the vascular anatomy. CONTRAST:  154mL OMNIPAQUE IOHEXOL 350 MG/ML SOLN COMPARISON:  05/03/2013 FINDINGS: Cardiovascular: Thoracic aorta again demonstrates atherosclerotic calcifications. Considerable motion artifact somewhat limits evaluation of the ascending aorta. Dilatation is noted similar to that seen on the prior exam primarily at the level of the aortic root and sino-tubular junction. The ascending aorta more distally measures 4 cm in greatest dimension. No evidence of dissection is seen. Normal tapering in the aortic arch and descending aorta are seen. No cardiac enlargement is seen. The pulmonary artery shows a normal branching pattern although no definitive filling defect to suggest pulmonary embolism is seen. Coronary calcifications are noted. No pericardial effusion is seen. Mediastinum/Nodes: Thoracic inlet is within normal limits. No sizable hilar or mediastinal adenopathy is noted. The esophagus as visualized is within normal limits. Lungs/Pleura: The lungs are well aerated bilaterally. Mild left basilar atelectasis is seen. No focal confluent infiltrate is noted. No sizable effusion or parenchymal nodules are seen. Upper Abdomen: Visualized upper abdomen shows fatty infiltration of the liver. No other focal abnormality is noted. Elevation of the left hemidiaphragm is seen. Musculoskeletal: Degenerative changes of the thoracic spine are noted. No acute bony abnormality is seen. Review of the MIP images confirms the above findings. IMPRESSION: No evidence of pulmonary emboli. Overall stable dilatation of the ascending aorta when compared with the prior exam although some cardiac motion artifact is noted precluding adequate measurements of the aorta. No dissection is seen. Recommend annual imaging followup by CTA or MRA. This  recommendation follows 2010 ACCF/AHA/AATS/ACR/ASA/SCA/SCAI/SIR/STS/SVM Guidelines for the Diagnosis and Management of Patients with Thoracic Aortic Disease. Circulation. 2010; 121: Q657-Q469. Aortic aneurysm NOS (ICD10-I71.9)  Mild left basilar atelectasis. Fatty infiltration of the liver. Aortic Atherosclerosis (ICD10-I70.0). Aortic aneurysm NOS (ICD10-I71.9). Electronically Signed   By: Inez Catalina M.D.   On: 10/23/2019 21:14   CT Abdomen Pelvis W Contrast  Result Date: 10/23/2019 CLINICAL DATA:  Jaundice and syncopal episode EXAM: CT ABDOMEN AND PELVIS WITH CONTRAST TECHNIQUE: Multidetector CT imaging of the abdomen and pelvis was performed using the standard protocol following bolus administration of intravenous contrast. CONTRAST:  147mL OMNIPAQUE IOHEXOL 350 MG/ML SOLN COMPARISON:  None. FINDINGS: Lower chest: There is mild cardiomegaly. Dense coronary artery calcifications are seen. There is elevation of the left hemidiaphragm. The visualized portions of the lungs are clear. Hepatobiliary: There is diffuse low density seen throughout the liver parenchyma. No focal hepatic lesion is seen.The main portal vein is patent. There is hyperenhancement of the gallbladder wall with a small amount of mesenteric fat stranding changes. No calcified gallstones however are noted. Pancreas: Unremarkable. No pancreatic ductal dilatation or surrounding inflammatory changes. Spleen: Normal in size without focal abnormality. Adrenals/Urinary Tract: Both adrenal glands appear normal. The kidneys and collecting system appear normal without evidence of urinary tract calculus or hydronephrosis. Bladder is unremarkable. Stomach/Bowel: The stomach and small bowel are normal in size and contour. There small foci of air seen adjacent to the anterior liver which are thought to be in decompressed hepatic flexure. The remainder of the colon is unremarkable. Vascular/Lymphatic: There are no enlarged mesenteric, retroperitoneal, or pelvic  lymph nodes. Scattered aortic atherosclerotic calcifications are seen without aneurysmal dilatation. Reproductive: The prostate is unremarkable. Other: No evidence of abdominal wall mass or hernia. Musculoskeletal: No acute or significant osseous findings. Degenerative changes are seen throughout the thoracolumbar spine. Endplate sclerosis and cystic changes most notable at L3-L4. IMPRESSION: 1. Hyperenhancement of the gallbladder wall with mild surrounding inflammatory changes, however no definite calcified gallstones are seen. This could be due to early acute acalculous cholecystitis. If further evaluation is required would recommend right upper quadrant ultrasound. 2. Diverticulosis without diverticulitis. 3.  Aortic Atherosclerosis (ICD10-I70.0). 4. Hepatic steatosis 5. Dense coronary artery calcifications. Electronically Signed   By: Prudencio Pair M.D.   On: 10/23/2019 21:15    Pending Labs Unresulted Labs (From admission, onward)    Start     Ordered   10/24/19 0500  Comprehensive metabolic panel  Tomorrow morning,   R    Question:  Release to patient  Answer:  Immediate   10/23/19 2218   10/24/19 0500  CBC  Tomorrow morning,   R    Question:  Release to patient  Answer:  Immediate   10/23/19 2218   10/24/19 0500  TSH  Tomorrow morning,   R    Question:  Release to patient  Answer:  Immediate   10/23/19 2218   10/23/19 2139  SARS CORONAVIRUS 2 (TAT 6-24 HRS) Nasopharyngeal Nasopharyngeal Swab  (Tier 3 (TAT 6-24 hrs))  Once,   STAT    Question Answer Comment  Is this test for diagnosis or screening Screening   Symptomatic for COVID-19 as defined by CDC No   Hospitalized for COVID-19 No   Admitted to ICU for COVID-19 No   Previously tested for COVID-19 No   Resident in a congregate (group) care setting No   Employed in healthcare setting No      10/23/19 2139          Vitals/Pain Today's Vitals   10/23/19 2230 10/23/19 2318 10/23/19 2319 10/23/19 2330  BP: 118/74 121/80  110/79   Pulse:  93 95 92 94  Resp: 17 (!) 25 19 20   Temp:      TempSrc:      SpO2: 95% 98% 95% 97%  Weight:      Height:      PainSc:        Isolation Precautions No active isolations  Medications Medications  sodium chloride flush (NS) 0.9 % injection 3 mL (has no administration in time range)  0.9 %  sodium chloride infusion (has no administration in time range)  feeding supplement (PRO-STAT SUGAR FREE 64) liquid 30 mL (30 mLs Oral Given 10/23/19 2335)  chlordiazePOXIDE (LIBRIUM) capsule 25 mg (has no administration in time range)  LORazepam (ATIVAN) tablet 1-4 mg (has no administration in time range)    Or  LORazepam (ATIVAN) injection 1-4 mg (has no administration in time range)  thiamine tablet 100 mg (has no administration in time range)    Or  thiamine (B-1) injection 100 mg (has no administration in time range)  folic acid (FOLVITE) tablet 1 mg (has no administration in time range)  multivitamin with minerals tablet 1 tablet (has no administration in time range)  LORazepam (ATIVAN) injection 0-4 mg (0 mg Intravenous Not Given 10/23/19 2330)    Followed by  LORazepam (ATIVAN) injection 0-4 mg (has no administration in time range)  sodium chloride 0.9 % bolus 1,000 mL (0 mLs Intravenous Stopped 10/23/19 2151)  iohexol (OMNIPAQUE) 350 MG/ML injection 100 mL (100 mLs Intravenous Contrast Given 10/23/19 2043)  chlordiazePOXIDE (LIBRIUM) capsule 50 mg (50 mg Oral Given 10/23/19 2330)    Mobility walks Low fall risk   Focused Assessments    R Recommendations: See Admitting Provider Note  Report given to: Valentino Saxon [phone]  Additional Notes:

## 2019-10-23 NOTE — ED Notes (Signed)
Pt has the following extra tubes in main lab:  1 dark green on ice for ammonia level 1 dark green 1 blue top 2 gold tops

## 2019-10-24 ENCOUNTER — Observation Stay (HOSPITAL_BASED_OUTPATIENT_CLINIC_OR_DEPARTMENT_OTHER): Payer: No Typology Code available for payment source

## 2019-10-24 ENCOUNTER — Observation Stay (HOSPITAL_COMMUNITY)
Admit: 2019-10-24 | Discharge: 2019-10-24 | Disposition: A | Discharge status: Home / Self Care | Payer: No Typology Code available for payment source | Attending: Internal Medicine | Admitting: Internal Medicine

## 2019-10-24 ENCOUNTER — Encounter (HOSPITAL_COMMUNITY): Payer: Self-pay | Admitting: Internal Medicine

## 2019-10-24 DIAGNOSIS — D539 Nutritional anemia, unspecified: Secondary | ICD-10-CM | POA: Diagnosis present

## 2019-10-24 DIAGNOSIS — I10 Essential (primary) hypertension: Secondary | ICD-10-CM | POA: Diagnosis present

## 2019-10-24 DIAGNOSIS — G9341 Metabolic encephalopathy: Secondary | ICD-10-CM | POA: Diagnosis not present

## 2019-10-24 DIAGNOSIS — K7010 Alcoholic hepatitis without ascites: Secondary | ICD-10-CM | POA: Diagnosis present

## 2019-10-24 DIAGNOSIS — R55 Syncope and collapse: Secondary | ICD-10-CM | POA: Diagnosis present

## 2019-10-24 DIAGNOSIS — R945 Abnormal results of liver function studies: Secondary | ICD-10-CM

## 2019-10-24 DIAGNOSIS — I6523 Occlusion and stenosis of bilateral carotid arteries: Secondary | ICD-10-CM | POA: Diagnosis present

## 2019-10-24 DIAGNOSIS — Z6823 Body mass index (BMI) 23.0-23.9, adult: Secondary | ICD-10-CM | POA: Diagnosis not present

## 2019-10-24 DIAGNOSIS — E43 Unspecified severe protein-calorie malnutrition: Secondary | ICD-10-CM | POA: Diagnosis not present

## 2019-10-24 DIAGNOSIS — Z20822 Contact with and (suspected) exposure to covid-19: Secondary | ICD-10-CM | POA: Diagnosis present

## 2019-10-24 DIAGNOSIS — F1010 Alcohol abuse, uncomplicated: Secondary | ICD-10-CM | POA: Diagnosis not present

## 2019-10-24 DIAGNOSIS — Z85118 Personal history of other malignant neoplasm of bronchus and lung: Secondary | ICD-10-CM | POA: Diagnosis not present

## 2019-10-24 DIAGNOSIS — J449 Chronic obstructive pulmonary disease, unspecified: Secondary | ICD-10-CM | POA: Diagnosis present

## 2019-10-24 DIAGNOSIS — E440 Moderate protein-calorie malnutrition: Secondary | ICD-10-CM | POA: Diagnosis present

## 2019-10-24 DIAGNOSIS — Z801 Family history of malignant neoplasm of trachea, bronchus and lung: Secondary | ICD-10-CM | POA: Diagnosis not present

## 2019-10-24 DIAGNOSIS — Z781 Physical restraint status: Secondary | ICD-10-CM | POA: Diagnosis not present

## 2019-10-24 DIAGNOSIS — F10229 Alcohol dependence with intoxication, unspecified: Secondary | ICD-10-CM | POA: Diagnosis present

## 2019-10-24 DIAGNOSIS — R17 Unspecified jaundice: Secondary | ICD-10-CM | POA: Diagnosis not present

## 2019-10-24 DIAGNOSIS — E871 Hypo-osmolality and hyponatremia: Secondary | ICD-10-CM | POA: Diagnosis present

## 2019-10-24 DIAGNOSIS — I472 Ventricular tachycardia: Secondary | ICD-10-CM | POA: Diagnosis present

## 2019-10-24 DIAGNOSIS — J9811 Atelectasis: Secondary | ICD-10-CM | POA: Diagnosis present

## 2019-10-24 DIAGNOSIS — F10232 Alcohol dependence with withdrawal with perceptual disturbance: Secondary | ICD-10-CM | POA: Diagnosis not present

## 2019-10-24 DIAGNOSIS — F10231 Alcohol dependence with withdrawal delirium: Secondary | ICD-10-CM | POA: Diagnosis not present

## 2019-10-24 DIAGNOSIS — Z86010 Personal history of colonic polyps: Secondary | ICD-10-CM | POA: Diagnosis not present

## 2019-10-24 DIAGNOSIS — R410 Disorientation, unspecified: Secondary | ICD-10-CM | POA: Diagnosis not present

## 2019-10-24 DIAGNOSIS — D696 Thrombocytopenia, unspecified: Secondary | ICD-10-CM | POA: Diagnosis present

## 2019-10-24 DIAGNOSIS — D689 Coagulation defect, unspecified: Secondary | ICD-10-CM | POA: Diagnosis present

## 2019-10-24 DIAGNOSIS — E876 Hypokalemia: Secondary | ICD-10-CM | POA: Diagnosis not present

## 2019-10-24 LAB — COMPREHENSIVE METABOLIC PANEL
ALT: 31 U/L (ref 0–44)
AST: 123 U/L — ABNORMAL HIGH (ref 15–41)
Albumin: 2.5 g/dL — ABNORMAL LOW (ref 3.5–5.0)
Alkaline Phosphatase: 124 U/L (ref 38–126)
Anion gap: 9 (ref 5–15)
BUN: 13 mg/dL (ref 8–23)
CO2: 27 mmol/L (ref 22–32)
Calcium: 8.2 mg/dL — ABNORMAL LOW (ref 8.9–10.3)
Chloride: 99 mmol/L (ref 98–111)
Creatinine, Ser: 1.33 mg/dL — ABNORMAL HIGH (ref 0.61–1.24)
GFR calc Af Amer: >60 mL/min (ref >60)
GFR calc non Af Amer: 53 mL/min — ABNORMAL LOW (ref >60)
Glucose, Bld: 132 mg/dL — ABNORMAL HIGH (ref 70–99)
Potassium: 4.4 mmol/L (ref 3.5–5.1)
Sodium: 135 mmol/L (ref 135–145)
Total Bilirubin: 6.4 mg/dL — ABNORMAL HIGH (ref 0.3–1.2)
Total Protein: 6.1 g/dL — ABNORMAL LOW (ref 6.5–8.1)

## 2019-10-24 LAB — SARS CORONAVIRUS 2 (TAT 6-24 HRS): SARS Coronavirus 2: NEGATIVE

## 2019-10-24 LAB — CBC
HCT: 29.5 % — ABNORMAL LOW (ref 39.0–52.0)
Hemoglobin: 10.3 g/dL — ABNORMAL LOW (ref 13.0–17.0)
MCH: 36.7 pg — ABNORMAL HIGH (ref 26.0–34.0)
MCHC: 34.9 g/dL (ref 30.0–36.0)
MCV: 105 fL — ABNORMAL HIGH (ref 80.0–100.0)
Platelets: 149 10*3/uL — ABNORMAL LOW (ref 150–400)
RBC: 2.81 MIL/uL — ABNORMAL LOW (ref 4.22–5.81)
RDW: 18.8 % — ABNORMAL HIGH (ref 11.5–15.5)
WBC: 8.7 10*3/uL (ref 4.0–10.5)
nRBC: 0 % (ref 0.0–0.2)

## 2019-10-24 LAB — RETICULOCYTES
Immature Retic Fract: 6 % (ref 2.3–15.9)
RBC.: 2.96 MIL/uL — ABNORMAL LOW (ref 4.22–5.81)
Retic Count, Absolute: 60.1 10*3/uL (ref 19.0–186.0)
Retic Ct Pct: 2 % (ref 0.4–3.1)

## 2019-10-24 LAB — IRON AND TIBC
Iron: 108 ug/dL (ref 45–182)
Saturation Ratios: 93 % — ABNORMAL HIGH (ref 17.9–39.5)
TIBC: 116 ug/dL — ABNORMAL LOW (ref 250–450)
UIBC: 8 ug/dL

## 2019-10-24 LAB — VITAMIN B12: Vitamin B-12: 609 pg/mL (ref 180–914)

## 2019-10-24 LAB — TROPONIN I (HIGH SENSITIVITY)
Troponin I (High Sensitivity): 6 ng/L (ref <18)
Troponin I (High Sensitivity): 10 ng/L (ref <18)

## 2019-10-24 LAB — HEPATITIS A ANTIBODY, TOTAL: hep A Total Ab: NONREACTIVE

## 2019-10-24 LAB — BILIRUBIN, DIRECT: Bilirubin, Direct: 3.8 mg/dL — ABNORMAL HIGH (ref 0.0–0.2)

## 2019-10-24 LAB — FOLATE: Folate: 6.6 ng/mL (ref >5.9)

## 2019-10-24 LAB — FERRITIN: Ferritin: 2466 ng/mL — ABNORMAL HIGH (ref 24–336)

## 2019-10-24 LAB — HEPATITIS B CORE ANTIBODY, TOTAL: Hep B Core Total Ab: NONREACTIVE

## 2019-10-24 LAB — ECHOCARDIOGRAM COMPLETE
Height: 71 in
Weight: 2662.4 oz

## 2019-10-24 LAB — TSH: TSH: 1.612 u[IU]/mL (ref 0.350–4.500)

## 2019-10-24 LAB — SODIUM, URINE, RANDOM: Sodium, Ur: 20 mmol/L

## 2019-10-24 MED ORDER — LOSARTAN POTASSIUM 50 MG PO TABS
100.0000 mg | ORAL_TABLET | Freq: Every day | ORAL | Status: DC
  Administered 2019-10-24 – 2019-10-29 (×6): 100 mg via ORAL
  Filled 2019-10-24 (×6): qty 2

## 2019-10-24 MED ORDER — LORAZEPAM 2 MG/ML IJ SOLN: 1.0000 mg | Freq: Once | INTRAMUSCULAR | Status: DC

## 2019-10-24 MED ORDER — ENSURE ENLIVE PO LIQD
237.0000 mL | Freq: Two times a day (BID) | ORAL | Status: DC
  Administered 2019-10-25 – 2019-10-29 (×5): 237 mL via ORAL

## 2019-10-24 NOTE — Progress Notes (Signed)
Carotid artery duplex completed. Refer to "CV Proc" under chart review to view preliminary results.  10/24/2019 9:49 AM Kelby Aline., MHA, RVT, RDCS, RDMS

## 2019-10-24 NOTE — Progress Notes (Signed)
EEG complete - results pending 

## 2019-10-24 NOTE — Consult Note (Signed)
[facility] Consult: 10:37 AM 10/24/2019  LOS: 0 days    Referring Provider: Dr Aileen Fass  Primary Care Physician:  Lawerance Cruel, MD Primary Gastroenterologist:  Dr. Fuller Plan.    Reason for Consultation: Elevated LFTs.   HPI: Richard Irwin is a 74 y.o. male.  PMH HTN.  EtOH abuse.  COPD.  Thoracic ascending aortic aneurysm.  Unexplained syncope 2009. Surgeries include appendectomy, herniorrhaphy, rotator cuff repair, hemorrhoidectomy.  02/2009 Colonoscopy.  Average risk screening study.  7 polyps removed at various locations in the colon.  Both of these polyps were adenomatous without HGD, some fragments of hyperplastic polyp as well. 07/08/2014 Colonoscopy.  Multiple polyps removed from cecum, ascending, transverse, descending colon.  Mild sigmoid diverticulosis. Developed post polypectomy bleed requiring hospital admission.07/09/2014 07/09/14 Colonoscopy with control of bleeding, 3 endoclips placed, submucosal injection at site of bleeding in descending colon.  Allergy all consistent with adenomas, no HGD.  Sudden, acute, brief syncopal episode while standing at home.  Wife says his eye rolled to the back of his head, no obvious tonic-clonic movement.  Ended up on the floor, episode lasted a few seconds.  PT denies any prodrome, but does recall the previous unexplained syncope many years ago.  Patient had had maybe a couple of beers before the event.  At EMS arrival BP 92/63, heart rate 101, 97% oxygenation. Hgb 12 >> 10.3.  Was 14.6 in 07/2014.  MCV 105.  Platelets 149K. Folate, B12, iron normal.  Low TIBC.  Elevated iron saturation and Ferritin. BUN/creatinine 13/1.4.  T bili 8.2>> 6.4.  Alkaline phosphatase 158 >> 124.  AST/ALT 156/39 >> 123/31.  Ammonia 38. Acute Hep ABC serologies negative.  CTAP with  contrast: Hyperenhancing GB wall with surrounding inflammatory changes.  No definite gallstones.  ?  Early, acute acalculous cholecystitis.  Main PV patent.  No biliary or pancreatic ductal dilatation.  Colonic diverticulosis.  Aortic atherosclerosis.  Hepatic steatosis.  Dense coronary artery calcifications. CT brain: Chronic small vessel ischemia, age-related atrophy. CTA chest: No PE.  Stable ascending aortic aneurysm.  Fatty liver.  Patient has upcoming appt with Dr. Fuller Plan in early March.  Pt wanted to discuss change in caliber of bowel movement without bleeding, color change of BM.  Appetite is stable but he is lost 15 to 20 pounds in the last 5 months.  No abdominal pain, N/V.  In the last few weeks he has experienced bloating and perceived swelling in his abdomen.  Within the last several days his wife noticed that the sclera was icteric. Patient drinks up to 4 beers daily and if he gets around his buddies he may drink 6 beers in a day.  Additionally he might drink a screwdriver (vodka and orange juice) as well.  PMD had advised him in past years that he needed to cut back on his drinking since his LFTs were elevated. He lives with his wife.  He is retired from [employer].    Past Medical History:  Diagnosis Date  . Acute blood loss anemia   . Anemia   .  Diverticulosis   . Emphysema/COPD ([place])   . H/O: GI bleed   . Hypertension   . Insomnia   . Lung cancer ([place])   . Status post colonoscopy with polypectomy    with bleed  . Thoracic ascending aortic aneurysm ([place]) 05/07/2013  . Tubular adenoma     Past Surgical History:  Procedure Laterality Date  . APPENDECTOMY    . COLONOSCOPY WITH PROPOFOL N/A 07/09/2014   Procedure: COLONOSCOPY WITH PROPOFOL;  Surgeon: Irene Shipper, MD;  Location: [facility];  Service: Endoscopy;  Laterality: N/A;  . EXCISIONAL HEMORRHOIDECTOMY    . HERNIA REPAIR    . ROTATOR CUFF REPAIR      Prior to Admission medications   Medication Sig  Start Date End Date Taking? Authorizing Provider  losartan (COZAAR) 100 MG tablet Take 100 mg by mouth daily.    Yes [provider]  ferrous sulfate 325 (65 FE) MG EC tablet Take 1 tablet (325 mg total) by mouth daily with breakfast. Patient not taking: Reported on 10/23/2019 07/10/14   Janece Canterbury, MD    Scheduled Meds: . chlordiazePOXIDE  25 mg Oral Once  . feeding supplement (PRO-STAT SUGAR FREE 64)  30 mL Oral BID  . folic acid  1 mg Oral Daily  . LORazepam  0-4 mg Intravenous Q6H   Followed by  . [START ON 10/26/2019] LORazepam  0-4 mg Intravenous Q12H  . losartan  100 mg Oral Daily  . multivitamin with minerals  1 tablet Oral Daily  . sodium chloride flush  3 mL Intravenous Once  . thiamine  100 mg Oral Daily   Or  . thiamine  100 mg Intravenous Daily   Infusions:  PRN Meds: LORazepam **OR** LORazepam   Allergies as of 10/23/2019 - Review Complete 10/23/2019  Allergen Reaction Noted  . Lisinopril Cough 05/06/2013    Family History  Problem Relation Age of Onset  . Cirrhosis Father   . Lung cancer Mother   . Colon cancer Neg Hx   . Pancreatic cancer Neg Hx   . Rectal cancer Neg Hx   . Stomach cancer Neg Hx     Social History   Socioeconomic History  . Marital status: Married    Spouse name: Not on file  . Number of children: 1  . Years of education: Not on file  . Highest education level: Not on file  Occupational History  . Occupation: retired Press photographer  Tobacco Use  . Smoking status: Never Smoker  . Smokeless tobacco: Never Used  Substance and Sexual Activity  . Alcohol use: Yes    Comment: beer 2-4 daily  . Drug use: No  . Sexual activity: Yes  Other Topics Concern  . Not on file  Social History Narrative  . Not on file   Social Determinants of Health   Financial Resource Strain:   . Difficulty of Paying Living Expenses: Not on file  Food Insecurity:   . Worried About Charity fundraiser in the Last Year: Not on file  . Ran Out of  Food in the Last Year: Not on file  Transportation Needs:   . Lack of Transportation (Medical): Not on file  . Lack of Transportation (Non-Medical): Not on file  Physical Activity:   . Days of Exercise per Week: Not on file  . Minutes of Exercise per Session: Not on file  Stress:   . Feeling of Stress : Not on file  Social Connections:   . Frequency of  Communication with Friends and Family: Not on file  . Frequency of Social Gatherings with Friends and Family: Not on file  . Attends Religious Services: Not on file  . Active Member of Clubs or Organizations: Not on file  . Attends Archivist Meetings: Not on file  . Marital Status: Not on file  Intimate Partner Violence:   . Fear of Current or Ex-Partner: Not on file  . Emotionally Abused: Not on file  . Physically Abused: Not on file  . Sexually Abused: Not on file    REVIEW OF SYSTEMS: Constitutional: Other than the syncopal spell yesterday, no weakness, no dizziness. ENT:  No nose bleeds Pulm: Shortness of breath, no cough. CV:  No palpitations, no LE edema.  No angina GU:  No hematuria, no frequency GI: See HPI.  No dysphagia. Heme: Denies excessive or unusual bleeding/bruising. Transfusions: No prior blood transfusions. Neuro: Brief syncope yesterday as above.  No headaches, no peripheral tingling or numbness Derm:  No itching, no rash or sores.  Endocrine:  No sweats or chills.  No polyuria or dysuria Immunization: Patient is current on multiple vaccinations.  He is due to get his 1st Covid vaccination on 2/6. Travel:  None beyond local counties in last few months.    PHYSICAL EXAM: Vital signs in last 24 hours: Vitals:   10/24/19 0113 10/24/19 0601  BP: 105/67 114/78  Pulse: 85 (!) 110  Resp: 18 20  Temp: 98.9 F (37.2 C) 98.4 F (36.9 C)  SpO2: 94% 90%   Wt Readings from Last 3 Encounters:  10/24/19 75.5 kg  07/24/14 81.8 kg  07/09/14 81.1 kg    General: Pleasant, slightly jaundiced WM who is  comfortable and looks a bit chronically ill but not acutely ill. Head: No facial asymmetry or swelling.  No signs of head trauma. Eyes: Slight scleral icterus.  No conjunctival pallor.  EOMI. Ears: Not hard of hearing Nose: No congestion, no discharge. Mouth: Tongue midline.  Good dentition.  Oral mucosa moist, pink, clear. Neck: No thyromegaly, no JVD.  No masses. Lungs: Clear bilaterally.  No labored breathing or cough. Heart: RRR.  No MRG.  S1, S2 present. Abdomen: Liver edge palpable just below CVA.  There is no tenderness there or elsewhere.  No masses.  Bowel sounds active.  No distention, soft..   Rectal: Deferred. Musc/Skeltl: No joint redness, swelling or gross deformity. Extremities: No CCE.  Feet are warm. Neurologic: Bilateral upper extremities/hand tremor.  No asterixis. Skin: No suspicious rash, sores, lesions. Tattoos: None. Nodes: No cervical adenopathy. Psych: Cooperative, calm, pleasant, fluid speech.  Intake/Output from previous day: 02/03 0701 - 02/04 0700 In: 1127.6 [I.V.:127.6; IV Piggyback:1000] Out: 900 [Urine:900] Intake/Output this shift: No intake/output data recorded.  LAB RESULTS: Recent Labs    10/23/19 1814 10/24/19 0253  WBC 9.2 8.7  HGB 12.0* 10.3*  HCT 35.2* 29.5*  PLT 199 149*   BMET Lab Results  Component Value Date   NA 135 10/24/2019   NA 133 (L) 10/23/2019   NA 134 (L) 07/09/2014   K 4.4 10/24/2019   K 3.9 10/23/2019   K 3.3 (L) 07/09/2014   CL 99 10/24/2019   CL 93 (L) 10/23/2019   CL 98 07/09/2014   CO2 27 10/24/2019   CO2 26 10/23/2019   CO2 20 07/09/2014   GLUCOSE 132 (H) 10/24/2019   GLUCOSE 145 (H) 10/23/2019   GLUCOSE 113 (H) 07/09/2014   BUN 13 10/24/2019   BUN 13 10/23/2019  BUN 8 07/09/2014   CREATININE 1.33 (H) 10/24/2019   CREATININE 1.45 (H) 10/23/2019   CREATININE 0.85 07/09/2014   CALCIUM 8.2 (L) 10/24/2019   CALCIUM 8.8 (L) 10/23/2019   CALCIUM 9.2 07/09/2014   LFT Recent Labs    10/23/19 1814  10/24/19 0253 10/24/19 0610  PROT 7.5 6.1*  --   ALBUMIN 3.0* 2.5*  --   AST 156* 123*  --   ALT 39 31  --   ALKPHOS 158* 124  --   BILITOT 8.2* 6.4*  --   BILIDIR  --   --  3.8*   PT/INR Lab Results  Component Value Date   INR 1.3 (H) 10/23/2019   Hepatitis Panel Recent Labs    10/23/19 2010  HEPBSAG NON REACTIVE  HCVAB NON REACTIVE  HEPAIGM NON REACTIVE  HEPBIGM NON REACTIVE   C-Diff No components found for: CDIFF Lipase  No results found for: LIPASE  Drugs of Abuse  No results found for: LABOPIA, COCAINSCRNUR, LABBENZ, AMPHETMU, THCU, LABBARB   RADIOLOGY STUDIES: CT Head Wo Contrast  Result Date: 10/23/2019 CLINICAL DATA:  Head trauma after syncopal episode EXAM: CT HEAD WITHOUT CONTRAST TECHNIQUE: Contiguous axial images were obtained from the base of the skull through the vertex without intravenous contrast. COMPARISON:  August 29, 2008 FINDINGS: Brain: No evidence of acute territorial infarction, hemorrhage, hydrocephalus,extra-axial collection or mass lesion/mass effect. There is dilatation the ventricles and sulci consistent with age-related atrophy. Low-attenuation changes in the deep white matter consistent with small vessel ischemia. Vascular: No hyperdense vessel or unexpected calcification. Skull: The skull is intact. No fracture or focal lesion identified. Sinuses/Orbits: The visualized paranasal sinuses and mastoid air cells are clear. The orbits and globes intact. Other: None IMPRESSION: No acute intracranial abnormality. Findings consistent with age related atrophy and chronic small vessel ischemia Electronically Signed   By: Prudencio Pair M.D.   On: 10/23/2019 21:08   CT Angio Chest PE W/Cm &/Or Wo Cm  Result Date: 10/23/2019 CLINICAL DATA:  Follow-up thoracic aneurysm, history of syncopal episode today EXAM: CT ANGIOGRAPHY CHEST WITH CONTRAST TECHNIQUE: Multidetector CT imaging of the chest was performed using the standard protocol during bolus administration  of intravenous contrast. Multiplanar CT image reconstructions and MIPs were obtained to evaluate the vascular anatomy. CONTRAST:  13mL OMNIPAQUE IOHEXOL 350 MG/ML SOLN COMPARISON:  05/03/2013 FINDINGS: Cardiovascular: Thoracic aorta again demonstrates atherosclerotic calcifications. Considerable motion artifact somewhat limits evaluation of the ascending aorta. Dilatation is noted similar to that seen on the prior exam primarily at the level of the aortic root and sino-tubular junction. The ascending aorta more distally measures 4 cm in greatest dimension. No evidence of dissection is seen. Normal tapering in the aortic arch and descending aorta are seen. No cardiac enlargement is seen. The pulmonary artery shows a normal branching pattern although no definitive filling defect to suggest pulmonary embolism is seen. Coronary calcifications are noted. No pericardial effusion is seen. Mediastinum/Nodes: Thoracic inlet is within normal limits. No sizable hilar or mediastinal adenopathy is noted. The esophagus as visualized is within normal limits. Lungs/Pleura: The lungs are well aerated bilaterally. Mild left basilar atelectasis is seen. No focal confluent infiltrate is noted. No sizable effusion or parenchymal nodules are seen. Upper Abdomen: Visualized upper abdomen shows fatty infiltration of the liver. No other focal abnormality is noted. Elevation of the left hemidiaphragm is seen. Musculoskeletal: Degenerative changes of the thoracic spine are noted. No acute bony abnormality is seen. Review of the MIP images confirms the above  findings. IMPRESSION: No evidence of pulmonary emboli. Overall stable dilatation of the ascending aorta when compared with the prior exam although some cardiac motion artifact is noted precluding adequate measurements of the aorta. No dissection is seen. Recommend annual imaging followup by CTA or MRA. This recommendation follows 2010 ACCF/AHA/AATS/ACR/ASA/SCA/SCAI/SIR/STS/SVM Guidelines  for the Diagnosis and Management of Patients with Thoracic Aortic Disease. Circulation. 2010; 121: X211-H417. Aortic aneurysm NOS (ICD10-I71.9) Mild left basilar atelectasis. Fatty infiltration of the liver. Aortic Atherosclerosis (ICD10-I70.0). Aortic aneurysm NOS (ICD10-I71.9). Electronically Signed   By: Inez Catalina M.D.   On: 10/23/2019 21:14   CT Abdomen Pelvis W Contrast  Result Date: 10/23/2019 CLINICAL DATA:  Jaundice and syncopal episode EXAM: CT ABDOMEN AND PELVIS WITH CONTRAST TECHNIQUE: Multidetector CT imaging of the abdomen and pelvis was performed using the standard protocol following bolus administration of intravenous contrast. CONTRAST:  190mL OMNIPAQUE IOHEXOL 350 MG/ML SOLN COMPARISON:  None. FINDINGS: Lower chest: There is mild cardiomegaly. Dense coronary artery calcifications are seen. There is elevation of the left hemidiaphragm. The visualized portions of the lungs are clear. Hepatobiliary: There is diffuse low density seen throughout the liver parenchyma. No focal hepatic lesion is seen.The main portal vein is patent. There is hyperenhancement of the gallbladder wall with a small amount of mesenteric fat stranding changes. No calcified gallstones however are noted. Pancreas: Unremarkable. No pancreatic ductal dilatation or surrounding inflammatory changes. Spleen: Normal in size without focal abnormality. Adrenals/Urinary Tract: Both adrenal glands appear normal. The kidneys and collecting system appear normal without evidence of urinary tract calculus or hydronephrosis. Bladder is unremarkable. Stomach/Bowel: The stomach and small bowel are normal in size and contour. There small foci of air seen adjacent to the anterior liver which are thought to be in decompressed hepatic flexure. The remainder of the colon is unremarkable. Vascular/Lymphatic: There are no enlarged mesenteric, retroperitoneal, or pelvic lymph nodes. Scattered aortic atherosclerotic calcifications are seen without  aneurysmal dilatation. Reproductive: The prostate is unremarkable. Other: No evidence of abdominal wall mass or hernia. Musculoskeletal: No acute or significant osseous findings. Degenerative changes are seen throughout the thoracolumbar spine. Endplate sclerosis and cystic changes most notable at L3-L4. IMPRESSION: 1. Hyperenhancement of the gallbladder wall with mild surrounding inflammatory changes, however no definite calcified gallstones are seen. This could be due to early acute acalculous cholecystitis. If further evaluation is required would recommend right upper quadrant ultrasound. 2. Diverticulosis without diverticulitis. 3.  Aortic Atherosclerosis (ICD10-I70.0). 4. Hepatic steatosis 5. Dense coronary artery calcifications. Electronically Signed   By: Prudencio Pair M.D.   On: 10/23/2019 21:15   VAS US CAROTID  Result Date: 10/24/2019 Carotid Arterial Duplex Study Indications:       Syncope. Risk Factors:      Hypertension. Comparison Study:  No prior study Performing Technologist: Maudry Mayhew MHA, RDMS, RVT, RDCS  Examination Guidelines: A complete evaluation includes B-mode imaging, spectral Doppler, color Doppler, and power Doppler as needed of all accessible portions of each vessel. Bilateral testing is considered an integral part of a complete examination. Limited examinations for reoccurring indications may be performed as noted.  Right Carotid Findings: +----------+--------+--------+--------+-----------------------+--------+           PSV cm/sEDV cm/sStenosisPlaque Description     Comments +----------+--------+--------+--------+-----------------------+--------+ CCA Prox  78      18              smooth and heterogenous         +----------+--------+--------+--------+-----------------------+--------+ CCA Distal66      20                                              +----------+--------+--------+--------+-----------------------+--------+  ICA Prox  93      24               calcific                        +----------+--------+--------+--------+-----------------------+--------+ ICA Distal140     36                                              +----------+--------+--------+--------+-----------------------+--------+ ECA       120     16              calcific                        +----------+--------+--------+--------+-----------------------+--------+ +----------+--------+-------+----------------+-------------------+           PSV cm/sEDV cmsDescribe        Arm Pressure (mmHG) +----------+--------+-------+----------------+-------------------+ [id number]             Multiphasic, WNL                    +----------+--------+-------+----------------+-------------------+ +---------+--------+--+--------+-+---------+ VertebralPSV cm/s17EDV cm/s6Antegrade +---------+--------+--+--------+-+---------+  Left Carotid Findings: +----------+-------+-------+--------+---------------------------------+--------+           PSV    EDV    StenosisPlaque Description               Comments           cm/s   cm/s                                                     +----------+-------+-------+--------+---------------------------------+--------+ CCA Prox  101    23                                                       +----------+-------+-------+--------+---------------------------------+--------+ CCA Distal122    30             irregular and heterogenous                +----------+-------+-------+--------+---------------------------------+--------+ ICA Prox  52     16             irregular, heterogenous and                                               calcific                                  +----------+-------+-------+--------+---------------------------------+--------+ ICA Distal70     23             calcific                                  +----------+-------+-------+--------+---------------------------------+--------+  ECA       92     19                                                       +----------+-------+-------+--------+---------------------------------+--------+ +----------+--------+--------+----------------+-------------------+  PSV cm/sEDV cm/sDescribe        Arm Pressure (mmHG) +----------+--------+--------+----------------+-------------------+ [id number]             Multiphasic, WNL                    +----------+--------+--------+----------------+-------------------+ +---------+--------+--+--------+--+---------+ VertebralPSV cm/s40EDV cm/s13Antegrade +---------+--------+--+--------+--+---------+   Summary: Right Carotid: Velocities in the right ICA are consistent with a 1-39% stenosis. Left Carotid: Velocities in the left ICA are consistent with a 1-39% stenosis. Vertebrals:  Bilateral vertebral arteries demonstrate antegrade flow. Subclavians: Normal flow hemodynamics were seen in bilateral subclavian              arteries. *See table(s) above for measurements and observations.     Preliminary       IMPRESSION:   *  Syncope of unclear etiology.  Atrophy, small vessel disease per head CT.  Noncritical bilateral ICA stenosis per Dopplers.  *   Elevated LFTs.  ASTALT pattern c/w etoh hepatitis.  Pt abuses ETOH.  CT shows fatty liver, GB wall thickening w/o stones.  No symptoms to suggest cholecystitis. Discriminant fx score 17.4, so no indication for prednisolone.    *   Mild doagulopathy.      *   Hx adenomatous colon polyps 2010 and 2015.  Pot polypectomy bleed requiring repeat colonoscopy and endoclip hemostasis 2015. No response to call back letter sent 06/2017.  Overdue surveillance colonoscopy.   Pt cxld OV 08/26/19 with Dr. Fuller Plan .  Has upcoming appt with Dr. Fuller Plan 11/28/2019.  Pt reports unintentional wt loss and smaller calibre of stool.       *    Macrocytic anemia.  No evidence of B12, folate, iron deficiency. Thrombocytopenia.  *   Covid 19 negative.      PLAN:     *   Per Dr Rush Landmark.   Supportive care.  Watch for withdrawal sxs.  CMET, pt/inr in AM.     Azucena Freed  10/24/2019, 10:37 AM Phone [phone]

## 2019-10-24 NOTE — Procedures (Signed)
Patient Name: Richard Irwin  MRN: [id number]  Epilepsy Attending: Lora Havens  Referring Physician/Provider: Dr. Jani Gravel Date: 10/24/2019 Duration: 24.01 minutes  Patient history: 74 year old male with history of alcohol abuse who presented after syncopal episode.  EEG to evaluate for seizures.  Level of alertness: Awake  AEDs during EEG study: Lorazepam  Technical aspects: This EEG study was done with scalp electrodes positioned according to the 10-20 International system of electrode placement. Electrical activity was acquired at a sampling rate of 500Hz  and reviewed with a high frequency filter of 70Hz  and a low frequency filter of 1Hz . EEG data were recorded continuously and digitally stored.   Description: The posterior dominant rhythm consists of 8-9 Hz activity of moderate voltage (25-35 uV) seen predominantly in posterior head regions, symmetric and reactive to eye opening and eye closing. Hyperventilation and photic stimulation were not performed.  IMPRESSION: This study is within normal limits. No seizures or epileptiform discharges were seen throughout the recording.  Jandel Patriarca Barbra Sarks

## 2019-10-24 NOTE — Progress Notes (Signed)
Initial Nutrition Assessment  DOCUMENTATION CODES:   Non-severe (moderate) malnutrition in context of social or environmental circumstances  INTERVENTION:  -Ensure Enlive po BID, each supplement provides 350 kcal and 20 grams of protein (strawberry)  Continue MVI daily   NUTRITION DIAGNOSIS:   Moderate Malnutrition related to social / environmental circumstances(alcohol abuse) as evidenced by moderate muscle depletion, moderate fat depletion.   GOAL:   Patient will meet greater than or equal to 90% of their needs    MONITOR:   PO intake, Supplement acceptance, Weight trends, Labs, I & O's, Skin  REASON FOR ASSESSMENT:   Malnutrition Screening Tool    ASSESSMENT:  74 year old male with history of HTN, alcohol abuse, anemia, diverticulosis, h/o GI bleeding, COPD. Patient presented via EMS after syncope episode at the bar and admitted for syncope and abnormal liver function/juandice  Patient reports that he is ready to go home at RD visit this afternoon. Untouched lunch tray at bedside. Patient reports eating a late breakfast and requested that no lunch tray be sent. Patient recalls usual intake of 2 meals/day. He reports that he rarely eats breakfast, recalls sandwiches or leftovers for lunch. Patient stated that his wife his a wonderful cook and he enjoys her chicken parm, spaghetti, and lasagna. Patient endorses decreased appetite, stated that he is not as active as he used to be and his body does not need as much as it use to. Patient recalls UBW of 180 lbs, and endorses wt loss over the past 4-6 months.   Current wt 166.1 lbs Last weight prior to admission, 81.8 kg in 2015. Patient has lost 14 lbs (7.7%) per pt report of 180 lbs approximately 4-6 months ago which is insignificant for time frame, but concerning given advanced age and history of alcohol abuse. Patient amenable to strawberry flavor supplements during admission.   Medications reviewed and include: Pro-stat,  Folvite, MVI, Thiamine  Labs: CBGs 127 x 1, Cr 1.33 (H)  NUTRITION - FOCUSED PHYSICAL EXAM:    Most Recent Value  Orbital Region  Moderate depletion  Upper Arm Region  Moderate depletion  Thoracic and Lumbar Region  Unable to assess  Buccal Region  Moderate depletion  Temple Region  Mild depletion  Clavicle Bone Region  Mild depletion  Clavicle and Acromion Bone Region  Mild depletion  Scapular Bone Region  Unable to assess  Dorsal Hand  Moderate depletion  Patellar Region  Moderate depletion  Anterior Thigh Region  Unable to assess  Posterior Calf Region  Moderate depletion  Edema (RD Assessment)  None  Hair  Reviewed  Eyes  Reviewed  Mouth  Reviewed  Skin  Reviewed [juandice]  Nails  Reviewed       Diet Order:   Diet Order            Diet Heart Room service appropriate? Yes; Fluid consistency: Thin  Diet effective now              EDUCATION NEEDS:   Education needs have been addressed  Skin:  Skin Assessment: Reviewed RN Assessment  Last BM:  2/4 (smear per RN documentation)  Height:   Ht Readings from Last 1 Encounters:  10/23/19 5\' 11"  (1.803 m)    Weight:   Wt Readings from Last 1 Encounters:  10/24/19 75.5 kg    Ideal Body Weight:  78 kg  BMI:  Body mass index is 23.21 kg/m.  Estimated Nutritional Needs:   Kcal:  1800-2000  Protein:  90-100  Fluid:  >/=  1.8 L./day   Lajuan Lines, RD, [place] [facility] Telephone [phone] After Hours/Weekend Pager: [phone]

## 2019-10-24 NOTE — Consult Note (Signed)
Attending 52 Attestation   I have taken an interval history, reviewed the chart and examined the patient.   Please see consultation note by PA Gribbin for full details of HPI.  Her note will be formally signed on 10/25/2019 due to computer malfunction/epic malfunction.  This is a patient that the GI service is asked to evaluate in the setting of recent syncope and finding of abnormal liver biochemical testing.  He has a long history of alcohol use disorder.  Imaging at this point in time suggested the possibility of some underlying gallbladder pathology but the patient is asymptomatic from a pain perspective.  Patient drinks at least 5 drinks daily which can be beer or spirits.  He has done this for greater than 10 years.  Patient discriminate function did not meet criteria for attempt at prednisone use.  Patient's exam suggests some mild tremulousness but he is hemodynamically intact otherwise mentally intact as well.  He has evidence of telangiectasias on his upper thorax.  I suspect, the patient has underlying chronic liver disease and likely cirrhosis on top of this current status.  I suspect this is acute on chronic liver disease.  We have less than 24 hours worth of labs to trend however her bilirubin did decrease slightly from admission to now.  Hydration is important.  Nutrition is important.  Most important, is alcohol abstinence.  I spoke with him at great length today to describe what we need to do in order for him to be alive for many years.  I had opportunity speak with the patient's wife for greater than 15 minutes this evening as well to bring her up-to-date since she had not heard from anyone in the patient's team for nearly 24 hours.  She is concerned that as soon as the patient leaves the hospital he will go back to drinking.  He has had a weight loss as well as changes in bowel habits and was planned to see Dr. Fuller Plan in March.  I do agree that he will benefit from both an upper and  lower endoscopy but in the setting of his acute alcoholic hepatitis not clear that he will have benefit at this time and may be at increased risk due to the acute liver decompensation.  I think would be worthwhile for social work to evaluate the patient tomorrow.  Patient wife feels that he may have underlying depression that is causing him to drink alcohol excessively and maybe that is something we can also ask for an evaluation while he remains as an inpatient.  I have a high concern, that he will likely leave tomorrow prior to [place] getting him to the but he has capacity and can make that decision if he decides to.  He will need follow-up with Dr. Fuller Plan in the next few weeks if he ends up leaving the hospital soon.  We will work on arranging that when we know he is leaving the hospital.  I agree with the Advanced Practitioner's note, impression, and recommendations with updates and my documentation above.   Justice Britain, MD [facility] Advanced Endoscopy Office # [phone]

## 2019-10-24 NOTE — Progress Notes (Signed)
PROGRESS NOTE  CAMDIN HEGNER CZY:[id number] DOB: Jun 18, 1946 DOA: 10/23/2019 PCP: Lawerance Cruel, MD  HPI/Recap of past 24 hours: Richard Irwin  is a 74 y.o. male, w hypertension, Etoh abuse,  anemia, diverticulosis, h/o GI bleeding,  Copd, was at the bar standing at home when had syncope, per wife his eyes rolled back. No tongue laceration, no incontinence. No tonic clonic movement.  Pt states lasted a few sec.  Ended up on floor ,  Wife called EMS.    10/24/19: Seen and examined.  States he feels fine and wants to go home.  Reports the last time he had a syncopal episode was about 2 years ago and it was as if nothing happened.  Did not seek medical care for it.  Ongoing work-up for syncope.  Assessment/Plan: Principal Problem:   Syncope Active Problems:   Essential hypertension   Abnormal liver function   Alcohol abuse   Hyponatremia   Protein-calorie malnutrition, severe (HCC)   Syncope, possibly cardiac etiology Ongoing work-up 2D echo no LVH.  Abnormal septal motion with inferior basal hypokinesis.  LVEF 50 to 55%.  There was moderate to severe dilatation of the aortic root measuring 4.7 cm.  Consider follow-up CTA chest to size Orthostatic vital signs pending EEG unremarkable MRI brain, patient declines Continue fall precautions PT OT assessment  Hyperbilirubinemia likely in the setting of alcohol abuse Bilirubin 3.8 Repeat CMP in the morning  Elevated AST in the setting of alcohol abuse Continue to monitor  Alcohol abuse disorder Elevated alcohol level on presentation CIWA protocol Alcohol cessation counseling done at bedside  Essential hypertension Blood pressure stable Continue losartan  Code Status: Full code  Family Communication: None at bedside  Disposition Plan: Patient is from home.  Anticipate discharge to home.  Barrier to discharge ongoing syncope work-up.   Consultants:  None  Procedures:  EEG  Antimicrobials:  None  DVT  prophylaxis: SCDs   Objective: Vitals:   10/24/19 0113 10/24/19 0500 10/24/19 0601 10/24/19 1142  BP: 105/67   (!) 145/90  Pulse: 85  (!) 110 99  Resp: 18  20   Temp: 98.9 F (37.2 C)  98.4 F (36.9 C)   TempSrc: Oral  Oral   SpO2: 94%  90%   Weight:  75.5 kg    Height:        Intake/Output Summary (Last 24 hours) at 10/24/2019 1706 Last data filed at 10/24/2019 1316 Gross per 24 hour  Intake 1247.61 ml  Output 1300 ml  Net -52.39 ml   Filed Weights   10/23/19 1808 10/24/19 0500  Weight: 74.8 kg 75.5 kg    Exam:   General: 74 y.o. year-old male well developed well nourished in no acute distress.  Alert and oriented x3.  Cardiovascular: Regular rate and rhythm with no rubs or gallops.  No thyromegaly or JVD noted.    Respiratory: Clear to auscultation with no wheezes or rales. Good inspiratory effort.  Abdomen: Soft nontender nondistended with normal bowel sounds x4 quadrants.  Musculoskeletal: No lower extremity edema. 2/4 pulses in all 4 extremities.  Psychiatry: Mood is appropriate for condition and setting   Data Reviewed: CBC: Recent Labs  Lab 10/23/19 1814 10/24/19 0253  WBC 9.2 8.7  HGB 12.0* 10.3*  HCT 35.2* 29.5*  MCV 105.1* 105.0*  PLT 199 093*   Basic Metabolic Panel: Recent Labs  Lab 10/23/19 1814 10/24/19 0253  NA 133* 135  K 3.9 4.4  CL 93* 99  CO2 26  27  GLUCOSE 145* 132*  BUN 13 13  CREATININE 1.45* 1.33*  CALCIUM 8.8* 8.2*   GFR: Estimated Creatinine Clearance: 52.7 mL/min (A) (by C-G formula based on SCr of 1.33 mg/dL (H)). Liver Function Tests: Recent Labs  Lab 10/23/19 1814 10/24/19 0253  AST 156* 123*  ALT 39 31  ALKPHOS 158* 124  BILITOT 8.2* 6.4*  PROT 7.5 6.1*  ALBUMIN 3.0* 2.5*   No results for input(s): LIPASE, AMYLASE in the last 168 hours. Recent Labs  Lab 10/23/19 2157  AMMONIA 38*   Coagulation Profile: Recent Labs  Lab 10/23/19 2010  INR 1.3*   Cardiac Enzymes: No results for input(s):  CKTOTAL, CKMB, CKMBINDEX, TROPONINI in the last 168 hours. BNP (last 3 results) No results for input(s): PROBNP in the last 8760 hours. HbA1C: No results for input(s): HGBA1C in the last 72 hours. CBG: Recent Labs  Lab 10/23/19 2013  GLUCAP 127*   Lipid Profile: No results for input(s): CHOL, HDL, LDLCALC, TRIG, CHOLHDL, LDLDIRECT in the last 72 hours. Thyroid Function Tests: Recent Labs    10/24/19 0253  TSH 1.612   Anemia Panel: Recent Labs    10/24/19 0610  VITAMINB12 609  FOLATE 6.6  FERRITIN 2,466*  TIBC 116*  IRON 108  RETICCTPCT 2.0   Urine analysis:    Component Value Date/Time   COLORURINE YELLOW 10/23/2019 1814   APPEARANCEUR CLEAR 10/23/2019 1814   LABSPEC 1.010 10/23/2019 1814   PHURINE 7.0 10/23/2019 1814   GLUCOSEU NEGATIVE 10/23/2019 1814   HGBUR NEGATIVE 10/23/2019 1814   BILIRUBINUR NEGATIVE 10/23/2019 1814   KETONESUR NEGATIVE 10/23/2019 1814   PROTEINUR NEGATIVE 10/23/2019 1814   UROBILINOGEN 0.2 08/29/2008 2215   NITRITE NEGATIVE 10/23/2019 1814   LEUKOCYTESUR NEGATIVE 10/23/2019 1814   Sepsis Labs: @LABRCNTIP (procalcitonin:4,lacticidven:4)  ) Recent Results (from the past 240 hour(s))  SARS CORONAVIRUS 2 (TAT 6-24 HRS) Nasopharyngeal Nasopharyngeal Swab     Status: None   Collection Time: 10/23/19  9:57 PM   Specimen: Nasopharyngeal Swab  Result Value Ref Range Status   SARS Coronavirus 2 NEGATIVE NEGATIVE Final    Comment: (NOTE) SARS-CoV-2 target nucleic acids are NOT DETECTED. The SARS-CoV-2 RNA is generally detectable in upper and lower respiratory specimens during the acute phase of infection. Negative results do not preclude SARS-CoV-2 infection, do not rule out co-infections with other pathogens, and should not be used as the sole basis for treatment or other patient management decisions. Negative results must be combined with clinical observations, patient history, and epidemiological information. The expected result is  Negative. Fact Sheet for Patients: SugarRoll.be Fact Sheet for Healthcare Providers: https://www.woods-mathews.com/ This test is not yet approved or cleared by the [place] FDA and  has been authorized for detection and/or diagnosis of SARS-CoV-2 by FDA under an Emergency Use Authorization (EUA). This EUA will remain  in effect (meaning this test can be used) for the duration of the COVID-19 declaration under Section 56 4(b)(1) of the Act, 21 U.S.C. section 360bbb-3(b)(1), unless the authorization is terminated or revoked sooner. Performed at [facility], [place] [address] [place], [place] [address]       Studies: EEG  Result Date: 10/24/2019 Lora Havens, MD     10/24/2019  2:24 PM Patient Name: ABDIFATAH COLQUHOUN MRN: [id number] Epilepsy Attending: Lora Havens Referring Physician/Provider: Dr. Jani Gravel Date: 10/24/2019 Duration: 24.01 minutes Patient history: 74 year old male with history of alcohol abuse who presented after syncopal episode.  EEG to evaluate for seizures.  Level of alertness: Awake AEDs during EEG study: Lorazepam Technical aspects: This EEG study was done with scalp electrodes positioned according to the 10-20 International system of electrode placement. Electrical activity was acquired at a sampling rate of 500Hz  and reviewed with a high frequency filter of 70Hz  and a low frequency filter of 1Hz . EEG data were recorded continuously and digitally stored. Description: The posterior dominant rhythm consists of 8-9 Hz activity of moderate voltage (25-35 uV) seen predominantly in posterior head regions, symmetric and reactive to eye opening and eye closing. Hyperventilation and photic stimulation were not performed. IMPRESSION: This study is within normal limits. No seizures or epileptiform discharges were seen throughout the recording. Lora Havens   CT Head Wo Contrast  Result Date: 10/23/2019 CLINICAL DATA:  Head  trauma after syncopal episode EXAM: CT HEAD WITHOUT CONTRAST TECHNIQUE: Contiguous axial images were obtained from the base of the skull through the vertex without intravenous contrast. COMPARISON:  August 29, 2008 FINDINGS: Brain: No evidence of acute territorial infarction, hemorrhage, hydrocephalus,extra-axial collection or mass lesion/mass effect. There is dilatation the ventricles and sulci consistent with age-related atrophy. Low-attenuation changes in the deep white matter consistent with small vessel ischemia. Vascular: No hyperdense vessel or unexpected calcification. Skull: The skull is intact. No fracture or focal lesion identified. Sinuses/Orbits: The visualized paranasal sinuses and mastoid air cells are clear. The orbits and globes intact. Other: None IMPRESSION: No acute intracranial abnormality. Findings consistent with age related atrophy and chronic small vessel ischemia Electronically Signed   By: Prudencio Pair M.D.   On: 10/23/2019 21:08   CT Angio Chest PE W/Cm &/Or Wo Cm  Result Date: 10/23/2019 CLINICAL DATA:  Follow-up thoracic aneurysm, history of syncopal episode today EXAM: CT ANGIOGRAPHY CHEST WITH CONTRAST TECHNIQUE: Multidetector CT imaging of the chest was performed using the standard protocol during bolus administration of intravenous contrast. Multiplanar CT image reconstructions and MIPs were obtained to evaluate the vascular anatomy. CONTRAST:  167mL OMNIPAQUE IOHEXOL 350 MG/ML SOLN COMPARISON:  05/03/2013 FINDINGS: Cardiovascular: Thoracic aorta again demonstrates atherosclerotic calcifications. Considerable motion artifact somewhat limits evaluation of the ascending aorta. Dilatation is noted similar to that seen on the prior exam primarily at the level of the aortic root and sino-tubular junction. The ascending aorta more distally measures 4 cm in greatest dimension. No evidence of dissection is seen. Normal tapering in the aortic arch and descending aorta are seen. No  cardiac enlargement is seen. The pulmonary artery shows a normal branching pattern although no definitive filling defect to suggest pulmonary embolism is seen. Coronary calcifications are noted. No pericardial effusion is seen. Mediastinum/Nodes: Thoracic inlet is within normal limits. No sizable hilar or mediastinal adenopathy is noted. The esophagus as visualized is within normal limits. Lungs/Pleura: The lungs are well aerated bilaterally. Mild left basilar atelectasis is seen. No focal confluent infiltrate is noted. No sizable effusion or parenchymal nodules are seen. Upper Abdomen: Visualized upper abdomen shows fatty infiltration of the liver. No other focal abnormality is noted. Elevation of the left hemidiaphragm is seen. Musculoskeletal: Degenerative changes of the thoracic spine are noted. No acute bony abnormality is seen. Review of the MIP images confirms the above findings. IMPRESSION: No evidence of pulmonary emboli. Overall stable dilatation of the ascending aorta when compared with the prior exam although some cardiac motion artifact is noted precluding adequate measurements of the aorta. No dissection is seen. Recommend annual imaging followup by CTA or MRA. This recommendation follows 2010 ACCF/AHA/AATS/ACR/ASA/SCA/SCAI/SIR/STS/SVM Guidelines for the Diagnosis and Management  of Patients with Thoracic Aortic Disease. Circulation. 2010; 121: Z169-C789. Aortic aneurysm NOS (ICD10-I71.9) Mild left basilar atelectasis. Fatty infiltration of the liver. Aortic Atherosclerosis (ICD10-I70.0). Aortic aneurysm NOS (ICD10-I71.9). Electronically Signed   By: Inez Catalina M.D.   On: 10/23/2019 21:14   CT Abdomen Pelvis W Contrast  Result Date: 10/23/2019 CLINICAL DATA:  Jaundice and syncopal episode EXAM: CT ABDOMEN AND PELVIS WITH CONTRAST TECHNIQUE: Multidetector CT imaging of the abdomen and pelvis was performed using the standard protocol following bolus administration of intravenous contrast. CONTRAST:   121mL OMNIPAQUE IOHEXOL 350 MG/ML SOLN COMPARISON:  None. FINDINGS: Lower chest: There is mild cardiomegaly. Dense coronary artery calcifications are seen. There is elevation of the left hemidiaphragm. The visualized portions of the lungs are clear. Hepatobiliary: There is diffuse low density seen throughout the liver parenchyma. No focal hepatic lesion is seen.The main portal vein is patent. There is hyperenhancement of the gallbladder wall with a small amount of mesenteric fat stranding changes. No calcified gallstones however are noted. Pancreas: Unremarkable. No pancreatic ductal dilatation or surrounding inflammatory changes. Spleen: Normal in size without focal abnormality. Adrenals/Urinary Tract: Both adrenal glands appear normal. The kidneys and collecting system appear normal without evidence of urinary tract calculus or hydronephrosis. Bladder is unremarkable. Stomach/Bowel: The stomach and small bowel are normal in size and contour. There small foci of air seen adjacent to the anterior liver which are thought to be in decompressed hepatic flexure. The remainder of the colon is unremarkable. Vascular/Lymphatic: There are no enlarged mesenteric, retroperitoneal, or pelvic lymph nodes. Scattered aortic atherosclerotic calcifications are seen without aneurysmal dilatation. Reproductive: The prostate is unremarkable. Other: No evidence of abdominal wall mass or hernia. Musculoskeletal: No acute or significant osseous findings. Degenerative changes are seen throughout the thoracolumbar spine. Endplate sclerosis and cystic changes most notable at L3-L4. IMPRESSION: 1. Hyperenhancement of the gallbladder wall with mild surrounding inflammatory changes, however no definite calcified gallstones are seen. This could be due to early acute acalculous cholecystitis. If further evaluation is required would recommend right upper quadrant ultrasound. 2. Diverticulosis without diverticulitis. 3.  Aortic Atherosclerosis  (ICD10-I70.0). 4. Hepatic steatosis 5. Dense coronary artery calcifications. Electronically Signed   By: Prudencio Pair M.D.   On: 10/23/2019 21:15   ECHOCARDIOGRAM COMPLETE  Result Date: 10/24/2019   ECHOCARDIOGRAM REPORT   Patient Name:   ANGELDEJESUS CALLAHAM Date of Exam: 10/24/2019 Medical Rec #:  [id number]         Height:       71.0 in Accession #:    [id number]        Weight:       166.4 lb Date of Birth:  1946-05-15         BSA:          1.95 m Patient Age:    42 years          BP:           114/78 mmHg Patient Gender: M                 HR:           110 bpm. Exam Location:  Inpatient Procedure: 2D Echo, Cardiac Doppler and Color Doppler Indications:    Syncope  History:        Patient has prior history of Echocardiogram examinations, most                 recent 03/31/2013. COPD; Risk Factors:Hypertension. ETOH abuse,  GI bleed, abnormal liver function.  Sonographer:    Dustin Flock Referring Phys: [place]  1. Left ventricular ejection fraction, by visual estimation, is 50 to 55%. The left ventricle has low normal function. There is no left ventricular hypertrophy.  2. Left ventricular diastolic parameters are indeterminate.  3. The left ventricle demonstrates regional wall motion abnormalities.  4. Abnormal septal motion with inferior basal hypokinesis.  5. Global right ventricle has normal systolic function.The right ventricular size is normal. No increase in right ventricular wall thickness.  6. Left atrial size was normal.  7. Right atrial size was normal.  8. Mild mitral annular calcification.  9. The mitral valve is normal in structure. No evidence of mitral valve regurgitation. No evidence of mitral stenosis. 10. The tricuspid valve is normal in structure. 11. The tricuspid valve is normal in structure. Tricuspid valve regurgitation is not demonstrated. 12. The aortic valve is normal in structure. Aortic valve regurgitation is not visualized. No evidence of aortic  valve sclerosis or stenosis. 13. The pulmonic valve was normal in structure. Pulmonic valve regurgitation is not visualized. 14. Aortic dilatation noted. 15. There is moderate to severe dilatation of the aortic root measuring 47 mm. 16. Consider f/u CTA chest to size aorta. 17. The inferior vena cava is normal in size with greater than 50% respiratory variability, suggesting right atrial pressure of 3 mmHg. 18. Limited apical views obtained and no subcostal views. 19. The interatrial septum was not assessed. FINDINGS  Left Ventricle: Left ventricular ejection fraction, by visual estimation, is 50 to 55%. The left ventricle has low normal function. The left ventricle demonstrates regional wall motion abnormalities. There is no left ventricular hypertrophy. Left ventricular diastolic parameters are indeterminate. Normal left atrial pressure. Abnormal septal motion with inferior basal hypokinesis. Right Ventricle: The right ventricular size is normal. No increase in right ventricular wall thickness. Global RV systolic function is has normal systolic function. Left Atrium: Left atrial size was normal in size. Right Atrium: Right atrial size was normal in size Pericardium: There is no evidence of pericardial effusion. Mitral Valve: The mitral valve is normal in structure. There is mild thickening of the mitral valve leaflet(s). There is mild calcification of the mitral valve leaflet(s). Mild mitral annular calcification. No evidence of mitral valve regurgitation. No evidence of mitral valve stenosis by observation. Tricuspid Valve: The tricuspid valve is normal in structure. Tricuspid valve regurgitation is not demonstrated. Aortic Valve: The aortic valve is normal in structure. Aortic valve regurgitation is not visualized. The aortic valve is structurally normal, with no evidence of sclerosis or stenosis. Pulmonic Valve: The pulmonic valve was normal in structure. Pulmonic valve regurgitation is not visualized. Pulmonic  regurgitation is not visualized. Aorta: The aortic root, ascending aorta and aortic arch are all structurally normal, with no evidence of dilitation or obstruction and aortic dilatation noted. There is moderate to severe dilatation of the aortic root measuring 47 mm. Consider f/u CTA chest to size aorta. Venous: The inferior vena cava is normal in size with greater than 50% respiratory variability, suggesting right atrial pressure of 3 mmHg. IAS/Shunts: The interatrial septum was not assessed. There is no evidence of a patent foramen ovale. No ventricular septal defect is seen or detected. There is no evidence of an atrial septal defect. Additional Comments: Limited apical views obtained and no subcostal views.  Jenkins Rouge MD Electronically signed by Jenkins Rouge MD Signature Date/Time: 10/24/2019/11:40:26 AM    Final    VAS US CAROTID  Result Date: 10/24/2019 Carotid Arterial Duplex Study Indications:       Syncope. Risk Factors:      Hypertension. Comparison Study:  No prior study Performing Technologist: Maudry Mayhew MHA, RDMS, RVT, RDCS  Examination Guidelines: A complete evaluation includes B-mode imaging, spectral Doppler, color Doppler, and power Doppler as needed of all accessible portions of each vessel. Bilateral testing is considered an integral part of a complete examination. Limited examinations for reoccurring indications may be performed as noted.  Right Carotid Findings: +----------+--------+--------+--------+-----------------------+--------+             PSV cm/s EDV cm/s Stenosis Plaque Description      Comments  +----------+--------+--------+--------+-----------------------+--------+  CCA Prox   78       18                smooth and heterogenous           +----------+--------+--------+--------+-----------------------+--------+  CCA Distal 66       20                                                  +----------+--------+--------+--------+-----------------------+--------+  ICA Prox   93        24                calcific                          +----------+--------+--------+--------+-----------------------+--------+  ICA Distal 140      36                                                  +----------+--------+--------+--------+-----------------------+--------+  ECA        120      16                calcific                          +----------+--------+--------+--------+-----------------------+--------+ +----------+--------+-------+----------------+-------------------+             PSV cm/s EDV cms Describe         Arm Pressure (mmHG)  +----------+--------+-------+----------------+-------------------+  Subclavian 58               Multiphasic, WNL                      +----------+--------+-------+----------------+-------------------+ +---------+--------+--+--------+-+---------+  Vertebral PSV cm/s 17 EDV cm/s 6 Antegrade  +---------+--------+--+--------+-+---------+  Left Carotid Findings: +----------+-------+-------+--------+---------------------------------+--------+             PSV     EDV     Stenosis Plaque Description                Comments              cm/s    cm/s                                                         +----------+-------+-------+--------+---------------------------------+--------+  CCA Prox  101     23                                                           +----------+-------+-------+--------+---------------------------------+--------+  CCA Distal 122     30               irregular and heterogenous                  +----------+-------+-------+--------+---------------------------------+--------+  ICA Prox   52      16               irregular, heterogenous and                                                      calcific                                    +----------+-------+-------+--------+---------------------------------+--------+  ICA Distal 70      23               calcific                                     +----------+-------+-------+--------+---------------------------------+--------+  ECA        92      19                                                           +----------+-------+-------+--------+---------------------------------+--------+ +----------+--------+--------+----------------+-------------------+             PSV cm/s EDV cm/s Describe         Arm Pressure (mmHG)  +----------+--------+--------+----------------+-------------------+  Subclavian 124               Multiphasic, WNL                      +----------+--------+--------+----------------+-------------------+ +---------+--------+--+--------+--+---------+  Vertebral PSV cm/s 40 EDV cm/s 13 Antegrade  +---------+--------+--+--------+--+---------+   Summary: Right Carotid: Velocities in the right ICA are consistent with a 1-39% stenosis. Left Carotid: Velocities in the left ICA are consistent with a 1-39% stenosis. Vertebrals:  Bilateral vertebral arteries demonstrate antegrade flow. Subclavians: Normal flow hemodynamics were seen in bilateral subclavian              arteries. *See table(s) above for measurements and observations.  Electronically signed by Antony Contras MD on 10/24/2019 at 12:40:33 PM.    Final     Scheduled Meds:  chlordiazePOXIDE  25 mg Oral Once   [START ON 10/25/2019] feeding supplement (ENSURE ENLIVE)  237 mL Oral BID BM   feeding supplement (PRO-STAT SUGAR FREE 64)  30 mL Oral BID   folic acid  1 mg Oral Daily   LORazepam  0-4 mg Intravenous Q6H   Followed by   Derrill Memo ON 10/26/2019] LORazepam  0-4 mg Intravenous Q12H   losartan  100 mg Oral Daily   multivitamin with minerals  1 tablet Oral Daily   sodium chloride flush  3 mL Intravenous Once   thiamine  100 mg Oral Daily   Or   thiamine  100 mg Intravenous Daily    Continuous Infusions:   LOS: 0 days     Kayleen Memos, MD [facility] Pager [phone]  If 7PM-7AM, please contact night-coverage [email or web address] Password [email or web address] 10/24/2019, 5:06  PM

## 2019-10-24 NOTE — Progress Notes (Signed)
PRELIMINARY RESULTS* Echocardiogram 2D Echocardiogram has been performed.  Matilde Bash 10/24/2019, 11:19 AM

## 2019-10-25 ENCOUNTER — Inpatient Hospital Stay (HOSPITAL_COMMUNITY): Payer: No Typology Code available for payment source

## 2019-10-25 DIAGNOSIS — E440 Moderate protein-calorie malnutrition: Secondary | ICD-10-CM | POA: Insufficient documentation

## 2019-10-25 LAB — CBC
HCT: 28.7 % — ABNORMAL LOW (ref 39.0–52.0)
Hemoglobin: 9.9 g/dL — ABNORMAL LOW (ref 13.0–17.0)
MCH: 36.7 pg — ABNORMAL HIGH (ref 26.0–34.0)
MCHC: 34.5 g/dL (ref 30.0–36.0)
MCV: 106.3 fL — ABNORMAL HIGH (ref 80.0–100.0)
Platelets: 137 10*3/uL — ABNORMAL LOW (ref 150–400)
RBC: 2.7 MIL/uL — ABNORMAL LOW (ref 4.22–5.81)
RDW: 18.7 % — ABNORMAL HIGH (ref 11.5–15.5)
WBC: 8.4 10*3/uL (ref 4.0–10.5)
nRBC: 0 % (ref 0.0–0.2)

## 2019-10-25 LAB — COMPREHENSIVE METABOLIC PANEL
ALT: 31 U/L (ref 0–44)
AST: 116 U/L — ABNORMAL HIGH (ref 15–41)
Albumin: 2.4 g/dL — ABNORMAL LOW (ref 3.5–5.0)
Alkaline Phosphatase: 131 U/L — ABNORMAL HIGH (ref 38–126)
Anion gap: 5 (ref 5–15)
BUN: 17 mg/dL (ref 8–23)
CO2: 30 mmol/L (ref 22–32)
Calcium: 8.6 mg/dL — ABNORMAL LOW (ref 8.9–10.3)
Chloride: 102 mmol/L (ref 98–111)
Creatinine, Ser: 0.93 mg/dL (ref 0.61–1.24)
GFR calc Af Amer: >60 mL/min (ref >60)
GFR calc non Af Amer: >60 mL/min (ref >60)
Glucose, Bld: 113 mg/dL — ABNORMAL HIGH (ref 70–99)
Potassium: 4.2 mmol/L (ref 3.5–5.1)
Sodium: 137 mmol/L (ref 135–145)
Total Bilirubin: 9.4 mg/dL — ABNORMAL HIGH (ref 0.3–1.2)
Total Protein: 6.2 g/dL — ABNORMAL LOW (ref 6.5–8.1)

## 2019-10-25 LAB — PROTIME-INR
INR: 1.4 — ABNORMAL HIGH (ref 0.8–1.2)
Prothrombin Time: 17.2 seconds — ABNORMAL HIGH (ref 11.4–15.2)

## 2019-10-25 LAB — HEPATITIS B SURFACE ANTIBODY, QUANTITATIVE: Hep B S AB Quant (Post): <3.1 m[IU]/mL — ABNORMAL LOW (ref >9.9)

## 2019-10-25 MED ORDER — LACTATED RINGERS IV SOLN: INTRAVENOUS | Status: DC

## 2019-10-25 NOTE — Progress Notes (Signed)
[facility] Progress Note  CC:  Elevated LFTs  Subjective: He reports feeling well today. He though he was having an EGD today. He passed a darker brown stool this morning. No black stool or rectal bleeding. Appetite is fair. No family at bed side.   Objective:  Vital signs in last 24 hours: Temp:  [98.7 F (37.1 C)-98.8 F (37.1 C)] 98.8 F (37.1 C) (02/05 1141) Pulse Rate:  [82-105] 93 (02/05 1141) Resp:  [18] 18 (02/05 1141) BP: (119-143)/(81-98) 143/98 (02/05 1141) SpO2:  [92 %-96 %] 96 % (02/05 1141) Weight:  [73.4 kg] 73.4 kg (02/05 0640) Last BM Date: 10/24/19(smear) General:   Alert 74 year old male sitting up in bed in NAD.  Eyes: Scleral icterus, conjunctiva pink.  Heart: Irregular rhythm, no murmur.  Pulm:  Lungs clear throughout. Abdomen: Soft, nondistended. Nontender. + BS x 4 quads. No HSM. Extremities:  Without edema. Neurologic:  Alert and  oriented x4: Very slight tremors of UE without asterixis.  Psych:  Alert and cooperative. Normal mood and affect.  Intake/Output from previous day: 02/04 0701 - 02/05 0700 In: 120 [P.O.:120] Out: 400 [Urine:400] Intake/Output this shift: No intake/output data recorded.  Lab Results: Recent Labs    10/23/19 1814 10/24/19 0253 10/25/19 0455  WBC 9.2 8.7 8.4  HGB 12.0* 10.3* 9.9*  HCT 35.2* 29.5* 28.7*  PLT 199 149* 137*   BMET Recent Labs    10/23/19 1814 10/24/19 0253 10/25/19 0252  NA 133* 135 137  K 3.9 4.4 4.2  CL 93* 99 102  CO2 26 27 30   GLUCOSE 145* 132* 113*  BUN 13 13 17   CREATININE 1.45* 1.33* 0.93  CALCIUM 8.8* 8.2* 8.6*   LFT Recent Labs    10/24/19 0253 10/24/19 0610 10/25/19 0252  PROT   < >  --  6.2*  ALBUMIN   < >  --  2.4*  AST   < >  --  116*  ALT   < >  --  31  ALKPHOS   < >  --  131*  BILITOT   < >  --  9.4*  BILIDIR  --  3.8*  --    < > = values in this interval not displayed.   PT/INR Recent Labs    10/23/19 2010 10/25/19 0252  LABPROT 16.0*  17.2*  INR 1.3* 1.4*   Hepatitis Panel Recent Labs    10/23/19 2010  HEPBSAG NON REACTIVE  HCVAB NON REACTIVE  HEPAIGM NON REACTIVE  HEPBIGM NON REACTIVE    EEG  Result Date: 10/24/2019 Lora Havens, MD     10/24/2019  2:24 PM Patient Name: Richard Irwin MRN: [id number] Epilepsy Attending: Lora Havens Referring Physician/Provider: Dr. Jani Gravel Date: 10/24/2019 Duration: 24.01 minutes Patient history: 74 year old male with history of alcohol abuse who presented after syncopal episode.  EEG to evaluate for seizures. Level of alertness: Awake AEDs during EEG study: Lorazepam Technical aspects: This EEG study was done with scalp electrodes positioned according to the 10-20 International system of electrode placement. Electrical activity was acquired at a sampling rate of 500Hz  and reviewed with a high frequency filter of 70Hz  and a low frequency filter of 1Hz . EEG data were recorded continuously and digitally stored. Description: The posterior dominant rhythm consists of 8-9 Hz activity of moderate voltage (25-35 uV) seen predominantly in posterior head regions, symmetric and reactive to eye opening and eye closing. Hyperventilation and photic stimulation were not performed. IMPRESSION:  This study is within normal limits. No seizures or epileptiform discharges were seen throughout the recording. Lora Havens   CT Head Wo Contrast  Result Date: 10/23/2019 CLINICAL DATA:  Head trauma after syncopal episode EXAM: CT HEAD WITHOUT CONTRAST TECHNIQUE: Contiguous axial images were obtained from the base of the skull through the vertex without intravenous contrast. COMPARISON:  August 29, 2008 FINDINGS: Brain: No evidence of acute territorial infarction, hemorrhage, hydrocephalus,extra-axial collection or mass lesion/mass effect. There is dilatation the ventricles and sulci consistent with age-related atrophy. Low-attenuation changes in the deep white matter consistent with small vessel  ischemia. Vascular: No hyperdense vessel or unexpected calcification. Skull: The skull is intact. No fracture or focal lesion identified. Sinuses/Orbits: The visualized paranasal sinuses and mastoid air cells are clear. The orbits and globes intact. Other: None IMPRESSION: No acute intracranial abnormality. Findings consistent with age related atrophy and chronic small vessel ischemia Electronically Signed   By: Prudencio Pair M.D.   On: 10/23/2019 21:08   CT Angio Chest PE W/Cm &/Or Wo Cm  Result Date: 10/23/2019 CLINICAL DATA:  Follow-up thoracic aneurysm, history of syncopal episode today EXAM: CT ANGIOGRAPHY CHEST WITH CONTRAST TECHNIQUE: Multidetector CT imaging of the chest was performed using the standard protocol during bolus administration of intravenous contrast. Multiplanar CT image reconstructions and MIPs were obtained to evaluate the vascular anatomy. CONTRAST:  176m OMNIPAQUE IOHEXOL 350 MG/ML SOLN COMPARISON:  05/03/2013 FINDINGS: Cardiovascular: Thoracic aorta again demonstrates atherosclerotic calcifications. Considerable motion artifact somewhat limits evaluation of the ascending aorta. Dilatation is noted similar to that seen on the prior exam primarily at the level of the aortic root and sino-tubular junction. The ascending aorta more distally measures 4 cm in greatest dimension. No evidence of dissection is seen. Normal tapering in the aortic arch and descending aorta are seen. No cardiac enlargement is seen. The pulmonary artery shows a normal branching pattern although no definitive filling defect to suggest pulmonary embolism is seen. Coronary calcifications are noted. No pericardial effusion is seen. Mediastinum/Nodes: Thoracic inlet is within normal limits. No sizable hilar or mediastinal adenopathy is noted. The esophagus as visualized is within normal limits. Lungs/Pleura: The lungs are well aerated bilaterally. Mild left basilar atelectasis is seen. No focal confluent infiltrate is  noted. No sizable effusion or parenchymal nodules are seen. Upper Abdomen: Visualized upper abdomen shows fatty infiltration of the liver. No other focal abnormality is noted. Elevation of the left hemidiaphragm is seen. Musculoskeletal: Degenerative changes of the thoracic spine are noted. No acute bony abnormality is seen. Review of the MIP images confirms the above findings. IMPRESSION: No evidence of pulmonary emboli. Overall stable dilatation of the ascending aorta when compared with the prior exam although some cardiac motion artifact is noted precluding adequate measurements of the aorta. No dissection is seen. Recommend annual imaging followup by CTA or MRA. This recommendation follows 2010 ACCF/AHA/AATS/ACR/ASA/SCA/SCAI/SIR/STS/SVM Guidelines for the Diagnosis and Management of Patients with Thoracic Aortic Disease. Circulation. 2010; 121:: Q982-M415 Aortic aneurysm NOS (ICD10-I71.9) Mild left basilar atelectasis. Fatty infiltration of the liver. Aortic Atherosclerosis (ICD10-I70.0). Aortic aneurysm NOS (ICD10-I71.9). Electronically Signed   By: MInez CatalinaM.D.   On: 10/23/2019 21:14   CT Abdomen Pelvis W Contrast  Result Date: 10/23/2019 CLINICAL DATA:  Jaundice and syncopal episode EXAM: CT ABDOMEN AND PELVIS WITH CONTRAST TECHNIQUE: Multidetector CT imaging of the abdomen and pelvis was performed using the standard protocol following bolus administration of intravenous contrast. CONTRAST:  1050mOMNIPAQUE IOHEXOL 350 MG/ML SOLN COMPARISON:  None.  FINDINGS: Lower chest: There is mild cardiomegaly. Dense coronary artery calcifications are seen. There is elevation of the left hemidiaphragm. The visualized portions of the lungs are clear. Hepatobiliary: There is diffuse low density seen throughout the liver parenchyma. No focal hepatic lesion is seen.The main portal vein is patent. There is hyperenhancement of the gallbladder wall with a small amount of mesenteric fat stranding changes. No calcified  gallstones however are noted. Pancreas: Unremarkable. No pancreatic ductal dilatation or surrounding inflammatory changes. Spleen: Normal in size without focal abnormality. Adrenals/Urinary Tract: Both adrenal glands appear normal. The kidneys and collecting system appear normal without evidence of urinary tract calculus or hydronephrosis. Bladder is unremarkable. Stomach/Bowel: The stomach and small bowel are normal in size and contour. There small foci of air seen adjacent to the anterior liver which are thought to be in decompressed hepatic flexure. The remainder of the colon is unremarkable. Vascular/Lymphatic: There are no enlarged mesenteric, retroperitoneal, or pelvic lymph nodes. Scattered aortic atherosclerotic calcifications are seen without aneurysmal dilatation. Reproductive: The prostate is unremarkable. Other: No evidence of abdominal wall mass or hernia. Musculoskeletal: No acute or significant osseous findings. Degenerative changes are seen throughout the thoracolumbar spine. Endplate sclerosis and cystic changes most notable at L3-L4. IMPRESSION: 1. Hyperenhancement of the gallbladder wall with mild surrounding inflammatory changes, however no definite calcified gallstones are seen. This could be due to early acute acalculous cholecystitis. If further evaluation is required would recommend right upper quadrant ultrasound. 2. Diverticulosis without diverticulitis. 3.  Aortic Atherosclerosis (ICD10-I70.0). 4. Hepatic steatosis 5. Dense coronary artery calcifications. Electronically Signed   By: Prudencio Pair M.D.   On: 10/23/2019 21:15   ECHOCARDIOGRAM COMPLETE  Result Date: 10/24/2019   ECHOCARDIOGRAM REPORT   Patient Name:   MERTON WADLOW Date of Exam: 10/24/2019 Medical Rec #:  [id number]         Height:       71.0 in Accession #:    [id number]        Weight:       166.4 lb Date of Birth:  Dec 01, 1945         BSA:          1.95 m Patient Age:    59 years          BP:           114/78 mmHg Patient  Gender: M                 HR:           110 bpm. Exam Location:  Inpatient Procedure: 2D Echo, Cardiac Doppler and Color Doppler Indications:    Syncope  History:        Patient has prior history of Echocardiogram examinations, most                 recent 03/31/2013. COPD; Risk Factors:Hypertension. ETOH abuse,                 GI bleed, abnormal liver function.  Sonographer:    Dustin Flock Referring Phys: [place]  1. Left ventricular ejection fraction, by visual estimation, is 50 to 55%. The left ventricle has low normal function. There is no left ventricular hypertrophy.  2. Left ventricular diastolic parameters are indeterminate.  3. The left ventricle demonstrates regional wall motion abnormalities.  4. Abnormal septal motion with inferior basal hypokinesis.  5. Global right ventricle has normal systolic function.The right ventricular size is normal. No increase in right  ventricular wall thickness.  6. Left atrial size was normal.  7. Right atrial size was normal.  8. Mild mitral annular calcification.  9. The mitral valve is normal in structure. No evidence of mitral valve regurgitation. No evidence of mitral stenosis. 10. The tricuspid valve is normal in structure. 11. The tricuspid valve is normal in structure. Tricuspid valve regurgitation is not demonstrated. 12. The aortic valve is normal in structure. Aortic valve regurgitation is not visualized. No evidence of aortic valve sclerosis or stenosis. 13. The pulmonic valve was normal in structure. Pulmonic valve regurgitation is not visualized. 14. Aortic dilatation noted. 15. There is moderate to severe dilatation of the aortic root measuring 47 mm. 16. Consider f/u CTA chest to size aorta. 17. The inferior vena cava is normal in size with greater than 50% respiratory variability, suggesting right atrial pressure of 3 mmHg. 18. Limited apical views obtained and no subcostal views. 19. The interatrial septum was not assessed. FINDINGS   Left Ventricle: Left ventricular ejection fraction, by visual estimation, is 50 to 55%. The left ventricle has low normal function. The left ventricle demonstrates regional wall motion abnormalities. There is no left ventricular hypertrophy. Left ventricular diastolic parameters are indeterminate. Normal left atrial pressure. Abnormal septal motion with inferior basal hypokinesis. Right Ventricle: The right ventricular size is normal. No increase in right ventricular wall thickness. Global RV systolic function is has normal systolic function. Left Atrium: Left atrial size was normal in size. Right Atrium: Right atrial size was normal in size Pericardium: There is no evidence of pericardial effusion. Mitral Valve: The mitral valve is normal in structure. There is mild thickening of the mitral valve leaflet(s). There is mild calcification of the mitral valve leaflet(s). Mild mitral annular calcification. No evidence of mitral valve regurgitation. No evidence of mitral valve stenosis by observation. Tricuspid Valve: The tricuspid valve is normal in structure. Tricuspid valve regurgitation is not demonstrated. Aortic Valve: The aortic valve is normal in structure. Aortic valve regurgitation is not visualized. The aortic valve is structurally normal, with no evidence of sclerosis or stenosis. Pulmonic Valve: The pulmonic valve was normal in structure. Pulmonic valve regurgitation is not visualized. Pulmonic regurgitation is not visualized. Aorta: The aortic root, ascending aorta and aortic arch are all structurally normal, with no evidence of dilitation or obstruction and aortic dilatation noted. There is moderate to severe dilatation of the aortic root measuring 47 mm. Consider f/u CTA chest to size aorta. Venous: The inferior vena cava is normal in size with greater than 50% respiratory variability, suggesting right atrial pressure of 3 mmHg. IAS/Shunts: The interatrial septum was not assessed. There is no evidence of  a patent foramen ovale. No ventricular septal defect is seen or detected. There is no evidence of an atrial septal defect. Additional Comments: Limited apical views obtained and no subcostal views.  Jenkins Rouge MD Electronically signed by Jenkins Rouge MD Signature Date/Time: 10/24/2019/11:40:26 AM    Final    VAS US CAROTID  Result Date: 10/24/2019 Carotid Arterial Duplex Study Indications:       Syncope. Risk Factors:      Hypertension. Comparison Study:  No prior study Performing Technologist: Maudry Mayhew MHA, RDMS, RVT, RDCS  Examination Guidelines: A complete evaluation includes B-mode imaging, spectral Doppler, color Doppler, and power Doppler as needed of all accessible portions of each vessel. Bilateral testing is considered an integral part of a complete examination. Limited examinations for reoccurring indications may be performed as noted.  Right Carotid Findings: +----------+--------+--------+--------+-----------------------+--------+  PSV cm/sEDV cm/sStenosisPlaque Description     Comments +----------+--------+--------+--------+-----------------------+--------+ CCA Prox  78      18              smooth and heterogenous         +----------+--------+--------+--------+-----------------------+--------+ CCA Distal66      20                                              +----------+--------+--------+--------+-----------------------+--------+ ICA Prox  93      24              calcific                        +----------+--------+--------+--------+-----------------------+--------+ ICA Distal140     36                                              +----------+--------+--------+--------+-----------------------+--------+ ECA       120     16              calcific                        +----------+--------+--------+--------+-----------------------+--------+ +----------+--------+-------+----------------+-------------------+           PSV cm/sEDV cmsDescribe         Arm Pressure (mmHG) +----------+--------+-------+----------------+-------------------+ [id number]             Multiphasic, WNL                    +----------+--------+-------+----------------+-------------------+ +---------+--------+--+--------+-+---------+ VertebralPSV cm/s17EDV cm/s6Antegrade +---------+--------+--+--------+-+---------+  Left Carotid Findings: +----------+-------+-------+--------+---------------------------------+--------+           PSV    EDV    StenosisPlaque Description               Comments           cm/s   cm/s                                                     +----------+-------+-------+--------+---------------------------------+--------+ CCA Prox  101    23                                                       +----------+-------+-------+--------+---------------------------------+--------+ CCA Distal122    30             irregular and heterogenous                +----------+-------+-------+--------+---------------------------------+--------+ ICA Prox  52     16             irregular, heterogenous and                                               calcific                                  +----------+-------+-------+--------+---------------------------------+--------+  ICA Distal70     23             calcific                                  +----------+-------+-------+--------+---------------------------------+--------+ ECA       92     19                                                       +----------+-------+-------+--------+---------------------------------+--------+ +----------+--------+--------+----------------+-------------------+           PSV cm/sEDV cm/sDescribe        Arm Pressure (mmHG) +----------+--------+--------+----------------+-------------------+ [id number]             Multiphasic, WNL                    +----------+--------+--------+----------------+-------------------+  +---------+--------+--+--------+--+---------+ VertebralPSV cm/s40EDV cm/s13Antegrade +---------+--------+--+--------+--+---------+   Summary: Right Carotid: Velocities in the right ICA are consistent with a 1-39% stenosis. Left Carotid: Velocities in the left ICA are consistent with a 1-39% stenosis. Vertebrals:  Bilateral vertebral arteries demonstrate antegrade flow. Subclavians: Normal flow hemodynamics were seen in bilateral subclavian              arteries. *See table(s) above for measurements and observations.  Electronically signed by Antony Contras MD on 10/24/2019 at 12:40:33 PM.    Final    US Abdomen Limited RUQ  Result Date: 10/25/2019 CLINICAL DATA:  Acalculous cholecystitis EXAM: ULTRASOUND ABDOMEN LIMITED RIGHT UPPER QUADRANT COMPARISON:  None FINDINGS: Gallbladder: Mild thickening of gallbladder wall. No gallstones, pericholecystic fluid or sonographic Murphy sign. Common bile duct: Diameter: 5 mm diameter, normal Liver: Echogenic parenchyma, likely fatty infiltration though this can be seen with cirrhosis and certain infiltrative disorders. No focal hepatic mass or nodularity are identified, though assessment of intrahepatic detail is suboptimal due to sound attenuation. Portal vein is patent on color Doppler imaging with normal direction of blood flow towards the liver. Other: No RIGHT upper quadrant free fluid. IMPRESSION: Mild gallbladder wall thickening without additional gallbladder findings, nonspecific. If there is persistent clinical concern for acalculous cholecystitis, consider follow-up radionuclide hepatobiliary imaging. Probable fatty infiltration of liver as above. Electronically Signed   By: Lavonia Dana M.D.   On: 10/25/2019 08:28    Assessment / Plan:  22.  74 year old male with a history of alcohol abuse with elevated LFTs.  AST/ALT ratio consistent with EtOH hepatitis.  CT abdomen pelvis 2/3 identified a fatty liver, gallbladder wall thickening without evidence of stones  or acute cholecystitis. Increasing discriminant function  27.3 >> 17, he does not yet meet criteria for Prednisolone. Today Total bili up 9.4 from 6.4.  Alk phos 131 >> 124.   AST 116 >> 123. ALT 31.  -Repeat Hepatic panel and INR in am -Alcohol abstinence discussed -Monitor for alcohol withdrawal.  -Further recommendations per Dr. Rush Landmark  2. Syncope of unclear etiology. Noncritical bilateral ICA stenosis per Dopplers. He remains hemodynamically stable.  Hear rhythm irregular on exam today. -Consider cardiac evaluation as inpatient, defer to the hospitalist service.   3. Mild coagulopathy.  INR 1.4 >> 1.3.  4.  Chronic macrocytic anemia. Hg drifting downward.  Today Hemoglobin 9.9 >> 10.3.   Hematocrit 28.7.  MCV 106.3.  Iron 108.  TIBC 116.  Ferritin 2466.  B12 609. No obvious signs of GI bleeding.  -Recommend EGD to be done at the time of his colonoscopy, be scheduled at his office appointment with  Dr. Fuller Plan on 11/28/2019.  He will require an earlier procedure date if he demonstrates evidence of active GI bleeding.  5. Thrombocytopenia.  Platelet count 137.  6.  History of adenomatous colon polyps in 2010 and 2015.  Past due for surveillance colonoscopy.  Patient has appointment with Dr. Fuller Plan on 11/28/2019 to schedule a colonoscopy.  Recommend EGD to be done at the time of his colonoscopy.  7.  Questionable underlying depression -Recommend social work consult prior to discharge  Principal Problem:   Syncope Active Problems:   Essential hypertension   Abnormal liver function   Alcohol abuse   Hyponatremia   Protein-calorie malnutrition, severe (HCC)   Malnutrition of moderate degree     LOS: 1 day   Noralyn Pick  10/25/2019, 1:04 PM

## 2019-10-25 NOTE — Progress Notes (Signed)
Updated the patient's spouse, Narda Rutherford, via phone.  Informed her that his T. bilirubin is trending up to 9 and there is no plan for dc today.  All questions answered.  Will continue to update throughout the course of his hospitalization.

## 2019-10-25 NOTE — Progress Notes (Signed)
PROGRESS NOTE  Richard Irwin NFA:[id number] DOB: [birth year] DOA: 10/23/2019 PCP: Lawerance Cruel, MD  HPI/Recap of past 24 hours: Richard Irwin  is a 74 y.o. male, w hypertension, Etoh abuse,  anemia, diverticulosis, h/o GI bleeding,  Copd, was at the bar standing at home when had syncope, per wife his eyes rolled back. No tongue laceration, no incontinence. No tonic clonic movement.  Pt states lasted a few sec.  Ended up on floor,  Wife called EMS.    10/25/19: Seen and examined.  He has no new complaints and wants to go home.  Worsening transaminitis.  T bili trending up to 9.4 from 6.4.  Ongoing evaluation.  .  Assessment/Plan: Principal Problem:   Syncope Active Problems:   Essential hypertension   Abnormal liver function   Alcohol abuse   Hyponatremia   Protein-calorie malnutrition, severe (HCC)   Malnutrition of moderate degree   Syncope, possibly cardiac etiology Work-up unrevealing thus far.  Would benefit from Holter monitor or loop recorder. 2D echo no LVH.  Abnormal septal motion with inferior basal hypokinesis.  LVEF 50 to 55%.  There was moderate to severe dilatation of the aortic root measuring 4.7 cm.  Consider follow-up CTA chest to size Orthostatic vital signs pending EEG unremarkable No evidence of significant carotid stenosis on bilateral Doppler ultrasound MRI brain, patient declines Continue fall precautions OT recommended home health OT and 24 hours assistance and supervision.  Worsening hyperbilirubinemia  No evidence of gallstones on right upper quadrant ultrasound T. Bilirubin 3.8 trending up 9.4 Repeat CMP in the morning  Acute drop in Hg/plt suspect bone marrow suppression in the setting of chronic alcohol use Denies melena or hematochezia Hg drop 9.9 from 12 Obtain FOBT  History of adenomatous colon polyps in 2010 in 2015 Colonoscopy planned with GI [place] Dr. Fuller Plan outpatient on 11/28/2019.   Elevated AST/alkaline phosphatase in the  setting of alcohol abuse Continue to monitor  Alcohol abuse disorder Elevated alcohol level on presentation CIWA protocol Alcohol cessation counseling done at bedside  Essential hypertension Blood pressure stable Continue losartan  ?Depression/chronic alcohol abuse TOC consulted to provide resources.  Ambulatory dysfunction OT recommended home health OT Fall precautions   Code Status: Full code  Family Communication: None at bedside  Disposition Plan: Patient is from home.  Anticipate discharge to home once GI signs off.  Barrier to discharge ongoing work-up.   Consultants:  GI  Procedures:  EEG  Antimicrobials:  None  DVT prophylaxis: SCDs   Objective: Vitals:   10/25/19 0600 10/25/19 0640 10/25/19 1059 10/25/19 1141  BP:  119/81 (!) 128/93 (!) 143/98  Pulse: 85 82 86 93  Resp:  18  18  Temp:  98.8 F (37.1 C)  98.8 F (37.1 C)  TempSrc:  Oral  Oral  SpO2:  93%  96%  Weight:  73.4 kg    Height:        Intake/Output Summary (Last 24 hours) at 10/25/2019 1710 Last data filed at 10/25/2019 1519 Gross per 24 hour  Intake 663.33 ml  Output --  Net 663.33 ml   Filed Weights   10/23/19 1808 10/24/19 0500 10/25/19 0640  Weight: 74.8 kg 75.5 kg 73.4 kg    Exam:  . General: 74 y.o. year-old male well-developed well-nourished no acute stress.  Alert oriented x3.   . Cardiovascular: Regular rate and rhythm no rubs or gallops.   Marland Kitchen Respiratory: Clear to auscultation no wheezes no rales.   . Abdomen: Soft nontender  normal bowel sounds present  . musculoskeletal: No lower extremity edema bilaterally. Psychiatry: Mood is appropriate for condition and setting.  Data Reviewed: CBC: Recent Labs  Lab 10/23/19 1814 10/24/19 0253 10/25/19 0455  WBC 9.2 8.7 8.4  HGB 12.0* 10.3* 9.9*  HCT 35.2* 29.5* 28.7*  MCV 105.1* 105.0* 106.3*  PLT 199 149* 578*   Basic Metabolic Panel: Recent Labs  Lab 10/23/19 1814 10/24/19 0253 10/25/19 0252  NA 133* 135  137  K 3.9 4.4 4.2  CL 93* 99 102  CO2 26 27 30   GLUCOSE 145* 132* 113*  BUN 13 13 17   CREATININE 1.45* 1.33* 0.93  CALCIUM 8.8* 8.2* 8.6*   GFR: Estimated Creatinine Clearance: 73.4 mL/min (by C-G formula based on SCr of 0.93 mg/dL). Liver Function Tests: Recent Labs  Lab 10/23/19 1814 10/24/19 0253 10/25/19 0252  AST 156* 123* 116*  ALT 39 31 31  ALKPHOS 158* 124 131*  BILITOT 8.2* 6.4* 9.4*  PROT 7.5 6.1* 6.2*  ALBUMIN 3.0* 2.5* 2.4*   No results for input(s): LIPASE, AMYLASE in the last 168 hours. Recent Labs  Lab 10/23/19 2157  AMMONIA 38*   Coagulation Profile: Recent Labs  Lab 10/23/19 2010 10/25/19 0252  INR 1.3* 1.4*   Cardiac Enzymes: No results for input(s): CKTOTAL, CKMB, CKMBINDEX, TROPONINI in the last 168 hours. BNP (last 3 results) No results for input(s): PROBNP in the last 8760 hours. HbA1C: No results for input(s): HGBA1C in the last 72 hours. CBG: Recent Labs  Lab 10/23/19 2013  GLUCAP 127*   Lipid Profile: No results for input(s): CHOL, HDL, LDLCALC, TRIG, CHOLHDL, LDLDIRECT in the last 72 hours. Thyroid Function Tests: Recent Labs    10/24/19 0253  TSH 1.612   Anemia Panel: Recent Labs    10/24/19 0610  VITAMINB12 609  FOLATE 6.6  FERRITIN 2,466*  TIBC 116*  IRON 108  RETICCTPCT 2.0   Urine analysis:    Component Value Date/Time   COLORURINE YELLOW 10/23/2019 1814   APPEARANCEUR CLEAR 10/23/2019 1814   LABSPEC 1.010 10/23/2019 1814   PHURINE 7.0 10/23/2019 1814   GLUCOSEU NEGATIVE 10/23/2019 1814   HGBUR NEGATIVE 10/23/2019 1814   BILIRUBINUR NEGATIVE 10/23/2019 1814   KETONESUR NEGATIVE 10/23/2019 1814   PROTEINUR NEGATIVE 10/23/2019 1814   UROBILINOGEN 0.2 08/29/2008 2215   NITRITE NEGATIVE 10/23/2019 1814   LEUKOCYTESUR NEGATIVE 10/23/2019 1814   Sepsis Labs: @LABRCNTIP (procalcitonin:4,lacticidven:4)  ) Recent Results (from the past 240 hour(s))  SARS CORONAVIRUS 2 (TAT 6-24 HRS) Nasopharyngeal  Nasopharyngeal Swab     Status: None   Collection Time: 10/23/19  9:57 PM   Specimen: Nasopharyngeal Swab  Result Value Ref Range Status   SARS Coronavirus 2 NEGATIVE NEGATIVE Final    Comment: (NOTE) SARS-CoV-2 target nucleic acids are NOT DETECTED. The SARS-CoV-2 RNA is generally detectable in upper and lower respiratory specimens during the acute phase of infection. Negative results do not preclude SARS-CoV-2 infection, do not rule out co-infections with other pathogens, and should not be used as the sole basis for treatment or other patient management decisions. Negative results must be combined with clinical observations, patient history, and epidemiological information. The expected result is Negative. Fact Sheet for Patients: SugarRoll.be Fact Sheet for Healthcare Providers: https://www.woods-mathews.com/ This test is not yet approved or cleared by the [place] FDA and  has been authorized for detection and/or diagnosis of SARS-CoV-2 by FDA under an Emergency Use Authorization (EUA). This EUA will remain  in effect (meaning this test can be  used) for the duration of the COVID-19 declaration under Section 56 4(b)(1) of the Act, 21 U.S.C. section 360bbb-3(b)(1), unless the authorization is terminated or revoked sooner. Performed at [facility], [place] [address], [place], [place] [address]       Studies: US Abdomen Limited RUQ  Result Date: 10/25/2019 CLINICAL DATA:  Acalculous cholecystitis EXAM: ULTRASOUND ABDOMEN LIMITED RIGHT UPPER QUADRANT COMPARISON:  None FINDINGS: Gallbladder: Mild thickening of gallbladder wall. No gallstones, pericholecystic fluid or sonographic Murphy sign. Common bile duct: Diameter: 5 mm diameter, normal Liver: Echogenic parenchyma, likely fatty infiltration though this can be seen with cirrhosis and certain infiltrative disorders. No focal hepatic mass or nodularity are identified, though assessment  of intrahepatic detail is suboptimal due to sound attenuation. Portal vein is patent on color Doppler imaging with normal direction of blood flow towards the liver. Other: No RIGHT upper quadrant free fluid. IMPRESSION: Mild gallbladder wall thickening without additional gallbladder findings, nonspecific. If there is persistent clinical concern for acalculous cholecystitis, consider follow-up radionuclide hepatobiliary imaging. Probable fatty infiltration of liver as above. Electronically Signed   By: Lavonia Dana M.D.   On: 10/25/2019 08:28    Scheduled Meds: . feeding supplement (ENSURE ENLIVE)  237 mL Oral BID BM  . feeding supplement (PRO-STAT SUGAR FREE 64)  30 mL Oral BID  . folic acid  1 mg Oral Daily  . LORazepam  0-4 mg Intravenous Q6H   Followed by  . [START ON 10/26/2019] LORazepam  0-4 mg Intravenous Q12H  . losartan  100 mg Oral Daily  . multivitamin with minerals  1 tablet Oral Daily  . sodium chloride flush  3 mL Intravenous Once  . thiamine  100 mg Oral Daily   Or  . thiamine  100 mg Intravenous Daily    Continuous Infusions: . lactated ringers 50 mL/hr at 10/25/19 0651     LOS: 1 day     Kayleen Memos, MD [facility] Pager [phone]  If 7PM-7AM, please contact night-coverage [email or web address] Password [email or web address] 10/25/2019, 5:10 PM

## 2019-10-25 NOTE — Evaluation (Signed)
Physical Therapy Evaluation Patient Details Name: Richard Irwin MRN: [id number] DOB: [birth year] Today's Date: 10/25/2019   History of Present Illness  Pt is 74 yo  male with PMH including HTN, ETOH abuse, anemia, diverticulosis, and h/o GI bleeding, and COPD.  Pt was standing at home and had episode of syncope.  Admitted with syncope with ongoing workup.  Clinical Impression  Pt admitted with above diagnosis. Pt presenting with mild instability in standing and walking.  He did have orthostatic hypotension with standing but denied symptoms.  Pt currently with functional limitations due to the deficits listed below (see PT Problem List). Pt will benefit from skilled PT to increase their independence and safety with mobility to allow discharge to the venue listed below.       Follow Up Recommendations No PT follow up    Equipment Recommendations  None recommended by PT    Recommendations for Other Services       Precautions / Restrictions Precautions Precautions: Fall Restrictions Weight Bearing Restrictions: No      Mobility  Bed Mobility Overal bed mobility: Needs Assistance Bed Mobility: Supine to Sit;Sit to Supine     Supine to sit: Supervision Sit to supine: Supervision      Transfers Overall transfer level: Needs assistance Equipment used: Rolling walker (2 wheeled) Transfers: Sit to/from Stand Sit to Stand: Min guard         General transfer comment: cues for proper hand placement  Ambulation/Gait Ambulation/Gait assistance: Min guard Gait Distance (Feet): 150 Feet Assistive device: Rolling walker (2 wheeled);None Gait Pattern/deviations: Step-through pattern     General Gait Details: Ambulated 100' with RW with cues for RW placement; ambulated 59' without AD - pt with mild unsteadiness  Stairs            Wheelchair Mobility    Modified Rankin (Stroke Patients Only)       Balance Overall balance assessment: Needs  assistance Sitting-balance support: No upper extremity supported;Feet supported Sitting balance-Leahy Scale: Good     Standing balance support: Single extremity supported;During functional activity Standing balance-Leahy Scale: Fair Standing balance comment: reliant on B UE support                             Pertinent Vitals/Pain Pain Assessment: No/denies pain    Home Living Family/patient expects to be discharged to:: Private residence Living Arrangements: Spouse/significant other Available Help at Discharge: Family;Available 24 hours/day Type of Home: House Home Access: Stairs to enter Entrance Stairs-Rails: Psychiatric nurse of Steps: 5 Home Layout: Two level;Bed/bath upstairs Home Equipment: None      Prior Function Level of Independence: Independent               Hand Dominance   Dominant Hand: Right    Extremity/Trunk Assessment   Upper Extremity Assessment Upper Extremity Assessment: Defer to OT evaluation    Lower Extremity Assessment Lower Extremity Assessment: Overall WFL for tasks assessed(ROM and MMT 5/5 throughout)    Cervical / Trunk Assessment Cervical / Trunk Assessment: Normal  Communication   Communication: HOH  Cognition Arousal/Alertness: Awake/alert Behavior During Therapy: WFL for tasks assessed/performed Overall Cognitive Status: Within Functional Limits for tasks assessed Area of Impairment: Safety/judgement;Problem solving                         Safety/Judgement: Decreased awareness of safety   Problem Solving: Difficulty sequencing;Requires verbal cues General Comments: requires  multiple cues not to sit until OT have IV tubing out of patient's way however patient did not follow, required mod cuing to problem solve to untangle IV tube      General Comments General comments (skin integrity, edema, etc.):  BP supine 130/80 and HR 89;  BP Sitting 126/84 and HR 101;  BP standing 99/77 and  HR 112;  BP standing/walking 3 mins 101/74 HR 127;  O2 sats stable    Exercises     Assessment/Plan    PT Assessment Patient needs continued PT services  PT Problem List Decreased mobility;Decreased safety awareness;Decreased coordination;Decreased balance;Decreased knowledge of use of DME;Decreased activity tolerance;Cardiopulmonary status limiting activity       PT Treatment Interventions DME instruction;Therapeutic activities;Gait training;Patient/family education;Therapeutic exercise;Stair training;Balance training;Functional mobility training;Neuromuscular re-education    PT Goals (Current goals can be found in the Care Plan section)  Acute Rehab PT Goals Patient Stated Goal: go home PT Goal Formulation: With patient Time For Goal Achievement: 11/08/19 Potential to Achieve Goals: Good    Frequency Min 3X/week   Barriers to discharge        Co-evaluation               AM-PAC PT "6 Clicks" Mobility  Outcome Measure Help needed turning from your back to your side while in a flat bed without using bedrails?: None Help needed moving from lying on your back to sitting on the side of a flat bed without using bedrails?: None Help needed moving to and from a bed to a chair (including a wheelchair)?: None Help needed standing up from a chair using your arms (e.g., wheelchair or bedside chair)?: None Help needed to walk in hospital room?: None Help needed climbing 3-5 steps with a railing? : A Little 6 Click Score: 23    End of Session Equipment Utilized During Treatment: Gait belt Activity Tolerance: Patient tolerated treatment well Patient left: in bed;with call bell/phone within reach;with bed alarm set Nurse Communication: Mobility status(orthostatic hypotension) PT Visit Diagnosis: Unsteadiness on feet (R26.81)    Time: 4431-5400 PT Time Calculation (min) (ACUTE ONLY): 20 min   Charges:   PT Evaluation $PT Eval Low Complexity: 1 Low          Maggie Font, PT Acute Rehab Services Pager [phone] [facility] [phone] [facility] [phone]   Karlton Lemon 10/25/2019, 1:36 PM

## 2019-10-25 NOTE — Evaluation (Signed)
Occupational Therapy Evaluation Patient Details Name: Richard Irwin MRN: [id number] DOB: Oct 29, 1945 Today's Date: 10/25/2019    History of Present Illness 74 y.o. male, w hypertension, Etoh abuse,  anemia, diverticulosis, h/o GI bleeding,  Copd, was at the bar standing at home when had syncope. Patient admitted for syncope and abnormal liver function/ jaundice.   Clinical Impression   Patient is a 74 year old male that lives with his spouse in a two story home with full flight of stairs to bedroom/bathroom. Patient reports he is fully independent at baseline, does not use AD. Currently patient is mildly unsteady with functional mobility and ambulation using a rolling walker with min guard assist for safety. Min guard standing at sink for grooming/hygiene. Recommend continued acute OT services to maximize patient independence and safety with self care.     Follow Up Recommendations  Home health OT;Supervision/Assistance - 24 hour    Equipment Recommendations  Other (comment)(rolling walker)       Precautions / Restrictions Precautions Precautions: Fall Restrictions Weight Bearing Restrictions: No      Mobility Bed Mobility Overal bed mobility: Needs Assistance Bed Mobility: Supine to Sit;Sit to Supine     Supine to sit: Supervision Sit to supine: Supervision      Transfers Overall transfer level: Needs assistance Equipment used: Rolling walker (2 wheeled) Transfers: Sit to/from Stand Sit to Stand: Min guard         General transfer comment: cues for proper hand placement    Balance Overall balance assessment: Needs assistance Sitting-balance support: No upper extremity supported;Feet supported Sitting balance-Leahy Scale: Good     Standing balance support: Bilateral upper extremity supported;During functional activity Standing balance-Leahy Scale: Poor Standing balance comment: reliant on B UE support                           ADL either  performed or assessed with clinical judgement   ADL Overall ADL's : Needs assistance/impaired Eating/Feeding: Independent;Sitting   Grooming: Min guard;Wash/dry face;Wash/dry hands;Oral care;Standing   Upper Body Bathing: Set up;Sitting   Lower Body Bathing: Min guard;Sitting/lateral leans;Sit to/from stand   Upper Body Dressing : Set up;Sitting   Lower Body Dressing: Set up;Min guard;Sitting/lateral leans;Sit to/from stand Lower Body Dressing Details (indicate cue type and reason): seated at edge of bed patient able to doff/don socks. min guard in standing for safety with balance Toilet Transfer: Min guard;RW;Regular Toilet;Cueing for Office manager Details (indicate cue type and reason): simulated with functional mobility, min guard with min cues for safety awareness Toileting- Clothing Manipulation and Hygiene: Min guard;Sitting/lateral lean;Sit to/from stand       Functional mobility during ADLs: Min guard;Rolling walker;Cueing for safety                    Pertinent Vitals/Pain Pain Assessment: No/denies pain     Hand Dominance Right   Extremity/Trunk Assessment Upper Extremity Assessment Upper Extremity Assessment: Overall WFL for tasks assessed   Lower Extremity Assessment Lower Extremity Assessment: Defer to PT evaluation       Communication Communication Communication: HOH   Cognition Arousal/Alertness: Awake/alert Behavior During Therapy: WFL for tasks assessed/performed Overall Cognitive Status: No family/caregiver present to determine baseline cognitive functioning Area of Impairment: Safety/judgement;Problem solving                         Safety/Judgement: Decreased awareness of safety   Problem Solving: Difficulty sequencing;Requires verbal  cues General Comments: requires multiple cues not to sit until OT have IV tubing out of patient's way however patient did not follow, required mod cuing to problem solve to untangle IV  tube   General Comments  BP supine 134/82, BP sitting 135/85. patient denied any dizziness throughout session            [place] expects to be discharged to:: Private residence Living Arrangements: Spouse/significant other Available Help at Discharge: Family;Available 24 hours/day Type of Home: House Home Access: Stairs to enter [employer] of Steps: 5   Home Layout: Two level;Bed/bath upstairs Alternate Level Stairs-Number of Steps: full flight   Bathroom Shower/Tub: Walk-in shower         Home Equipment: None          Prior Functioning/Environment Level of Independence: Independent                 OT Problem List: Decreased activity tolerance;Impaired balance (sitting and/or standing);Decreased safety awareness;Decreased cognition;Decreased knowledge of use of DME or AE      OT Treatment/Interventions: Self-care/ADL training;Therapeutic exercise;DME and/or AE instruction;Cognitive remediation/compensation;Patient/family education;Balance training    OT Goals(Current goals can be found in the care plan section) Acute Rehab OT Goals Patient Stated Goal: go home OT Goal Formulation: With patient Time For Goal Achievement: 11/08/19 Potential to Achieve Goals: Good  OT Frequency: Min 2X/week    AM-PAC OT "6 Clicks" Daily Activity     Outcome Measure Help from another person eating meals?: None Help from another person taking care of personal grooming?: A Little Help from another person toileting, which includes using toliet, bedpan, or urinal?: A Little Help from another person bathing (including washing, rinsing, drying)?: A Little Help from another person to put on and taking off regular upper body clothing?: A Little Help from another person to put on and taking off regular lower body clothing?: A Little 6 Click Score: 19   End of Session Equipment Utilized During Treatment: Rolling walker Nurse Communication: Mobility  status  Activity Tolerance: Patient tolerated treatment well Patient left: in bed;with call bell/phone within reach;with bed alarm set  OT Visit Diagnosis: Other abnormalities of gait and mobility (R26.89);Other symptoms and signs involving cognitive function                Time: 0940-1004 OT Time Calculation (min): 24 min Charges:  OT General Charges $OT Visit: 1 Visit OT Evaluation $OT Eval Moderate Complexity: 1 Mod OT Treatments $Self Care/Home Management : 8-22 mins  Shon Millet OT OT office: [place] 10/25/2019, 11:52 AM

## 2019-10-26 DIAGNOSIS — F10232 Alcohol dependence with withdrawal with perceptual disturbance: Secondary | ICD-10-CM

## 2019-10-26 LAB — HEPATIC FUNCTION PANEL
ALT: 30 U/L (ref 0–44)
AST: 104 U/L — ABNORMAL HIGH (ref 15–41)
Albumin: 2.3 g/dL — ABNORMAL LOW (ref 3.5–5.0)
Alkaline Phosphatase: 113 U/L (ref 38–126)
Bilirubin, Direct: 5.2 mg/dL — ABNORMAL HIGH (ref 0.0–0.2)
Indirect Bilirubin: 5.1 mg/dL — ABNORMAL HIGH (ref 0.3–0.9)
Total Bilirubin: 10.3 mg/dL — ABNORMAL HIGH (ref 0.3–1.2)
Total Protein: 6.2 g/dL — ABNORMAL LOW (ref 6.5–8.1)

## 2019-10-26 LAB — BASIC METABOLIC PANEL
Anion gap: 15 (ref 5–15)
BUN: 16 mg/dL (ref 8–23)
CO2: 22 mmol/L (ref 22–32)
Calcium: 8.2 mg/dL — ABNORMAL LOW (ref 8.9–10.3)
Chloride: 100 mmol/L (ref 98–111)
Creatinine, Ser: 0.76 mg/dL (ref 0.61–1.24)
GFR calc Af Amer: >60 mL/min (ref >60)
GFR calc non Af Amer: >60 mL/min (ref >60)
Glucose, Bld: 114 mg/dL — ABNORMAL HIGH (ref 70–99)
Potassium: 3.4 mmol/L — ABNORMAL LOW (ref 3.5–5.1)
Sodium: 137 mmol/L (ref 135–145)

## 2019-10-26 LAB — URINALYSIS, ROUTINE W REFLEX MICROSCOPIC
Bacteria, UA: NONE SEEN
Glucose, UA: NEGATIVE mg/dL
Ketones, ur: NEGATIVE mg/dL
Leukocytes,Ua: NEGATIVE
Nitrite: NEGATIVE
Protein, ur: NEGATIVE mg/dL
RBC / HPF: >50 RBC/hpf — ABNORMAL HIGH (ref 0–5)
Specific Gravity, Urine: 1.018 (ref 1.005–1.030)
pH: 7 (ref 5.0–8.0)

## 2019-10-26 LAB — MAGNESIUM: Magnesium: 1.5 mg/dL — ABNORMAL LOW (ref 1.7–2.4)

## 2019-10-26 LAB — CBC
HCT: 30.2 % — ABNORMAL LOW (ref 39.0–52.0)
HCT: 29.8 % — ABNORMAL LOW (ref 39.0–52.0)
Hemoglobin: 10.4 g/dL — ABNORMAL LOW (ref 13.0–17.0)
Hemoglobin: 9.8 g/dL — ABNORMAL LOW (ref 13.0–17.0)
MCH: 37 pg — ABNORMAL HIGH (ref 26.0–34.0)
MCH: 35.6 pg — ABNORMAL HIGH (ref 26.0–34.0)
MCHC: 34.4 g/dL (ref 30.0–36.0)
MCHC: 32.9 g/dL (ref 30.0–36.0)
MCV: 107.5 fL — ABNORMAL HIGH (ref 80.0–100.0)
MCV: 108.4 fL — ABNORMAL HIGH (ref 80.0–100.0)
Platelets: 130 10*3/uL — ABNORMAL LOW (ref 150–400)
Platelets: 131 10*3/uL — ABNORMAL LOW (ref 150–400)
RBC: 2.81 MIL/uL — ABNORMAL LOW (ref 4.22–5.81)
RBC: 2.75 MIL/uL — ABNORMAL LOW (ref 4.22–5.81)
RDW: 18.2 % — ABNORMAL HIGH (ref 11.5–15.5)
RDW: 18 % — ABNORMAL HIGH (ref 11.5–15.5)
WBC: 10.2 10*3/uL (ref 4.0–10.5)
WBC: 10.1 10*3/uL (ref 4.0–10.5)
nRBC: 0 % (ref 0.0–0.2)
nRBC: 0 % (ref 0.0–0.2)

## 2019-10-26 LAB — PHOSPHORUS: Phosphorus: 2.3 mg/dL — ABNORMAL LOW (ref 2.5–4.6)

## 2019-10-26 LAB — COMPREHENSIVE METABOLIC PANEL
ALT: 27 U/L (ref 0–44)
AST: 96 U/L — ABNORMAL HIGH (ref 15–41)
Albumin: 2.3 g/dL — ABNORMAL LOW (ref 3.5–5.0)
Alkaline Phosphatase: 108 U/L (ref 38–126)
Anion gap: 9 (ref 5–15)
BUN: 17 mg/dL (ref 8–23)
CO2: 25 mmol/L (ref 22–32)
Calcium: 8.5 mg/dL — ABNORMAL LOW (ref 8.9–10.3)
Chloride: 103 mmol/L (ref 98–111)
Creatinine, Ser: 0.77 mg/dL (ref 0.61–1.24)
GFR calc Af Amer: >60 mL/min (ref >60)
GFR calc non Af Amer: >60 mL/min (ref >60)
Glucose, Bld: 170 mg/dL — ABNORMAL HIGH (ref 70–99)
Potassium: 3.4 mmol/L — ABNORMAL LOW (ref 3.5–5.1)
Sodium: 137 mmol/L (ref 135–145)
Total Bilirubin: 10.2 mg/dL — ABNORMAL HIGH (ref 0.3–1.2)
Total Protein: 5.9 g/dL — ABNORMAL LOW (ref 6.5–8.1)

## 2019-10-26 LAB — AMMONIA: Ammonia: 27 umol/L (ref 9–35)

## 2019-10-26 LAB — PROTIME-INR
INR: 1.5 — ABNORMAL HIGH (ref 0.8–1.2)
Prothrombin Time: 17.6 seconds — ABNORMAL HIGH (ref 11.4–15.2)

## 2019-10-26 LAB — GLUCOSE, CAPILLARY: Glucose-Capillary: 149 mg/dL — ABNORMAL HIGH (ref 70–99)

## 2019-10-26 MED ORDER — LIP MEDEX EX OINT: TOPICAL_OINTMENT | CUTANEOUS | Status: DC | PRN

## 2019-10-26 MED ORDER — HYDROXYZINE HCL 50 MG/ML IM SOLN
50.0000 mg | INTRAMUSCULAR | Status: DC | PRN
  Administered 2019-10-26: 02:00:00 50 mg via INTRAMUSCULAR
  Filled 2019-10-26 (×2): qty 1

## 2019-10-26 MED ORDER — MAGNESIUM SULFATE 2 GM/50ML IV SOLN
2.0000 g | Freq: Once | INTRAVENOUS | Status: AC
  Administered 2019-10-26: 2 g via INTRAVENOUS
  Filled 2019-10-26: qty 50

## 2019-10-26 MED ORDER — DEXTROSE IN LACTATED RINGERS 5 % IV SOLN: INTRAVENOUS | Status: DC

## 2019-10-26 MED ORDER — CHLORDIAZEPOXIDE HCL 5 MG PO CAPS
5.0000 mg | ORAL_CAPSULE | Freq: Four times a day (QID) | ORAL | Status: DC
  Administered 2019-10-26 – 2019-10-28 (×10): 5 mg via ORAL
  Filled 2019-10-26 (×10): qty 1

## 2019-10-26 MED ORDER — POTASSIUM CHLORIDE 10 MEQ/100ML IV SOLN
10.0000 meq | INTRAVENOUS | Status: AC
  Administered 2019-10-26 (×2): 10 meq via INTRAVENOUS
  Filled 2019-10-26 (×2): qty 100

## 2019-10-26 NOTE — Progress Notes (Signed)
Patient's heart rate in the 170s nonsustained, assessed patient, in room resting, MD rounding on patient notified MD of nonsustained sinus tach. Will continue to assess patient.

## 2019-10-26 NOTE — Progress Notes (Signed)
Pt is disoriented to time, place, situation, not following commands and attempting to leave stating he has to go home and go to work. Security at bed side. This RN attempted to reorient pt. On call [place] paged. See new orders.  Safety precaution implemented. Will continue to monitor.

## 2019-10-26 NOTE — Progress Notes (Signed)
Pt continues to not follow commands and unable to be consoled. Pt is still restless and agitated despite be given PRN ativan. Order for nonviolent restraints initiated. Will continue to monitor closely.

## 2019-10-26 NOTE — Progress Notes (Signed)
Notified by monitor tech that patient had 4runs of V-tach, patient noted in bed resting denies any pain/distress, vitals WNL, Dr. [place] Crane notified, will continue to assess patient

## 2019-10-26 NOTE — Progress Notes (Signed)
PROGRESS NOTE  DRAKO MAESE PXT:[id number] DOB: Jan 06, 1946 DOA: 10/23/2019 PCP: Lawerance Cruel, MD  HPI/Recap of past 24 hours: Richard Irwin  is a 74 y.o. male, w hypertension, Etoh abuse,  anemia, diverticulosis, h/o GI bleeding,  Copd, was at the bar standing at home when had syncope, per wife his eyes rolled back. No tongue laceration, no incontinence. No tonic clonic movement.  Pt states lasted a few sec.  Ended up on floor,  Wife called EMS.    Worsening transaminitis.  T bili trending up to 10.4 from 9.4 from 6.4.  GI assisting with the management.  10/26/19: Agitated overnight.  Likely alcohol withdrawal.  CIWA protocol in place.  Alert with soft restraints.  Denies any pain. .  Assessment/Plan: Principal Problem:   Syncope Active Problems:   Essential hypertension   Abnormal liver function   Alcohol abuse   Hyponatremia   Protein-calorie malnutrition, severe (HCC)   Malnutrition of moderate degree   Syncope, possibly cardiac etiology Work-up unrevealing thus far.  Would benefit from Holter monitor or loop recorder. 2D echo no LVH.  Abnormal septal motion with inferior basal hypokinesis.  LVEF 50 to 55%.  There was moderate to severe dilatation of the aortic root measuring 4.7 cm.  Consider follow-up CTA chest to size Orthostatic vital signs pending EEG unremarkable No evidence of significant carotid stenosis on bilateral Doppler ultrasound MRI brain, patient declines Continue fall precautions OT recommended home health OT and 24 hours assistance and supervision.  Acute metabolic encephalopathy likely in the setting of alcohol withdrawal Reorient as needed Continue CIWA protocol Agitated, on soft restraints for patient's own safety  Alcohol abuse with concern for alcohol withdrawal Presented intoxicated with elevated alcohol level on 10/23/2019. Likely withdrawing from alcohol CIWA protocol in place  Worsening hyperbilirubinemia/alcoholic hepatitis No  evidence of gallstones on right upper quadrant ultrasound T. Bilirubin 3.8 trending up 9.4>> 10.4  Coagulopathy in the setting of alcohol liver disease Mild coagulopathy with INR between 1.5.  Acute drop in Hg/plt suspect bone marrow suppression in the setting of chronic alcohol use Denies melena or hematochezia Hemoglobin stable, trending up 10.4 from 9.9 No sign of overt bleeding  History of adenomatous colon polyps in 2010 in 2015 Colonoscopy planned with GI [place] Dr. Fuller Plan outpatient on 11/28/2019.  Alcohol abuse disorder Elevated alcohol level on presentation CIWA protocol Alcohol cessation counseling done at bedside  Essential hypertension Blood pressure stable Continue losartan  ?Depression/chronic alcohol abuse TOC consulted to provide resources.  Ambulatory dysfunction OT recommended home health OT Fall precautions   Code Status: Full code  Family Communication: None at bedside  Disposition Plan: Patient is from home.  Anticipate discharge to home once GI signs off.  Barrier to discharge ongoing work-up.   Consultants:  GI  Procedures:  EEG  Antimicrobials:  None  DVT prophylaxis: SCDs   Objective: Vitals:   10/26/19 0133 10/26/19 0446 10/26/19 1031 10/26/19 1317  BP: (!) 142/105 136/88 122/75 121/85  Pulse: (!) 103 (!) 101 84 90  Resp: 18 20 19 14   Temp: 99.1 F (37.3 C) 98.5 F (36.9 C) 98 F (36.7 C) 99 F (37.2 C)  TempSrc:   Oral Oral  SpO2: 96% 95% 95% 95%  Weight:      Height:        Intake/Output Summary (Last 24 hours) at 10/26/2019 1403 Last data filed at 10/26/2019 0600 Gross per 24 hour  Intake 1157.05 ml  Output --  Net 1157.05 ml  Filed Weights   10/23/19 1808 10/24/19 0500 10/25/19 0640  Weight: 74.8 kg 75.5 kg 73.4 kg    Exam:  . General: 74 y.o. year-old male well-developed well-nourished alert and confused. . Cardiovascular: Regular rate and rhythm no rubs or gallops. Marland Kitchen Respiratory: Clear to Auscultation  No Wheezes No Rales.   . Abdomen: Soft bowel sounds present. musculoskeletal: No lower extremity edema bilaterally.   Psychiatry: Unable to appreciate mood due to altered mental status.  Data Reviewed: CBC: Recent Labs  Lab 10/23/19 1814 10/24/19 0253 10/25/19 0455 10/26/19 0606  WBC 9.2 8.7 8.4 10.2  HGB 12.0* 10.3* 9.9* 10.4*  HCT 35.2* 29.5* 28.7* 30.2*  MCV 105.1* 105.0* 106.3* 107.5*  PLT 199 149* 137* 497*   Basic Metabolic Panel: Recent Labs  Lab 10/23/19 1814 10/24/19 0253 10/25/19 0252 10/26/19 0606  NA 133* 135 137 137  K 3.9 4.4 4.2 3.4*  CL 93* 99 102 100  CO2 26 27 30 22   GLUCOSE 145* 132* 113* 114*  BUN 13 13 17 16   CREATININE 1.45* 1.33* 0.93 0.76  CALCIUM 8.8* 8.2* 8.6* 8.2*   GFR: Estimated Creatinine Clearance: 85.4 mL/min (by C-G formula based on SCr of 0.76 mg/dL). Liver Function Tests: Recent Labs  Lab 10/23/19 1814 10/24/19 0253 10/25/19 0252 10/26/19 0606  AST 156* 123* 116* 104*  ALT 39 31 31 30   ALKPHOS 158* 124 131* 113  BILITOT 8.2* 6.4* 9.4* 10.3*  PROT 7.5 6.1* 6.2* 6.2*  ALBUMIN 3.0* 2.5* 2.4* 2.3*   No results for input(s): LIPASE, AMYLASE in the last 168 hours. Recent Labs  Lab 10/23/19 2157 10/26/19 0721  AMMONIA 38* 27   Coagulation Profile: Recent Labs  Lab 10/23/19 2010 10/25/19 0252 10/26/19 0606  INR 1.3* 1.4* 1.5*   Cardiac Enzymes: No results for input(s): CKTOTAL, CKMB, CKMBINDEX, TROPONINI in the last 168 hours. BNP (last 3 results) No results for input(s): PROBNP in the last 8760 hours. HbA1C: No results for input(s): HGBA1C in the last 72 hours. CBG: Recent Labs  Lab 10/23/19 2013  GLUCAP 127*   Lipid Profile: No results for input(s): CHOL, HDL, LDLCALC, TRIG, CHOLHDL, LDLDIRECT in the last 72 hours. Thyroid Function Tests: Recent Labs    10/24/19 0253  TSH 1.612   Anemia Panel: Recent Labs    10/24/19 0610  VITAMINB12 609  FOLATE 6.6  FERRITIN 2,466*  TIBC 116*  IRON 108   RETICCTPCT 2.0   Urine analysis:    Component Value Date/Time   COLORURINE YELLOW 10/23/2019 1814   APPEARANCEUR CLEAR 10/23/2019 1814   LABSPEC 1.010 10/23/2019 1814   PHURINE 7.0 10/23/2019 1814   GLUCOSEU NEGATIVE 10/23/2019 1814   HGBUR NEGATIVE 10/23/2019 1814   BILIRUBINUR NEGATIVE 10/23/2019 1814   KETONESUR NEGATIVE 10/23/2019 1814   PROTEINUR NEGATIVE 10/23/2019 1814   UROBILINOGEN 0.2 08/29/2008 2215   NITRITE NEGATIVE 10/23/2019 1814   LEUKOCYTESUR NEGATIVE 10/23/2019 1814   Sepsis Labs: @LABRCNTIP (procalcitonin:4,lacticidven:4)  ) Recent Results (from the past 240 hour(s))  SARS CORONAVIRUS 2 (TAT 6-24 HRS) Nasopharyngeal Nasopharyngeal Swab     Status: None   Collection Time: 10/23/19  9:57 PM   Specimen: Nasopharyngeal Swab  Result Value Ref Range Status   SARS Coronavirus 2 NEGATIVE NEGATIVE Final    Comment: (NOTE) SARS-CoV-2 target nucleic acids are NOT DETECTED. The SARS-CoV-2 RNA is generally detectable in upper and lower respiratory specimens during the acute phase of infection. Negative results do not preclude SARS-CoV-2 infection, do not rule out co-infections with other  pathogens, and should not be used as the sole basis for treatment or other patient management decisions. Negative results must be combined with clinical observations, patient history, and epidemiological information. The expected result is Negative. Fact Sheet for Patients: SugarRoll.be Fact Sheet for Healthcare Providers: https://www.woods-mathews.com/ This test is not yet approved or cleared by the [place] FDA and  has been authorized for detection and/or diagnosis of SARS-CoV-2 by FDA under an Emergency Use Authorization (EUA). This EUA will remain  in effect (meaning this test can be used) for the duration of the COVID-19 declaration under Section 56 4(b)(1) of the Act, 21 U.S.C. section 360bbb-3(b)(1), unless the authorization is  terminated or revoked sooner. Performed at [facility], [place] [address] [place], [place] [address]       Studies: No results found.  Scheduled Meds: . feeding supplement (ENSURE ENLIVE)  237 mL Oral BID BM  . feeding supplement (PRO-STAT SUGAR FREE 64)  30 mL Oral BID  . folic acid  1 mg Oral Daily  . LORazepam  0-4 mg Intravenous Q12H  . losartan  100 mg Oral Daily  . multivitamin with minerals  1 tablet Oral Daily  . sodium chloride flush  3 mL Intravenous Once  . thiamine  100 mg Oral Daily   Or  . thiamine  100 mg Intravenous Daily    Continuous Infusions: . lactated ringers 50 mL/hr at 10/26/19 0145  . potassium chloride       LOS: 2 days     Kayleen Memos, MD [facility] Pager [phone]  If 7PM-7AM, please contact night-coverage [email or web address] Password [email or web address] 10/26/2019, 2:03 PM

## 2019-10-26 NOTE — Progress Notes (Signed)
Updated the patient's spouse.  All questions answered.

## 2019-10-26 NOTE — Progress Notes (Addendum)
Patient had 20beats runs of V-tach, patient in no pain but agitated,  vital normal, PRN ativan given and Dr. Nevada Crane notified, also informed MD of some blood in the urine urine, UA ordered. will continue to assess patient.

## 2019-10-26 NOTE — Progress Notes (Signed)
[facility] Progress Note  CC:  Etoh Hepatitis  Subjective: He is sedated, received Ativan 81m IV  at 11 am but arousable. [place] is confused. [place]   Patient disoriented at 2am today as reported by RN.Security called to bedside. Patient received Vistaril 589mIM injection.   Objective:  Vital signs in last 24 hours: Temp:  [98.5 F (36.9 C)-99.1 F (37.3 C)] 98.5 F (36.9 C) (02/06 0446) Pulse Rate:  [82-103] 101 (02/06 0446) Resp:  [18-20] 20 (02/06 0446) BP: (119-143)/(81-105) 136/88 (02/06 0446) SpO2:  [93 %-96 %] 95 % (02/06 0446) Weight:  [73.4 kg] 73.4 kg (02/05 0640) Last BM Date: 10/23/19 General: He is sedated, awakens to name and tactile pressure to chest.  Heart: RRR, no murmur.  Pulm: Coarse sounds clear throughout.  Abdomen: Soft, nontender, + BS x 4 quads, no HSM. Extremities:  Without edema. Neurologic:  Sedated. Confused. Followed simple commands. Unable to perform asterixis exam. Psych: Agitated earlier this am, now sedated.   Intake/Output from previous day: 02/05 0701 - 02/06 0700 In: 1197.5 [P.O.:240; I.V.:957.5] Out: -  Intake/Output this shift: Total I/O In: 534.2 [I.V.:534.2] Out: -   Lab Results: Recent Labs    10/23/19 1814 10/24/19 0253 10/25/19 0455  WBC 9.2 8.7 8.4  HGB 12.0* 10.3* 9.9*  HCT 35.2* 29.5* 28.7*  PLT 199 149* 137*   BMET Recent Labs    10/23/19 1814 10/24/19 0253 10/25/19 0252  NA 133* 135 137  K 3.9 4.4 4.2  CL 93* 99 102  CO2 26 27 30   GLUCOSE 145* 132* 113*  BUN 13 13 17   CREATININE 1.45* 1.33* 0.93  CALCIUM 8.8* 8.2* 8.6*   LFT Recent Labs    10/24/19 0253 10/24/19 0610 10/25/19 0252  PROT   < >  --  6.2*  ALBUMIN   < >  --  2.4*  AST   < >  --  116*  ALT   < >  --  31  ALKPHOS   < >  --  131*  BILITOT   < >  --  9.4*  BILIDIR  --  3.8*  --    < > = values in this interval not displayed.   PT/INR Recent Labs    10/23/19 2010  10/25/19 0252  LABPROT 16.0* 17.2*  INR 1.3* 1.4*   Hepatitis Panel Recent Labs    10/23/19 2010  HEPBSAG NON REACTIVE  HCVAB NON REACTIVE  HEPAIGM NON REACTIVE  HEPBIGM NON REACTIVE    EEG  Result Date: 10/24/2019 YaLora HavensMD     10/24/2019  2:24 PM Patient Name: Richard GRANTHAMRN: [id number] Attending: PrLora Havenseferring Physician/Provider: Dr. JaJani Gravelate: 10/24/2019 Duration: 24.01 minutes Patient history: [age]ear old male with history of alcohol abuse who presented after syncopal episode.  EEG to evaluate for seizures. Level of alertness: Awake AEDs during EEG study: Lorazepam Technical aspects: This EEG study was done with scalp electrodes positioned according to the 10-20 International system of electrode placement. Electrical activity was acquired at a sampling rate of 500Hz  and reviewed with a high frequency filter of 70Hz  and a low frequency filter of 1Hz . EEG data were recorded continuously and digitally stored. Description: The posterior dominant rhythm consists of 8-9 Hz activity of moderate voltage (25-35 uV) seen predominantly in posterior head regions, symmetric and reactive to eye opening and eye closing. Hyperventilation and photic stimulation were  not performed. IMPRESSION: This study is within normal limits. No seizures or epileptiform discharges were seen throughout the recording. Richard Irwin   ECHOCARDIOGRAM COMPLETE  Result Date: 10/24/2019   ECHOCARDIOGRAM REPORT   Patient Name:   Richard Irwin Date of Exam: 10/24/2019 Medical Rec #:  [id number]         Height:       71.0 in Accession #:    [id number]        Weight:       166.4 lb Date of Birth:  Mar 01, 1946         BSA:          1.95 m Patient Age:    70 years          BP:           114/78 mmHg Patient Gender: M                 HR:           110 bpm. Exam Location:  Inpatient Procedure: 2D Echo, Cardiac Doppler and Color Doppler Indications:    Syncope  History:        Patient has prior  history of Echocardiogram examinations, most                 recent 03/31/2013. COPD; Risk Factors:Hypertension. ETOH abuse,                 GI bleed, abnormal liver function.  Sonographer:    Dustin Flock Referring Phys: [place]  1. Left ventricular ejection fraction, by visual estimation, is 50 to 55%. The left ventricle has low normal function. There is no left ventricular hypertrophy.  2. Left ventricular diastolic parameters are indeterminate.  3. The left ventricle demonstrates regional wall motion abnormalities.  4. Abnormal septal motion with inferior basal hypokinesis.  5. Global right ventricle has normal systolic function.The right ventricular size is normal. No increase in right ventricular wall thickness.  6. Left atrial size was normal.  7. Right atrial size was normal.  8. Mild mitral annular calcification.  9. The mitral valve is normal in structure. No evidence of mitral valve regurgitation. No evidence of mitral stenosis. 10. The tricuspid valve is normal in structure. 11. The tricuspid valve is normal in structure. Tricuspid valve regurgitation is not demonstrated. 12. The aortic valve is normal in structure. Aortic valve regurgitation is not visualized. No evidence of aortic valve sclerosis or stenosis. 13. The pulmonic valve was normal in structure. Pulmonic valve regurgitation is not visualized. 14. Aortic dilatation noted. 15. There is moderate to severe dilatation of the aortic root measuring 47 mm. 16. Consider f/u CTA chest to size aorta. 17. The inferior vena cava is normal in size with greater than 50% respiratory variability, suggesting right atrial pressure of 3 mmHg. 18. Limited apical views obtained and no subcostal views. 19. The interatrial septum was not assessed. FINDINGS  Left Ventricle: Left ventricular ejection fraction, by visual estimation, is 50 to 55%. The left ventricle has low normal function. The left ventricle demonstrates regional wall motion  abnormalities. There is no left ventricular hypertrophy. Left ventricular diastolic parameters are indeterminate. Normal left atrial pressure. Abnormal septal motion with inferior basal hypokinesis. Right Ventricle: The right ventricular size is normal. No increase in right ventricular wall thickness. Global RV systolic function is has normal systolic function. Left Atrium: Left atrial size was normal in size. Right Atrium: Right atrial size was normal in  size Pericardium: There is no evidence of pericardial effusion. Mitral Valve: The mitral valve is normal in structure. There is mild thickening of the mitral valve leaflet(s). There is mild calcification of the mitral valve leaflet(s). Mild mitral annular calcification. No evidence of mitral valve regurgitation. No evidence of mitral valve stenosis by observation. Tricuspid Valve: The tricuspid valve is normal in structure. Tricuspid valve regurgitation is not demonstrated. Aortic Valve: The aortic valve is normal in structure. Aortic valve regurgitation is not visualized. The aortic valve is structurally normal, with no evidence of sclerosis or stenosis. Pulmonic Valve: The pulmonic valve was normal in structure. Pulmonic valve regurgitation is not visualized. Pulmonic regurgitation is not visualized. Aorta: The aortic root, ascending aorta and aortic arch are all structurally normal, with no evidence of dilitation or obstruction and aortic dilatation noted. There is moderate to severe dilatation of the aortic root measuring 47 mm. Consider f/u CTA chest to size aorta. Venous: The inferior vena cava is normal in size with greater than 50% respiratory variability, suggesting right atrial pressure of 3 mmHg. IAS/Shunts: The interatrial septum was not assessed. There is no evidence of a patent foramen ovale. No ventricular septal defect is seen or detected. There is no evidence of an atrial septal defect. Additional Comments: Limited apical views obtained and no  subcostal views.  Jenkins Rouge MD Electronically signed by Jenkins Rouge MD Signature Date/Time: 10/24/2019/11:40:26 AM    Final    VAS US CAROTID  Result Date: 10/24/2019 Carotid Arterial Duplex Study Indications:       Syncope. Risk Factors:      Hypertension. Comparison Study:  No prior study Performing Technologist: Maudry Mayhew MHA, RDMS, RVT, RDCS  Examination Guidelines: A complete evaluation includes B-mode imaging, spectral Doppler, color Doppler, and power Doppler as needed of all accessible portions of each vessel. Bilateral testing is considered an integral part of a complete examination. Limited examinations for reoccurring indications may be performed as noted.  Right Carotid Findings: +----------+--------+--------+--------+-----------------------+--------+           PSV cm/sEDV cm/sStenosisPlaque Description     Comments +----------+--------+--------+--------+-----------------------+--------+ CCA Prox  78      18              smooth and heterogenous         +----------+--------+--------+--------+-----------------------+--------+ CCA Distal66      20                                              +----------+--------+--------+--------+-----------------------+--------+ ICA Prox  93      24              calcific                        +----------+--------+--------+--------+-----------------------+--------+ ICA Distal140     36                                              +----------+--------+--------+--------+-----------------------+--------+ ECA       120     16              calcific                        +----------+--------+--------+--------+-----------------------+--------+ +----------+--------+-------+----------------+-------------------+  PSV cm/sEDV cmsDescribe        Arm Pressure (mmHG) +----------+--------+-------+----------------+-------------------+ [id number]             Multiphasic, WNL                     +----------+--------+-------+----------------+-------------------+ +---------+--------+--+--------+-+---------+ VertebralPSV cm/s17EDV cm/s6Antegrade +---------+--------+--+--------+-+---------+  Left Carotid Findings: +----------+-------+-------+--------+---------------------------------+--------+           PSV    EDV    StenosisPlaque Description               Comments           cm/s   cm/s                                                     +----------+-------+-------+--------+---------------------------------+--------+ CCA Prox  101    23                                                       +----------+-------+-------+--------+---------------------------------+--------+ CCA Distal122    30             irregular and heterogenous                +----------+-------+-------+--------+---------------------------------+--------+ ICA Prox  52     16             irregular, heterogenous and                                               calcific                                  +----------+-------+-------+--------+---------------------------------+--------+ ICA Distal70     23             calcific                                  +----------+-------+-------+--------+---------------------------------+--------+ ECA       92     19                                                       +----------+-------+-------+--------+---------------------------------+--------+ +----------+--------+--------+----------------+-------------------+           PSV cm/sEDV cm/sDescribe        Arm Pressure (mmHG) +----------+--------+--------+----------------+-------------------+ [id number]             Multiphasic, WNL                    +----------+--------+--------+----------------+-------------------+ +---------+--------+--+--------+--+---------+ VertebralPSV cm/s40EDV cm/s13Antegrade +---------+--------+--+--------+--+---------+   Summary: Right Carotid:  Velocities in the right ICA are consistent with a 1-39% stenosis. Left Carotid: Velocities in the left ICA are consistent with a 1-39% stenosis. Vertebrals:  Bilateral vertebral arteries demonstrate antegrade flow. Subclavians: Normal flow hemodynamics were seen in bilateral  subclavian              arteries. *See table(s) above for measurements and observations.  Electronically signed by Antony Contras MD on 10/24/2019 at 12:40:33 PM.    Final    US Abdomen Limited RUQ  Result Date: 10/25/2019 CLINICAL DATA:  Acalculous cholecystitis EXAM: ULTRASOUND ABDOMEN LIMITED RIGHT UPPER QUADRANT COMPARISON:  None FINDINGS: Gallbladder: Mild thickening of gallbladder wall. No gallstones, pericholecystic fluid or sonographic Murphy sign. Common bile duct: Diameter: 5 mm diameter, normal Liver: Echogenic parenchyma, likely fatty infiltration though this can be seen with cirrhosis and certain infiltrative disorders. No focal hepatic mass or nodularity are identified, though assessment of intrahepatic detail is suboptimal due to sound attenuation. Portal vein is patent on color Doppler imaging with normal direction of blood flow towards the liver. Other: No RIGHT upper quadrant free fluid. IMPRESSION: Mild gallbladder wall thickening without additional gallbladder findings, nonspecific. If there is persistent clinical concern for acalculous cholecystitis, consider follow-up radionuclide hepatobiliary imaging. Probable fatty infiltration of liver as above. Electronically Signed   By: Lavonia Dana M.D.   On: 10/25/2019 08:28    Assessment / Plan:  46.  74 year old male with a history of alcohol abuse with elevated LFTs.  AST/ALT ratio consistent with EtOH hepatitis. CT abdomen pelvis 2/3 identified a fatty liver, gallbladder wall thickening without evidence of stones or acute cholecystitis. Increasing discriminant function 30.1.  Alk phos 113. AST 104. ALT 104. T. Bili 10.3. Direct bili 5.2. -Should start Prednisolone, await  recommendation from Dr. Rush Landmark.   2. Encephalopathic, secondary to EtOH hepatitis + Etoh withdrawal. Ammonia 27. WBC 10.2. He is afebrile. No evidence of sepsis.  -CIWA protocol initiated, avoid over sedation -Monitor neurostatus closely  3. Syncope of unclear etiology. Noncritical bilateral ICA stenosis per Dopplers. He remains hemodynamically stable.  -Consider cardiac evaluation as inpatient, defer to the hospitalist service.   3. Mild coagulopathy.  INR 1.5 up from 1.4 -avoid IM injections   4.  Chronic macrocytic anemia. Hg drifting downward.  Today Hg 10.4.k HCT 30.2. No obvious signs of GI bleeding.  -Recommend EGD to be done at the time of his colonoscopy, be scheduled at his office appointment with Dr. Fuller Plan on 11/28/2019.  He will require an earlier procedure date if he demonstrates evidence of active GI bleeding.  5. Thrombocytopenia.  Platelet count 130.  6. History of adenomatous colon polyps in 2010 and 2015.  Past due for surveillance colonoscopy.  Patient has appointment with Dr. Fuller Plan on 11/28/2019 to schedule a colonoscopy.  Recommend EGD to be done at the time of his colonoscopy if not done during this hospitalization.     Principal Problem:   Syncope Active Problems:   Essential hypertension   Abnormal liver function   Alcohol abuse   Hyponatremia   Protein-calorie malnutrition, severe (HCC)   Malnutrition of moderate degree     LOS: 2 days   Noralyn Pick  10/26/2019, 5:23 AM

## 2019-10-27 LAB — COMPREHENSIVE METABOLIC PANEL
ALT: 29 U/L (ref 0–44)
AST: 91 U/L — ABNORMAL HIGH (ref 15–41)
Albumin: 2.2 g/dL — ABNORMAL LOW (ref 3.5–5.0)
Alkaline Phosphatase: 113 U/L (ref 38–126)
Anion gap: 6 (ref 5–15)
BUN: 16 mg/dL (ref 8–23)
CO2: 28 mmol/L (ref 22–32)
Calcium: 8.6 mg/dL — ABNORMAL LOW (ref 8.9–10.3)
Chloride: 104 mmol/L (ref 98–111)
Creatinine, Ser: 0.79 mg/dL (ref 0.61–1.24)
GFR calc Af Amer: >60 mL/min (ref >60)
GFR calc non Af Amer: >60 mL/min (ref >60)
Glucose, Bld: 136 mg/dL — ABNORMAL HIGH (ref 70–99)
Potassium: 3.6 mmol/L (ref 3.5–5.1)
Sodium: 138 mmol/L (ref 135–145)
Total Bilirubin: 9.1 mg/dL — ABNORMAL HIGH (ref 0.3–1.2)
Total Protein: 6 g/dL — ABNORMAL LOW (ref 6.5–8.1)

## 2019-10-27 LAB — CBC
HCT: 28.7 % — ABNORMAL LOW (ref 39.0–52.0)
Hemoglobin: 9.7 g/dL — ABNORMAL LOW (ref 13.0–17.0)
MCH: 36.2 pg — ABNORMAL HIGH (ref 26.0–34.0)
MCHC: 33.8 g/dL (ref 30.0–36.0)
MCV: 107.1 fL — ABNORMAL HIGH (ref 80.0–100.0)
Platelets: 133 10*3/uL — ABNORMAL LOW (ref 150–400)
RBC: 2.68 MIL/uL — ABNORMAL LOW (ref 4.22–5.81)
RDW: 18.1 % — ABNORMAL HIGH (ref 11.5–15.5)
WBC: 10.1 10*3/uL (ref 4.0–10.5)
nRBC: 0 % (ref 0.0–0.2)

## 2019-10-27 LAB — GLUCOSE, CAPILLARY
Glucose-Capillary: 123 mg/dL — ABNORMAL HIGH (ref 70–99)
Glucose-Capillary: 127 mg/dL — ABNORMAL HIGH (ref 70–99)
Glucose-Capillary: 144 mg/dL — ABNORMAL HIGH (ref 70–99)

## 2019-10-27 LAB — PHOSPHORUS: Phosphorus: 2.8 mg/dL (ref 2.5–4.6)

## 2019-10-27 LAB — PROTIME-INR
INR: 1.4 — ABNORMAL HIGH (ref 0.8–1.2)
Prothrombin Time: 17.2 seconds — ABNORMAL HIGH (ref 11.4–15.2)

## 2019-10-27 LAB — MAGNESIUM: Magnesium: 1.7 mg/dL (ref 1.7–2.4)

## 2019-10-27 MED ORDER — SODIUM PHOSPHATES 45 MMOLE/15ML IV SOLN: 10.0000 mmol | Freq: Once | INTRAVENOUS | Status: DC

## 2019-10-27 MED ORDER — MAGNESIUM SULFATE 2 GM/50ML IV SOLN
2.0000 g | Freq: Once | INTRAVENOUS | Status: AC
  Administered 2019-10-27: 2 g via INTRAVENOUS
  Filled 2019-10-27: qty 50

## 2019-10-27 MED ORDER — SODIUM PHOSPHATES 45 MMOLE/15ML IV SOLN
20.0000 mmol | Freq: Once | INTRAVENOUS | Status: AC
  Administered 2019-10-27: 20 mmol via INTRAVENOUS
  Filled 2019-10-27: qty 6.67

## 2019-10-27 MED ORDER — MAGNESIUM SULFATE IN D5W 1-5 GM/100ML-% IV SOLN: 1.0000 g | Freq: Once | INTRAVENOUS | Status: DC

## 2019-10-27 NOTE — Progress Notes (Signed)
PROGRESS NOTE  Richard Irwin BJY:[id number] DOB: 1946-01-14 DOA: 10/23/2019 PCP: Lawerance Cruel, MD  HPI/Recap of past 24 hours: Richard Irwin  is a 74 y.o. male, w hypertension, Etoh abuse,  anemia, diverticulosis, h/o GI bleeding,  Copd, was at the bar standing at home when had syncope, per wife his eyes rolled back. No tongue laceration, no incontinence. No tonic clonic movement.  Pt states lasted a few sec.  Ended up on floor,  Wife called EMS.    On presentation had elevated AST and significantly elevated T bilirubin, peaked at 10.2.  Hospital course complicated by acute alcohol withdrawal.  On CIWA protocol with Librium.  10/27/19: Seen and examined.  Mentation is improved today.  He denies any pain.  AST and T bilirubin are trending down.  Assessment/Plan: Principal Problem:   Syncope Active Problems:   Essential hypertension   Abnormal liver function   Alcohol abuse   Hyponatremia   Protein-calorie malnutrition, severe (HCC)   Malnutrition of moderate degree   Syncope, possibly cardiac etiology Work-up unrevealing thus far.  Would benefit from Holter monitor or loop recorder. 2D echo no LVH.  Abnormal septal motion with inferior basal hypokinesis.  LVEF 50 to 55%.  There was moderate to severe dilatation of the aortic root measuring 4.7 cm.  Consider follow-up CTA chest to size Orthostatic vital signs pending EEG unremarkable No evidence of significant carotid stenosis on bilateral Doppler ultrasound MRI brain, patient declines Continue fall precautions OT recommended home health OT and 24 hours assistance and supervision.  Acute metabolic encephalopathy likely alcohol withdrawal On 10/26/2019: agitated, tachycardia, disoriented Reorient as needed Continue CIWA protocol, added Librium. Mentation is improved today. Added IV fluid D5 LR due to poor oral intake. Feeding assistance as needed.  Alcohol abuse with concern for alcohol withdrawal Presented intoxicated  with elevated alcohol level on 10/23/2019. Likely withdrawing from alcohol Continue CIWA protocol Alcohol cessation counseling at bedside.  Hyperbilirubinemia/alcoholic hepatitis No evidence of gallstones on right upper quadrant ultrasound T. Bilirubin 3.8 trending up 9.4>> 10.4>>9.1  Coagulopathy in the setting of alcohol liver disease Mild coagulopathy with INR between 1.5>> 1.4.  Acute drop in Hg/plt suspect bone marrow suppression in the setting of chronic alcohol use Denies melena or hematochezia Hemoglobin stable at 9.7 from 9.8. No sign of overt bleeding.  History of adenomatous colon polyps in 2010 in 2015 Colonoscopy planned with GI [place] Dr. Fuller Plan outpatient on 11/28/2019.  Essential hypertension Blood pressure stable Continue losartan  ?Depression/chronic alcohol abuse TOC consulted to provide resources.  Ambulatory dysfunction OT recommended home health OT Continue fall precautions   Code Status: Full code  Family Communication: None at bedside  Disposition Plan: Patient is from home.  Anticipate discharge to home once GI signs off.  Barrier to discharge ongoing work-up and treatment for alcohol withdrawal.   Consultants:  GI  Procedures:  EEG  Antimicrobials:  None  DVT prophylaxis: SCDs   Objective: Vitals:   10/26/19 2140 10/26/19 2300 10/27/19 0447 10/27/19 1336  BP: (!) 141/89 128/90 128/85 127/83  Pulse: (!) 103 (!) 103 95 96  Resp:   18 18  Temp: (!) 97.3 F (36.3 C)  98.7 F (37.1 C) 98.9 F (37.2 C)  TempSrc: Oral  Oral   SpO2: 95%  96% 93%  Weight:      Height:        Intake/Output Summary (Last 24 hours) at 10/27/2019 1422 Last data filed at 10/27/2019 1016 Gross per 24 hour  Intake  2885.05 ml  Output 850 ml  Net 2035.05 ml   Filed Weights   10/23/19 1808 10/24/19 0500 10/25/19 0640  Weight: 74.8 kg 75.5 kg 73.4 kg    Exam:  . General: 74 y.o. year-old male well-developed well-nourished no acute distress.  Alert  oriented x2.  Mentation improved from yesterday. . Cardiovascular: Regular rate and rhythm no rubs or gallops.   Marland Kitchen Respiratory: Clear to auscultation no wheezes no Rales.   . Abdomen: Soft nontender normal bowel sounds present. musculoskeletal: No lower extremity edema bilaterally.   Psychiatry: Mood is appropriate for condition and setting.  Data Reviewed: CBC: Recent Labs  Lab 10/24/19 0253 10/25/19 0455 10/26/19 0606 10/26/19 1834 10/27/19 0343  WBC 8.7 8.4 10.2 10.1 10.1  HGB 10.3* 9.9* 10.4* 9.8* 9.7*  HCT 29.5* 28.7* 30.2* 29.8* 28.7*  MCV 105.0* 106.3* 107.5* 108.4* 107.1*  PLT 149* 137* 130* 131* 681*   Basic Metabolic Panel: Recent Labs  Lab 10/24/19 0253 10/25/19 0252 10/26/19 0606 10/26/19 1834 10/27/19 0343  NA 135 137 137 137 138  K 4.4 4.2 3.4* 3.4* 3.6  CL 99 102 100 103 104  CO2 27 30 22 25 28   GLUCOSE 132* 113* 114* 170* 136*  BUN 13 17 16 17 16   CREATININE 1.33* 0.93 0.76 0.77 0.79  CALCIUM 8.2* 8.6* 8.2* 8.5* 8.6*  MG  --   --   --  1.5* 1.7  PHOS  --   --   --  2.3* 2.8   GFR: Estimated Creatinine Clearance: 85.4 mL/min (by C-G formula based on SCr of 0.79 mg/dL). Liver Function Tests: Recent Labs  Lab 10/24/19 0253 10/25/19 0252 10/26/19 0606 10/26/19 1834 10/27/19 0343  AST 123* 116* 104* 96* 91*  ALT 31 31 30 27 29   ALKPHOS 124 131* 113 108 113  BILITOT 6.4* 9.4* 10.3* 10.2* 9.1*  PROT 6.1* 6.2* 6.2* 5.9* 6.0*  ALBUMIN 2.5* 2.4* 2.3* 2.3* 2.2*   No results for input(s): LIPASE, AMYLASE in the last 168 hours. Recent Labs  Lab 10/23/19 2157 10/26/19 0721  AMMONIA 38* 27   Coagulation Profile: Recent Labs  Lab 10/23/19 2010 10/25/19 0252 10/26/19 0606 10/27/19 0343  INR 1.3* 1.4* 1.5* 1.4*   Cardiac Enzymes: No results for input(s): CKTOTAL, CKMB, CKMBINDEX, TROPONINI in the last 168 hours. BNP (last 3 results) No results for input(s): PROBNP in the last 8760 hours. HbA1C: No results for input(s): HGBA1C in the last 72  hours. CBG: Recent Labs  Lab 10/23/19 2013 10/26/19 1746 10/27/19 0444 10/27/19 1144  GLUCAP 127* 149* 123* 127*   Lipid Profile: No results for input(s): CHOL, HDL, LDLCALC, TRIG, CHOLHDL, LDLDIRECT in the last 72 hours. Thyroid Function Tests: No results for input(s): TSH, T4TOTAL, FREET4, T3FREE, THYROIDAB in the last 72 hours. Anemia Panel: No results for input(s): VITAMINB12, FOLATE, FERRITIN, TIBC, IRON, RETICCTPCT in the last 72 hours. Urine analysis:    Component Value Date/Time   COLORURINE AMBER (A) 10/26/2019 1728   APPEARANCEUR CLEAR 10/26/2019 1728   LABSPEC 1.018 10/26/2019 1728   PHURINE 7.0 10/26/2019 1728   GLUCOSEU NEGATIVE 10/26/2019 1728   HGBUR LARGE (A) 10/26/2019 1728   BILIRUBINUR MODERATE (A) 10/26/2019 1728   KETONESUR NEGATIVE 10/26/2019 1728   PROTEINUR NEGATIVE 10/26/2019 1728   UROBILINOGEN 0.2 08/29/2008 2215   NITRITE NEGATIVE 10/26/2019 1728   LEUKOCYTESUR NEGATIVE 10/26/2019 1728   Sepsis Labs: @LABRCNTIP (procalcitonin:4,lacticidven:4)  ) Recent Results (from the past 240 hour(s))  SARS CORONAVIRUS 2 (TAT 6-24 HRS) Nasopharyngeal  Nasopharyngeal Swab     Status: None   Collection Time: 10/23/19  9:57 PM   Specimen: Nasopharyngeal Swab  Result Value Ref Range Status   SARS Coronavirus 2 NEGATIVE NEGATIVE Final    Comment: (NOTE) SARS-CoV-2 target nucleic acids are NOT DETECTED. The SARS-CoV-2 RNA is generally detectable in upper and lower respiratory specimens during the acute phase of infection. Negative results do not preclude SARS-CoV-2 infection, do not rule out co-infections with other pathogens, and should not be used as the sole basis for treatment or other patient management decisions. Negative results must be combined with clinical observations, patient history, and epidemiological information. The expected result is Negative. Fact Sheet for Patients: SugarRoll.be Fact Sheet for Healthcare  Providers: https://www.woods-mathews.com/ This test is not yet approved or cleared by the [place] FDA and  has been authorized for detection and/or diagnosis of SARS-CoV-2 by FDA under an Emergency Use Authorization (EUA). This EUA will remain  in effect (meaning this test can be used) for the duration of the COVID-19 declaration under Section 56 4(b)(1) of the Act, 21 U.S.C. section 360bbb-3(b)(1), unless the authorization is terminated or revoked sooner. Performed at [facility], [place] [address] [place], [place] [address]       Studies: No results found.  Scheduled Meds: . chlordiazePOXIDE  5 mg Oral QID  . feeding supplement (ENSURE ENLIVE)  237 mL Oral BID BM  . feeding supplement (PRO-STAT SUGAR FREE 64)  30 mL Oral BID  . folic acid  1 mg Oral Daily  . LORazepam  0-4 mg Intravenous Q12H  . losartan  100 mg Oral Daily  . multivitamin with minerals  1 tablet Oral Daily  . sodium chloride flush  3 mL Intravenous Once  . thiamine  100 mg Oral Daily   Or  . thiamine  100 mg Intravenous Daily    Continuous Infusions: . dextrose 5% lactated ringers 75 mL/hr at 10/27/19 0700  . sodium phosphate  Dextrose 5% IVPB 20 mmol (10/27/19 1333)     LOS: 3 days     Kayleen Memos, MD [facility] Pager [phone]  If 7PM-7AM, please contact night-coverage [email or web address] Password [email or web address] 10/27/2019, 2:22 PM

## 2019-10-27 NOTE — Progress Notes (Signed)
Message sent via Amion requesting renewal of restraint order, patient is a danger to self with multiple attempts to get out of bed, does not verbally redirect, and is on CIWA protocol.

## 2019-10-27 NOTE — Progress Notes (Signed)
[facility] Progress Note  CC:  EtOH hepatitis   Subjective: He is awake, calm. He denies having any abdominal pain. Nurse at bed side to assist him with breakfast.   Objective:  Vital signs in last 24 hours: Temp:  [97.3 F (36.3 C)-99 F (37.2 C)] 98.7 F (37.1 C) (02/07 0447) Pulse Rate:  [84-107] 95 (02/07 0447) Resp:  [14-20] 18 (02/07 0447) BP: (121-141)/(75-91) 128/85 (02/07 0447) SpO2:  [93 %-96 %] 96 % (02/07 0447) Last BM Date: 10/23/19 General:  Awake, alert, calm, in NAD.  Eyes: + Scleral icterus. Heart: RRR, no murmur.  Pulm:  Breath sounds clear, diminished in the bases.  Abdomen: Soft, nontender, + BS x 4 quadrants. No HSM.  Extremities:  Without edema. Neurologic:  Alert and oriented x 2. Speech is clear. Moves all extremities. UE restrains briefly removed for asterixis exam, upper extremities slightly tremulous without asterixis. RN called to bed side to re-secure wrist restraints.  Psych:  Alert and cooperative.   Intake/Output from previous day: 02/06 0701 - 02/07 0700 In: 3005.1 [P.O.:1210; I.V.:1545; IV Piggyback:250] Out: 850 [Urine:850] Intake/Output this shift: No intake/output data recorded.  Lab Results: Recent Labs    10/26/19 0606 10/26/19 1834 10/27/19 0343  WBC 10.2 10.1 10.1  HGB 10.4* 9.8* 9.7*  HCT 30.2* 29.8* 28.7*  PLT 130* 131* 133*   BMET Recent Labs    10/26/19 0606 10/26/19 1834 10/27/19 0343  NA 137 137 138  K 3.4* 3.4* 3.6  CL 100 103 104  CO2 22 25 28   GLUCOSE 114* 170* 136*  BUN 16 17 16   CREATININE 0.76 0.77 0.79  CALCIUM 8.2* 8.5* 8.6*   LFT Recent Labs    10/26/19 0606 10/26/19 1834 10/27/19 0343  PROT 6.2*   < > 6.0*  ALBUMIN 2.3*   < > 2.2*  AST 104*   < > 91*  ALT 30   < > 29  ALKPHOS 113   < > 113  BILITOT 10.3*   < > 9.1*  BILIDIR 5.2*  --   --   IBILI 5.1*  --   --    < > = values in this interval not displayed.   PT/INR Recent Labs    10/26/19 0606 10/27/19 0343   LABPROT 17.6* 17.2*  INR 1.5* 1.4*   Hepatitis Panel No results for input(s): HEPBSAG, HCVAB, HEPAIGM, HEPBIGM in the last 72 hours.  No results found.  Assessment / Plan:  76. 74 year old male with a history of alcohol abuse with elevated LFTs. AST/ALT ratio consistent with EtOH hepatitis. CT abdomen pelvis 2/3 identifieda fatty liver, gallbladder wall thickening without evidence of stones or acute cholecystitis. T. Bili down 9.1 from 10.2. Alk Phos 113. AST 91. ALT29. Discriminant function 27 down from 30.1. He does not meet the criteria for Prednisolone. -Repeat hepatic panel and INR in am  2. Encephalopathic, secondary to EtOH hepatitis +  Active Etoh withdrawal. Ammonia 27. WBC 10.2. He is afebrile. No evidence of sepsis. Soft restrains in use.  -CIWA protocol initiated, avoid over sedation -Monitor neurostatus closely  3. Syncope of unclear etiology.Noncritical bilateral ICA stenosis per Dopplers.He remains hemodynamically stable.Monitor showed 20 beat run of Vtach 2/6 at 5pm.  -Consider cardiac evaluation as inpatient, defer to the hospitalist service.  3. Mild coagulopathy. INR 1.4. -avoid IM injections   4. Chronic macrocytic anemia. Hg drifting downward. Today Hg 9.7. HCT 28.7. No obvious signs of GI bleeding. -Recommend EGD to be done at  the time of his colonoscopy,be scheduled at his office appointment with Dr. Fuller Plan on 11/28/2019.He will require an earlier procedure date if he demonstrates evidence of active GI bleeding.  5. Thrombocytopenia. Platelet count 133.   6. History of adenomatous colon polyps in 2010 and 2015. Past due for surveillance colonoscopy. Patient has appointment with Dr. Fuller Plan on 11/28/2019 to schedule a colonoscopy. Recommend EGD to be done at the time of his colonoscopy if not done during this hospitalization.   7. Malnutrition. Albumin 2.2.  -Plan for Coretrack for tube feeding if his nutritional status does not improve by the  end of the day  Further recommendation per Dr. Rush Landmark     Principal Problem:   Syncope Active Problems:   Essential hypertension   Abnormal liver function   Alcohol abuse   Hyponatremia   Protein-calorie malnutrition, severe (HCC)   Malnutrition of moderate degree     LOS: 3 days   Patrecia Pour Kennedy-Smith  10/27/2019, 9:09 AM

## 2019-10-28 DIAGNOSIS — E43 Unspecified severe protein-calorie malnutrition: Secondary | ICD-10-CM

## 2019-10-28 DIAGNOSIS — R410 Disorientation, unspecified: Secondary | ICD-10-CM

## 2019-10-28 DIAGNOSIS — R17 Unspecified jaundice: Secondary | ICD-10-CM

## 2019-10-28 LAB — CBC
HCT: 27.3 % — ABNORMAL LOW (ref 39.0–52.0)
Hemoglobin: 9.2 g/dL — ABNORMAL LOW (ref 13.0–17.0)
MCH: 36.2 pg — ABNORMAL HIGH (ref 26.0–34.0)
MCHC: 33.7 g/dL (ref 30.0–36.0)
MCV: 107.5 fL — ABNORMAL HIGH (ref 80.0–100.0)
Platelets: 127 10*3/uL — ABNORMAL LOW (ref 150–400)
RBC: 2.54 MIL/uL — ABNORMAL LOW (ref 4.22–5.81)
RDW: 18 % — ABNORMAL HIGH (ref 11.5–15.5)
WBC: 10.3 10*3/uL (ref 4.0–10.5)
nRBC: 0 % (ref 0.0–0.2)

## 2019-10-28 LAB — COMPREHENSIVE METABOLIC PANEL
ALT: 27 U/L (ref 0–44)
AST: 85 U/L — ABNORMAL HIGH (ref 15–41)
Albumin: 2 g/dL — ABNORMAL LOW (ref 3.5–5.0)
Alkaline Phosphatase: 106 U/L (ref 38–126)
Anion gap: 6 (ref 5–15)
BUN: 14 mg/dL (ref 8–23)
CO2: 25 mmol/L (ref 22–32)
Calcium: 8.1 mg/dL — ABNORMAL LOW (ref 8.9–10.3)
Chloride: 104 mmol/L (ref 98–111)
Creatinine, Ser: 0.73 mg/dL (ref 0.61–1.24)
GFR calc Af Amer: >60 mL/min (ref >60)
GFR calc non Af Amer: >60 mL/min (ref >60)
Glucose, Bld: 125 mg/dL — ABNORMAL HIGH (ref 70–99)
Potassium: 3.3 mmol/L — ABNORMAL LOW (ref 3.5–5.1)
Sodium: 135 mmol/L (ref 135–145)
Total Bilirubin: 6.8 mg/dL — ABNORMAL HIGH (ref 0.3–1.2)
Total Protein: 5.6 g/dL — ABNORMAL LOW (ref 6.5–8.1)

## 2019-10-28 LAB — GLUCOSE, CAPILLARY
Glucose-Capillary: 105 mg/dL — ABNORMAL HIGH (ref 70–99)
Glucose-Capillary: 119 mg/dL — ABNORMAL HIGH (ref 70–99)
Glucose-Capillary: 120 mg/dL — ABNORMAL HIGH (ref 70–99)
Glucose-Capillary: 129 mg/dL — ABNORMAL HIGH (ref 70–99)
Glucose-Capillary: 141 mg/dL — ABNORMAL HIGH (ref 70–99)

## 2019-10-28 LAB — PROTIME-INR
INR: 1.5 — ABNORMAL HIGH (ref 0.8–1.2)
Prothrombin Time: 18 seconds — ABNORMAL HIGH (ref 11.4–15.2)

## 2019-10-28 MED ORDER — POTASSIUM CHLORIDE 10 MEQ/100ML IV SOLN
10.0000 meq | INTRAVENOUS | Status: AC
  Administered 2019-10-28 (×3): 10 meq via INTRAVENOUS
  Filled 2019-10-28 (×4): qty 100

## 2019-10-28 MED ORDER — LACTATED RINGERS IV BOLUS
500.0000 mL | Freq: Once | INTRAVENOUS | Status: AC
  Administered 2019-10-28: 500 mL via INTRAVENOUS

## 2019-10-28 MED ORDER — POTASSIUM CHLORIDE 10 MEQ/100ML IV SOLN: 10.0000 meq | INTRAVENOUS | Status: DC

## 2019-10-28 MED ORDER — POTASSIUM CHLORIDE 10 MEQ/100ML IV SOLN
10.0000 meq | Freq: Once | INTRAVENOUS | Status: AC
  Administered 2019-10-28: 10 meq via INTRAVENOUS

## 2019-10-28 NOTE — Progress Notes (Addendum)
Pt for lunch ate a medium sized bowl of vegetable soups with crackers and drank a carton of milk.

## 2019-10-28 NOTE — Progress Notes (Signed)
Physical Therapy Treatment Patient Details Name: Richard Irwin MRN: [id number] DOB: [birth year] Today's Date: 10/28/2019    History of Present Illness Pt is 74 yo  male with PMH including HTN, ETOH abuse, anemia, diverticulosis, and h/o GI bleeding, and COPD.  Pt was standing at home and had episode of syncope.  Admitted with syncope with ongoing workup.    PT Comments    Pt easily awoken but unclear where he was. General Comments: pt was unable to tell me "where" he was.  Responded "prison then church"  Assisted to EOB.  General bed mobility comments: light assist with upper body to complete scooting to EOB.  Then MD came in.  Assisted with amb.   General Gait Details: unsteady "drunken" gait with limited distance due to weakness and overall feeling "bad".   Recliner brought to pt and positioned to comfort.  Assisted pt with phone to call his wife as he was trying to use the nurses call light.  Then he could not recall her number.  Looked in [place] and assisted with call.    Follow Up Recommendations  No PT follow up     Equipment Recommendations  None recommended by PT    Recommendations for Other Services       Precautions / Restrictions Precautions Precautions: Fall Restrictions Weight Bearing Restrictions: No    Mobility  Bed Mobility Overal bed mobility: Needs Assistance Bed Mobility: Supine to Sit     Supine to sit: Supervision     General bed mobility comments: light assist with upper body to complete scooting to EOB  Transfers Overall transfer level: Needs assistance Equipment used: Rolling walker (2 wheeled) Transfers: Sit to/from Stand Sit to Stand: Min assist         General transfer comment: cues for proper hand placement, shaky, unsteady  Ambulation/Gait Ambulation/Gait assistance: Min assist Gait Distance (Feet): 85 Feet Assistive device: Rolling walker (2 wheeled);None Gait Pattern/deviations: Step-through pattern;Staggering right;Staggering  left;Shuffle;Ataxic Gait velocity: decreased   General Gait Details: unsteady "drunken" gait with limited distance due to weakness and overall feeling "bad".   Stairs             Wheelchair Mobility    Modified Rankin (Stroke Patients Only)       Balance                                            Cognition Arousal/Alertness: Awake/alert   Overall Cognitive Status: Impaired/Different from baseline Area of Impairment: Awareness                               General Comments: pt was unable to tell me "where" he was.  Responded "prison then church"      Exercises      General Comments        Pertinent Vitals/Pain Pain Assessment: No/denies pain    Home Living                      Prior Function            PT Goals (current goals can now be found in the care plan section) Progress towards PT goals: Progressing toward goals    Frequency    Min 3X/week      PT Plan Current plan remains appropriate  Co-evaluation              AM-PAC PT "6 Clicks" Mobility   Outcome Measure    Help needed moving from lying on your back to sitting on the side of a flat bed without using bedrails?: A Little Help needed moving to and from a bed to a chair (including a wheelchair)?: A Little Help needed standing up from a chair using your arms (e.g., wheelchair or bedside chair)?: A Little Help needed to walk in hospital room?: A Little Help needed climbing 3-5 steps with a railing? : A Lot 6 Click Score: 14    End of Session Equipment Utilized During Treatment: Gait belt Activity Tolerance: Patient limited by fatigue Patient left: in chair;with call bell/phone within reach;with chair alarm set Nurse Communication: Mobility status PT Visit Diagnosis: Unsteadiness on feet (R26.81)     Time: 2010-0712 PT Time Calculation (min) (ACUTE ONLY): 25 min  Charges:  $Gait Training: 8-22 mins $Therapeutic Activity: 8-22  mins                     Rica Koyanagi  PTA Acute  Rehabilitation Services Pager      [phone] Office      [phone]

## 2019-10-28 NOTE — Progress Notes (Addendum)
  Progress Note   Subjective  Chief Complaint: Alcoholic hepatitis  This morning patient is awake, he is about to get up and be ambulated with occupational therapy/PT.  Per them he was not oriented to place until they helped him out, he does know his name and birthday and what day it is.  He denies any complaints or concerns.  Per nursing patient just has "not had an appetite".  They tried to feed him some for breakfast but he said he was not hungry.  They are going to try again at lunchtime.    Objective   Vital signs in last 24 hours: Temp:  [98.6 F (37 C)-100.6 F (38.1 C)] 98.6 F (37 C) (02/08 0617) Pulse Rate:  [96] 96 (02/08 0617) Resp:  [18] 18 (02/08 0617) BP: (117-132)/(78-87) 117/78 (02/08 0617) SpO2:  [93 %-95 %] 95 % (02/08 0617) Weight:  [77.4 kg] 77.4 kg (02/08 0500) Last BM Date: 10/23/19 General:    white male in NAD, +icterus Heart:  Regular rate and rhythm; no murmurs Lungs: Respirations even and unlabored, lungs CTA bilaterally Abdomen:  Soft, nontender and nondistended. Normal bowel sounds. Extremities:  Without edema. Neurologic:  Alert and orientedx2,  grossly normal neurologically, no asterixis Psych:  Cooperative. Normal mood and affect.  Intake/Output from previous day: 02/07 0701 - 02/08 0700 In: 2296.9 [P.O.:240; I.V.:1381.5; IV Piggyback:675.4] Out: 600 [Urine:600]  Lab Results: Recent Labs    10/26/19 1834 10/27/19 0343 10/28/19 0353  WBC 10.1 10.1 10.3  HGB 9.8* 9.7* 9.2*  HCT 29.8* 28.7* 27.3*  PLT 131* 133* 127*   BMET Recent Labs    10/26/19 1834 10/27/19 0343 10/28/19 0353  NA 137 138 135  K 3.4* 3.6 3.3*  CL 103 104 104  CO2 25 28 25  GLUCOSE 170* 136* 125*  BUN 17 16 14  CREATININE 0.77 0.79 0.73  CALCIUM 8.5* 8.6* 8.1*   LFT Recent Labs    10/26/19 0606 10/26/19 1834 10/28/19 0353  PROT 6.2*   < > 5.6*  ALBUMIN 2.3*   < > 2.0*  AST 104*   < > 85*  ALT 30   < > 27  ALKPHOS 113   < > 106  BILITOT 10.3*    < > 6.8*  BILIDIR 5.2*  --   --   IBILI 5.1*  --   --    < > = values in this interval not displayed.   PT/INR Recent Labs    10/27/19 0343 10/28/19 0353  LABPROT 17.2* 18.0*  INR 1.4* 1.5*     Assessment / Plan:   Assessment: 1.  Alcoholic hepatitis: History of alcohol abuse with elevated LFTs, AST/ALT ratio consistent with alcoholic hepatitis, CT abdomen pelvis 2/3 identified a fatty liver, gallbladder wall thickening without evidence of stones or acute cholecystitis, T bili down from 9.126.8 today, alk phos 106, AST 85, ALT 27, does not meet criteria for prednisolone 2.  Encephalopathy: Secondary to above, plus alcohol withdrawal, improving 3.  Syncope of unclear etiology: Monitor apparently showed 20 beat run of V. tach 2/6 at 5 PM 4.  Mild coagulopathy: INR 1.5 5.  Chronic macrocytic anemia: Hemoglobin drifting downward, no obvious signs of GI bleed; our team has previously recommended EGD to be scheduled at time of colonoscopy as an outpatient 6.  Thrombocytopenia: Platelets 127 7.  History of adenomatous colon polyps: 1 colons in 2010 and 2015, past due for surveillance, has an appoint with Dr. Stark 11/28/2019 to schedule 8.    Malnutrition  Plan: 1.  Continue to monitor hepatic panel and INR 2.  Our team has previously discussed the patient has an appointment with Dr. Fuller Plan scheduled 11/28/2019 as an outpatient.  At that time would recommend that he have his colonoscopy and EGD scheduled. 3.  Per nursing patient seems uninterested in eating, they are going to try again at lunch.  Our team as previously discussed tube feeding.  We will leave decision to Dr. Loletha Carrow later today. 4.  Please await further recommendations from Dr. Loletha Carrow.  Thank you for your kind consultation, we will continue to follow.    LOS: 4 days   Levin Erp  10/28/2019, 11:00 AM   I have discussed the case with the PA, and that is the plan I formulated. I personally interviewed and examined the  patient.  His acute alcoholic hepatitis is slowly improving.  Bilirubin coming down, but he will be jaundiced for the next couple of weeks.  Coagulopathy is stable, his mental status seems to be slowly improving, but not back to baseline yet.  Renal function normal. Severe protein calorie malnutrition.  I would not place a feeding tube at this point, as I expect his mental status to slowly but steadily improved.  He needs to be strongly encouraged to eat and assisted with meals.  Recommendations given, care plan initiated, GI will sign off but please call if need arises.  Total time 25 minutes. Nelida Meuse III Office: [phone]

## 2019-10-28 NOTE — Progress Notes (Addendum)
PROGRESS NOTE  Richard Irwin FYB:[id number] DOB: 12/12/1945 DOA: 10/23/2019 PCP: Lawerance Cruel, MD  HPI/Recap of past 24 hours: Richard Irwin  is a 74 y.o. male, w hypertension, Etoh abuse,  anemia, diverticulosis, h/o GI bleeding,  Copd, was at the bar standing at home when had syncope, per wife his eyes rolled back. No tongue laceration, no incontinence. No tonic clonic movement.  Pt states lasted a few sec.  Ended up on floor,  Wife called EMS.    On presentation had elevated AST and significantly elevated T bilirubin, peaked at 10.2.  Hospital course complicated by acute alcohol withdrawal.  On CIWA protocol with Librium.  10/28/19: Seen and examined.  Mentation improving.  Denies any pain nausea or discomfort, wants to go home.   Assessment/Plan: Principal Problem:   Syncope Active Problems:   Essential hypertension   Abnormal liver function   Alcohol abuse   Hyponatremia   Protein-calorie malnutrition, severe (HCC)   Malnutrition of moderate degree   Syncope, possibly cardiac etiology Work-up unrevealing thus far.  Would benefit from Holter monitor or loop recorder. 2D echo no LVH.  Abnormal septal motion with inferior basal hypokinesis.  LVEF 50 to 55%.  There was moderate to severe dilatation of the aortic root measuring 4.7 cm.  Consider follow-up CTA chest to size EEG unremarkable No evidence of significant carotid stenosis on bilateral Doppler ultrasound MRI brain, patient declines 20 beat run of V. Tach 2/6 at 5PM OT>> Home health OT with 24 h supervision and assistance Continue fall precautions  Isolated fever with Tmax 100.6 No leukocytosis Denies any discomfort, no cough, no abd pain, no nausea no increase urinary frequency or dysuria. Suspect 2/2 to atelectasis Advised to use IS and flutter valve Continue to closely monitor.  Acute metabolic encephalopathy likely alcohol withdrawal On 10/26/2019: agitated, tachycardia, disoriented Reorient as  needed Continue CIWA protocol, continue Librium. Mentation is improved . C/w IV fluid D5 LR due to poor oral intake. Feeding assistance as needed.  Alcohol abuse with concern for alcohol withdrawal Presented intoxicated with elevated alcohol level on 10/23/2019. Likely withdrawing from alcohol Continue CIWA protocol Alcohol cessation counseling at bedside.  Hyperbilirubinemia/alcoholic hepatitis No evidence of gallstones on right upper quadrant ultrasound T. Bilirubin 3.8 trending up 9.4>> 10.4>>9.1>> 6.9 Planned outpatient f/u with GI Dr. Fuller Plan in March 2021, will need EGD and colonoscopy.  Coagulopathy in the setting of alcohol liver disease Mild coagulopathy with INR between 1.5>> 1.4>> 1.5.  Non sustained V-tach Would benefit from loop recorder after discharge to further assess Cardiology contacted to arrange  Hypokalemia Potassium 3.3 Repleted with IV potassium Repeat level  Acute drop in Hg/plt suspect bone marrow suppression in the setting of chronic alcohol use Denies melena or hematochezia Hemoglobin 9.2 from 9.7 from 9.8. No sign of overt bleeding.  History of adenomatous colon polyps in 2010 in 2015 Colonoscopy planned with GI [place] Dr. Fuller Plan outpatient on 11/28/2019.  Essential hypertension Blood pressure stable Continue losartan  ?Depression/chronic alcohol abuse TOC consulted to provide resources.  Ambulatory dysfunction OT recommended home health OT Continue fall precautions   Code Status: Full code  Family Communication: None at bedside  Disposition Plan: Patient is from home.  Anticipate discharge to home once GI signs off.  Barrier to discharge ongoing work-up and treatment for alcohol withdrawal.   Consultants:  GI  Procedures:  EEG  Antimicrobials:  None  DVT prophylaxis: SCDs   Objective: Vitals:   10/28/19 0500 10/28/19 0617 10/28/19 0800 10/28/19  1136  BP:  117/78 115/72 121/80  Pulse:  96 88 93  Resp:  18  18  Temp:   98.6 F (37 C)  97.7 F (36.5 C)  TempSrc:  Oral  Oral  SpO2:  95%  98%  Weight: 77.4 kg     Height:        Intake/Output Summary (Last 24 hours) at 10/28/2019 1433 Last data filed at 10/28/2019 5449 Gross per 24 hour  Intake 1375.04 ml  Output 600 ml  Net 775.04 ml   Filed Weights   10/24/19 0500 10/25/19 0640 10/28/19 0500  Weight: 75.5 kg 73.4 kg 77.4 kg    Exam:  . General: 74 y.o. year-old male well-developed well-nourished no acute distress.  Alert oriented x3.  Mentation improved. . Cardiovascular: Regular rate and rhythm no rubs or gallops.   Marland Kitchen Respiratory: Clear to auscultation no wheezes no RALES.   Marland Kitchen Abdomen: Soft nontender normal bowel sounds present musculoskeletal: No lower extremity edema bilaterally.   Psychiatry: Mood is appropriate for condition and setting.  Data Reviewed: CBC: Recent Labs  Lab 10/25/19 0455 10/26/19 0606 10/26/19 1834 10/27/19 0343 10/28/19 0353  WBC 8.4 10.2 10.1 10.1 10.3  HGB 9.9* 10.4* 9.8* 9.7* 9.2*  HCT 28.7* 30.2* 29.8* 28.7* 27.3*  MCV 106.3* 107.5* 108.4* 107.1* 107.5*  PLT 137* 130* 131* 133* 201*   Basic Metabolic Panel: Recent Labs  Lab 10/25/19 0252 10/26/19 0606 10/26/19 1834 10/27/19 0343 10/28/19 0353  NA 137 137 137 138 135  K 4.2 3.4* 3.4* 3.6 3.3*  CL 102 100 103 104 104  CO2 30 22 25 28 25   GLUCOSE 113* 114* 170* 136* 125*  BUN 17 16 17 16 14   CREATININE 0.93 0.76 0.77 0.79 0.73  CALCIUM 8.6* 8.2* 8.5* 8.6* 8.1*  MG  --   --  1.5* 1.7  --   PHOS  --   --  2.3* 2.8  --    GFR: Estimated Creatinine Clearance: 87.6 mL/min (by C-G formula based on SCr of 0.73 mg/dL). Liver Function Tests: Recent Labs  Lab 10/25/19 0252 10/26/19 0606 10/26/19 1834 10/27/19 0343 10/28/19 0353  AST 116* 104* 96* 91* 85*  ALT 31 30 27 29 27   ALKPHOS 131* 113 108 113 106  BILITOT 9.4* 10.3* 10.2* 9.1* 6.8*  PROT 6.2* 6.2* 5.9* 6.0* 5.6*  ALBUMIN 2.4* 2.3* 2.3* 2.2* 2.0*   No results for input(s): LIPASE,  AMYLASE in the last 168 hours. Recent Labs  Lab 10/23/19 2157 10/26/19 0721  AMMONIA 38* 27   Coagulation Profile: Recent Labs  Lab 10/23/19 2010 10/25/19 0252 10/26/19 0606 10/27/19 0343 10/28/19 0353  INR 1.3* 1.4* 1.5* 1.4* 1.5*   Cardiac Enzymes: No results for input(s): CKTOTAL, CKMB, CKMBINDEX, TROPONINI in the last 168 hours. BNP (last 3 results) No results for input(s): PROBNP in the last 8760 hours. HbA1C: No results for input(s): HGBA1C in the last 72 hours. CBG: Recent Labs  Lab 10/27/19 1144 10/27/19 1852 10/28/19 0028 10/28/19 0612 10/28/19 1133  GLUCAP 127* 144* 129* 120* 105*   Lipid Profile: No results for input(s): CHOL, HDL, LDLCALC, TRIG, CHOLHDL, LDLDIRECT in the last 72 hours. Thyroid Function Tests: No results for input(s): TSH, T4TOTAL, FREET4, T3FREE, THYROIDAB in the last 72 hours. Anemia Panel: No results for input(s): VITAMINB12, FOLATE, FERRITIN, TIBC, IRON, RETICCTPCT in the last 72 hours. Urine analysis:    Component Value Date/Time   COLORURINE AMBER (A) 10/26/2019 1728   APPEARANCEUR CLEAR 10/26/2019 1728  LABSPEC 1.018 10/26/2019 1728   PHURINE 7.0 10/26/2019 1728   GLUCOSEU NEGATIVE 10/26/2019 1728   HGBUR LARGE (A) 10/26/2019 1728   BILIRUBINUR MODERATE (A) 10/26/2019 1728   KETONESUR NEGATIVE 10/26/2019 1728   PROTEINUR NEGATIVE 10/26/2019 1728   UROBILINOGEN 0.2 08/29/2008 2215   NITRITE NEGATIVE 10/26/2019 1728   LEUKOCYTESUR NEGATIVE 10/26/2019 1728   Sepsis Labs: @LABRCNTIP (procalcitonin:4,lacticidven:4)  ) Recent Results (from the past 240 hour(s))  SARS CORONAVIRUS 2 (TAT 6-24 HRS) Nasopharyngeal Nasopharyngeal Swab     Status: None   Collection Time: 10/23/19  9:57 PM   Specimen: Nasopharyngeal Swab  Result Value Ref Range Status   SARS Coronavirus 2 NEGATIVE NEGATIVE Final    Comment: (NOTE) SARS-CoV-2 target nucleic acids are NOT DETECTED. The SARS-CoV-2 RNA is generally detectable in upper and  lower respiratory specimens during the acute phase of infection. Negative results do not preclude SARS-CoV-2 infection, do not rule out co-infections with other pathogens, and should not be used as the sole basis for treatment or other patient management decisions. Negative results must be combined with clinical observations, patient history, and epidemiological information. The expected result is Negative. Fact Sheet for Patients: SugarRoll.be Fact Sheet for Healthcare Providers: https://www.woods-mathews.com/ This test is not yet approved or cleared by the [place] FDA and  has been authorized for detection and/or diagnosis of SARS-CoV-2 by FDA under an Emergency Use Authorization (EUA). This EUA will remain  in effect (meaning this test can be used) for the duration of the COVID-19 declaration under Section 56 4(b)(1) of the Act, 21 U.S.C. section 360bbb-3(b)(1), unless the authorization is terminated or revoked sooner. Performed at [facility], [place] [address], [place], [place] [address]       Studies: No results found.  Scheduled Meds: . chlordiazePOXIDE  5 mg Oral QID  . feeding supplement (ENSURE ENLIVE)  237 mL Oral BID BM  . feeding supplement (PRO-STAT SUGAR FREE 64)  30 mL Oral BID  . folic acid  1 mg Oral Daily  . losartan  100 mg Oral Daily  . multivitamin with minerals  1 tablet Oral Daily  . sodium chloride flush  3 mL Intravenous Once  . thiamine  100 mg Oral Daily   Or  . thiamine  100 mg Intravenous Daily    Continuous Infusions: . dextrose 5% lactated ringers 75 mL/hr at 10/28/19 0217     LOS: 4 days     Kayleen Memos, MD [facility] Pager [phone]  If 7PM-7AM, please contact night-coverage [email or web address] Password [facility] 10/28/2019, 2:33 PM

## 2019-10-28 NOTE — Progress Notes (Signed)
Updated the patient's wife Janie via phone.  All questions answered.

## 2019-10-28 NOTE — TOC Progression Note (Signed)
Transition of Care [facility]) - Progression Note    Patient Details  Name: Richard Irwin MRN: [id number] Date of Birth: [birth year]  Transition of Care [facility]) CM/SW Contact  Purcell Mouton, RN Phone Number: 10/28/2019, 3:45 PM  Clinical Narrative:     Spoke with pt's wife Dina Mobley [phone] concerning Alcohol Abuse and Detox.  Referred wife to use the [place] and Alcoholics Anonymous for assistance.        Expected Discharge Plan and Services                                                 Social Determinants of Health (SDOH) Interventions    Readmission Risk Interventions No flowsheet data found.

## 2019-10-28 NOTE — Progress Notes (Signed)
  Discussed with Dr. Lovena Le.   Would not plan for loop at this time with syncope in the setting of intoxication and alcoholic hepatitis.   NSVT noted in setting of hypokalemia. (3.4 on 2/6; 3.3 again today, supp ordered) Mg 1.7 on 2/7. Would supp to Goal K>3.9 and Mg>1.9.  EF WNL (50-55%) by Echo 10/24/19.  Recommended he be discharged on beta blocker.   Per Dr. Lovena Le, I have scheduled the patient with an appointment in several weeks to further explore syncope and discuss the possibility of a loop recorder/monitoring at that time.   Dr. [place] Crane has asked for formal cardiology input; Discussed with Dr. [place] Crane and [place], General cardiology will see in the am for further.   EP follow up placed in chart. Please call with further questions.   Legrand Como [address] [place], [place]  10/28/2019 3:43 PM

## 2019-10-29 ENCOUNTER — Ambulatory Visit: Payer: No Typology Code available for payment source

## 2019-10-29 LAB — COMPREHENSIVE METABOLIC PANEL
ALT: 26 U/L (ref 0–44)
AST: 80 U/L — ABNORMAL HIGH (ref 15–41)
Albumin: 2 g/dL — ABNORMAL LOW (ref 3.5–5.0)
Alkaline Phosphatase: 104 U/L (ref 38–126)
Anion gap: 5 (ref 5–15)
BUN: 13 mg/dL (ref 8–23)
CO2: 25 mmol/L (ref 22–32)
Calcium: 8.1 mg/dL — ABNORMAL LOW (ref 8.9–10.3)
Chloride: 104 mmol/L (ref 98–111)
Creatinine, Ser: 0.65 mg/dL (ref 0.61–1.24)
GFR calc Af Amer: >60 mL/min (ref >60)
GFR calc non Af Amer: >60 mL/min (ref >60)
Glucose, Bld: 123 mg/dL — ABNORMAL HIGH (ref 70–99)
Potassium: 3.7 mmol/L (ref 3.5–5.1)
Sodium: 134 mmol/L — ABNORMAL LOW (ref 135–145)
Total Bilirubin: 6.5 mg/dL — ABNORMAL HIGH (ref 0.3–1.2)
Total Protein: 5.7 g/dL — ABNORMAL LOW (ref 6.5–8.1)

## 2019-10-29 LAB — CBC
HCT: 28.1 % — ABNORMAL LOW (ref 39.0–52.0)
Hemoglobin: 9.5 g/dL — ABNORMAL LOW (ref 13.0–17.0)
MCH: 36.5 pg — ABNORMAL HIGH (ref 26.0–34.0)
MCHC: 33.8 g/dL (ref 30.0–36.0)
MCV: 108.1 fL — ABNORMAL HIGH (ref 80.0–100.0)
Platelets: 134 10*3/uL — ABNORMAL LOW (ref 150–400)
RBC: 2.6 MIL/uL — ABNORMAL LOW (ref 4.22–5.81)
RDW: 18.2 % — ABNORMAL HIGH (ref 11.5–15.5)
WBC: 11.2 10*3/uL — ABNORMAL HIGH (ref 4.0–10.5)
nRBC: 0 % (ref 0.0–0.2)

## 2019-10-29 LAB — MAGNESIUM: Magnesium: 1.3 mg/dL — ABNORMAL LOW (ref 1.7–2.4)

## 2019-10-29 LAB — GLUCOSE, CAPILLARY
Glucose-Capillary: 106 mg/dL — ABNORMAL HIGH (ref 70–99)
Glucose-Capillary: 131 mg/dL — ABNORMAL HIGH (ref 70–99)

## 2019-10-29 MED ORDER — POTASSIUM CHLORIDE CRYS ER 20 MEQ PO TBCR
40.0000 meq | EXTENDED_RELEASE_TABLET | Freq: Once | ORAL | Status: AC
  Administered 2019-10-29: 06:00:00 40 meq via ORAL
  Filled 2019-10-29: qty 2

## 2019-10-29 MED ORDER — MAGNESIUM SULFATE 4 GM/100ML IV SOLN
4.0000 g | Freq: Once | INTRAVENOUS | Status: AC
  Administered 2019-10-29: 4 g via INTRAVENOUS
  Filled 2019-10-29: qty 100

## 2019-10-29 MED ORDER — METOPROLOL TARTRATE 25 MG PO TABS
12.5000 mg | ORAL_TABLET | Freq: Two times a day (BID) | ORAL | Status: DC
  Administered 2019-10-29: 12.5 mg via ORAL
  Filled 2019-10-29 (×2): qty 1

## 2019-10-29 MED ORDER — ADULT MULTIVITAMIN W/MINERALS CH: 1.0000 | ORAL_TABLET | Freq: Every day | ORAL | 0 refills | Status: AC

## 2019-10-29 MED ORDER — CHLORDIAZEPOXIDE HCL 5 MG PO CAPS: 5.0000 mg | ORAL_CAPSULE | Freq: Three times a day (TID) | ORAL | 0 refills | Status: AC | PRN

## 2019-10-29 MED ORDER — CHLORDIAZEPOXIDE HCL 5 MG PO CAPS: 5.0000 mg | ORAL_CAPSULE | Freq: Three times a day (TID) | ORAL | Status: DC | PRN

## 2019-10-29 MED ORDER — MAGNESIUM OXIDE 400 (241.3 MG) MG PO TABS: 400.0000 mg | ORAL_TABLET | Freq: Every day | ORAL | Status: DC

## 2019-10-29 MED ORDER — THIAMINE HCL 100 MG PO TABS: 100.0000 mg | ORAL_TABLET | Freq: Every day | ORAL | 0 refills | Status: AC

## 2019-10-29 MED ORDER — METOPROLOL TARTRATE 25 MG PO TABS: 12.5000 mg | ORAL_TABLET | Freq: Two times a day (BID) | ORAL | 0 refills | Status: DC

## 2019-10-29 MED ORDER — FOLIC ACID 1 MG PO TABS: 1.0000 mg | ORAL_TABLET | Freq: Every day | ORAL | 0 refills | Status: AC

## 2019-10-29 MED ORDER — MAGNESIUM OXIDE 400 (241.3 MG) MG PO TABS: 400.0000 mg | ORAL_TABLET | Freq: Every day | ORAL | 0 refills | Status: AC

## 2019-10-29 NOTE — Discharge Summary (Addendum)
Discharge Summary  Richard Irwin KKX:[id number] DOB: 10-Oct-1945  PCP: Richard Cruel, MD  Admit date: 10/23/2019 Discharge date: 10/29/2019  Time spent: 35 minutes  Recommendations for Outpatient Follow-up:  1. Follow-up with cardiology/electrophysiology 2. Follow-up with GI 3. Follow-up with your primary care provider 4. Take your medications as prescribed  Discharge Diagnoses:  Active Hospital Problems   Diagnosis Date Noted  . Syncope 10/23/2019  . Malnutrition of moderate degree 10/25/2019  . Abnormal liver function 10/23/2019  . Alcohol abuse 10/23/2019  . Hyponatremia 10/23/2019  . Protein-calorie malnutrition, severe ([facility]) 10/23/2019  . Essential hypertension 09/12/2007    Resolved Hospital Problems  No resolved problems to display.    Discharge Condition: Stable  Diet recommendation: Heart healthy diet.  Vitals:   10/29/19 0537 10/29/19 1143  BP: 125/78 110/71  Pulse: 91 83  Resp: 18 14  Temp: 98.9 F (37.2 C) 98.2 F (36.8 C)  SpO2: 99% 97%    History of present illness:  DonaldWilliamsis a74 y.o.male,w hypertension, Etoh abuse, chronic macrocytic anemia, diverticulosis, h/o GI bleeding, Copd, was at the bar standing at home when had syncope,per wife his eyes rolled back. No tongue laceration, no incontinence. No tonic clonic movement.Wife called EMS.  Not lethargic.  On presentation had elevated AST and significantly elevated T bilirubin, peaked at 10.2.  Hospital course complicated by acute alcohol withdrawal with agitation.  Improved on CIWA protocol with Librium.  Also complicated by an episode of nonsustained V. tach.  Discussed with cardiology.  Patient will follow up with Dr. Lovena Le electrophysiology outpatient for possible loop recorder.  10/29/19: Seen and examined.  No acute events overnight.  He has no new complaints.  He denies abdominal pain, nausea, chest pain, dyspnea or palpitations.  He wants to go home.    Hospital Course:   Principal Problem:   Syncope Active Problems:   Essential hypertension   Abnormal liver function   Alcohol abuse   Hyponatremia   Protein-calorie malnutrition, severe ([facility])   Malnutrition of moderate degree  Syncope, possibly cardiac etiology Work-up unrevealing.   No recurrence during this hospitalization Episode of nonsustained V. Tach (20 beat run of V. Tach 2/6 at 5 PM): Would benefit from loop recorder. Discussed with cardiology.  Patient will follow up with Dr. Lovena Le electrophysiology outpatient for possible loop recorder. 2D echo no LVH.  Abnormal septal motion with inferior basal hypokinesis.  LVEF 50 to 55%.  There was moderate to severe dilatation of the aortic root measuring 4.7 cm.  Consider follow-up CTA chest to size.  Follow-up with cardiology. EEG unremarkable No evidence of significant carotid stenosis on bilateral Doppler ultrasound MRI brain, patient declined. OT>> Home health OT with 24 h supervision and assistance Continue fall precautions  Resolved isolated fever with Tmax 100.6 likely secondary to atelectasis Temperature 98.9 on 10/29/2019. No leukocytosis Denies any discomfort, no cough, no abd pain, no nausea no increase urinary frequency or dysuria. Suspect 2/2 to atelectasis Advised to use IS and flutter valve  Resolved acute metabolic encephalopathy likely delirium in the setting of alcohol withdrawal.    He is alert and oriented x3. On 10/26/2019: agitated, tachycardia, disoriented Received CIWA protocol, continue Librium.  Alcohol abuse with concern for alcohol withdrawal Presented intoxicated with elevated alcohol level on 10/23/2019. Alcohol cessation counseling at bedside. Recommend alcoholic and Anonymous to assist with alcohol cessation. Continue multivitamins, folic acid and thiamine.  Hyperbilirubinemia/alcoholic hepatitis No evidence of gallstones on right upper quadrant ultrasound T. Bilirubin 3.8 trending up 9.4>> 10.4>>9.1>>  6.9 Planned outpatient f/u with GI Dr. Fuller Plan in March 2021, will need EGD and colonoscopy. 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 Follow-up with GI patient  Non sustained V-tach Would benefit from loop recorder after discharge to further assess Cardiology contacted to arrange Follow-up with Dr. Lovena Le, electrophysiology  Resolved post repletion: Hypokalemia Potassium 3.3>> 3.7. Repleted.  Hypomagnesemia Magnesium 1.3 Repleted with IV magnesium 4 g once Added magnesium oxide 400 mg daily x3 days. Follow-up with your PCP  Acute drop in Hg/plt suspect bone marrow suppression in the setting of alcohol use disorder Denies melena or hematochezia Hemoglobin stable at 9.5 and slightly trending up from 9.2. No sign of overt bleeding. Follow-up with your PCP or with GI  History of adenomatous colon polyps in 2010 in 2015 Colonoscopy planned with [facility] Dr. Fuller Plan outpatient on 11/28/2019. Follow-up with GI  Essential hypertension Blood pressure stable Continue losartan Metoprolol 12.5 mg twice daily added, cardiology/electrophysiology recommended starting a beta-blocker.  ?Depression/chronic alcohol abuse TOC consulted to provide resources.  Ambulatory dysfunction OT recommended home health OT Continue fall precautions  Moderate protein calorie malnutrition likely in the setting of chronic alcohol abuse Albumin 2.0 Encourage increase protein calorie oral intake  Code Status: Full code   Consultants:  GI, cardiology/electrophysiology.  Procedures:  EEG  Antimicrobials:  None     Discharge Exam: BP 110/71 (BP Location: Right Arm)   Pulse 83   Temp 98.2 F (36.8 C) (Oral)   Resp 14   Ht 5\' 11"  (1.803 m)   Wt (!) 172.1 kg   SpO2 97%   BMI 52.92 kg/m  . General: 74 y.o. year-old male well developed well nourished in no acute distress.  Alert  and oriented x3. . Cardiovascular: Regular rate and rhythm with no rubs or gallops.  No thyromegaly or JVD noted.   Marland Kitchen Respiratory: Clear to auscultation with no wheezes or rales. Good inspiratory effort. . Abdomen: Soft nontender nondistended with normal bowel sounds x4 quadrants. . Musculoskeletal: No lower extremity edema. 2/4 pulses in all 4 extremities. Marland Kitchen Psychiatry: Mood is appropriate for condition and setting  Discharge Instructions You were cared for by a hospitalist during your hospital stay. If you have any questions about your discharge medications or the care you received while you were in the hospital after you are discharged, you can call the unit and asked to speak with the hospitalist on call if the hospitalist that took care of you is not available. Once you are discharged, your primary care physician will handle any further medical issues. Please note that NO REFILLS for any discharge medications will be authorized once you are discharged, as it is imperative that you return to your primary care physician (or establish a relationship with a primary care physician if you do not have one) for your aftercare needs so that they can reassess your need for medications and monitor your lab values.   Allergies as of 10/29/2019      Reactions   Lisinopril Cough      Medication List    STOP taking these medications   ferrous sulfate 325 (65 FE) MG EC tablet     TAKE these medications   chlordiazePOXIDE 5 MG capsule Commonly known as: LIBRIUM Take 1 capsule (5 mg total) by mouth 3 (three) times daily as needed for up to 7 days for anxiety or withdrawal.   folic acid 1 MG tablet  Commonly known as: FOLVITE Take 1 tablet (1 mg total) by mouth daily. Start taking on: October 30, 2019   losartan 100 MG tablet Commonly known as: COZAAR Take 100 mg by mouth daily.   magnesium oxide 400 (241.3 Mg) MG tablet Commonly known as: MAG-OX Take 1 tablet (400 mg total) by mouth daily for  3 days.   metoprolol tartrate 25 MG tablet Commonly known as: LOPRESSOR Take 0.5 tablets (12.5 mg total) by mouth 2 (two) times daily.   multivitamin with minerals Tabs tablet Take 1 tablet by mouth daily. Start taking on: October 30, 2019   thiamine 100 MG tablet Take 1 tablet (100 mg total) by mouth daily. Start taking on: October 30, 2019      Allergies  Allergen Reactions  . Lisinopril Cough   Follow-up Information    Evans Lance, MD Follow up on 11/15/2019.   Specialty: Cardiology Why: at 0900 am for consultation for syncope and NSVT.  Contact information: [address] [place] [address] [phone]        Richard Cruel, MD. Call in 1 day(s).   Specialty: Family Medicine Why: Please call for a post hospital follow-up appointment. Contact information: [address] [place] [address] [phone]        Ladene Artist, MD. Call in 1 day(s).   Specialty: Gastroenterology Why: Please call for a post hospital follow-up appointment. Contact information: [address] [place] [address] [phone]        [name], MD. Call in 1 day(s).   Specialty: Cardiology Why: Please call for a post hospital follow up appointment. Contact information: [address] [place] [address] [phone]            The results of significant diagnostics from this hospitalization (including imaging, microbiology, ancillary and laboratory) are listed below for reference.    Significant Diagnostic Studies: EEG  Result Date: 10/24/2019 Lora Havens, MD     10/24/2019  2:24 PM Patient Name: VADIM CENTOLA MRN: [id number] Epilepsy Attending: Lora Havens Referring Physician/Provider: Dr. Jani Gravel Date: 10/24/2019 Duration: 24.01 minutes Patient history: 74 year old male with history of alcohol abuse who presented after syncopal episode.  EEG to evaluate for seizures. Level of alertness: Awake  AEDs during EEG study: Lorazepam Technical aspects: This EEG study was done with scalp electrodes positioned according to the 10-20 International system of electrode placement. Electrical activity was acquired at a sampling rate of 500Hz  and reviewed with a high frequency filter of 70Hz  and a low frequency filter of 1Hz . EEG data were recorded continuously and digitally stored. Description: The posterior dominant rhythm consists of 8-9 Hz activity of moderate voltage (25-35 uV) seen predominantly in posterior head regions, symmetric and reactive to eye opening and eye closing. Hyperventilation and photic stimulation were not performed. IMPRESSION: This study is within normal limits. No seizures or epileptiform discharges were seen throughout the recording. Lora Havens   CT Head Wo Contrast  Result Date: 10/23/2019 CLINICAL DATA:  Head trauma after syncopal episode EXAM: CT HEAD WITHOUT CONTRAST TECHNIQUE: Contiguous axial images were obtained from the base of the skull through the vertex without intravenous contrast. COMPARISON:  August 29, 2008 FINDINGS: Brain: No evidence of acute territorial infarction, hemorrhage, hydrocephalus,extra-axial collection or mass lesion/mass effect. There is dilatation the ventricles and sulci consistent with age-related atrophy. Low-attenuation changes in the deep white matter consistent with small vessel ischemia. Vascular: No hyperdense vessel or  unexpected calcification. Skull: The skull is intact. No fracture or focal lesion identified. Sinuses/Orbits: The visualized paranasal sinuses and mastoid air cells are clear. The orbits and globes intact. Other: None IMPRESSION: No acute intracranial abnormality. Findings consistent with age related atrophy and chronic small vessel ischemia Electronically Signed   By: Prudencio Pair M.D.   On: 10/23/2019 21:08   CT Angio Chest PE W/Cm &/Or Wo Cm  Result Date: 10/23/2019 CLINICAL DATA:  Follow-up thoracic aneurysm, history of  syncopal episode today EXAM: CT ANGIOGRAPHY CHEST WITH CONTRAST TECHNIQUE: Multidetector CT imaging of the chest was performed using the standard protocol during bolus administration of intravenous contrast. Multiplanar CT image reconstructions and MIPs were obtained to evaluate the vascular anatomy. CONTRAST:  133mL OMNIPAQUE IOHEXOL 350 MG/ML SOLN COMPARISON:  05/03/2013 FINDINGS: Cardiovascular: Thoracic aorta again demonstrates atherosclerotic calcifications. Considerable motion artifact somewhat limits evaluation of the ascending aorta. Dilatation is noted similar to that seen on the prior exam primarily at the level of the aortic root and sino-tubular junction. The ascending aorta more distally measures 4 cm in greatest dimension. No evidence of dissection is seen. Normal tapering in the aortic arch and descending aorta are seen. No cardiac enlargement is seen. The pulmonary artery shows a normal branching pattern although no definitive filling defect to suggest pulmonary embolism is seen. Coronary calcifications are noted. No pericardial effusion is seen. Mediastinum/Nodes: Thoracic inlet is within normal limits. No sizable hilar or mediastinal adenopathy is noted. The esophagus as visualized is within normal limits. Lungs/Pleura: The lungs are well aerated bilaterally. Mild left basilar atelectasis is seen. No focal confluent infiltrate is noted. No sizable effusion or parenchymal nodules are seen. Upper Abdomen: Visualized upper abdomen shows fatty infiltration of the liver. No other focal abnormality is noted. Elevation of the left hemidiaphragm is seen. Musculoskeletal: Degenerative changes of the thoracic spine are noted. No acute bony abnormality is seen. Review of the MIP images confirms the above findings. IMPRESSION: No evidence of pulmonary emboli. Overall stable dilatation of the ascending aorta when compared with the prior exam although some cardiac motion artifact is noted precluding adequate  measurements of the aorta. No dissection is seen. Recommend annual imaging followup by CTA or MRA. This recommendation follows 2010 ACCF/AHA/AATS/ACR/ASA/SCA/SCAI/SIR/STS/SVM Guidelines for the Diagnosis and Management of Patients with Thoracic Aortic Disease. Circulation. 2010; 121: U932-T557. Aortic aneurysm NOS (ICD10-I71.9) Mild left basilar atelectasis. Fatty infiltration of the liver. Aortic Atherosclerosis (ICD10-I70.0). Aortic aneurysm NOS (ICD10-I71.9). Electronically Signed   By: Inez Catalina M.D.   On: 10/23/2019 21:14   CT Abdomen Pelvis W Contrast  Result Date: 10/23/2019 CLINICAL DATA:  Jaundice and syncopal episode EXAM: CT ABDOMEN AND PELVIS WITH CONTRAST TECHNIQUE: Multidetector CT imaging of the abdomen and pelvis was performed using the standard protocol following bolus administration of intravenous contrast. CONTRAST:  139mL OMNIPAQUE IOHEXOL 350 MG/ML SOLN COMPARISON:  None. FINDINGS: Lower chest: There is mild cardiomegaly. Dense coronary artery calcifications are seen. There is elevation of the left hemidiaphragm. The visualized portions of the lungs are clear. Hepatobiliary: There is diffuse low density seen throughout the liver parenchyma. No focal hepatic lesion is seen.The main portal vein is patent. There is hyperenhancement of the gallbladder wall with a small amount of mesenteric fat stranding changes. No calcified gallstones however are noted. Pancreas: Unremarkable. No pancreatic ductal dilatation or surrounding inflammatory changes. Spleen: Normal in size without focal abnormality. Adrenals/Urinary Tract: Both adrenal glands appear normal. The kidneys and collecting system appear normal without evidence of urinary tract  calculus or hydronephrosis. Bladder is unremarkable. Stomach/Bowel: The stomach and small bowel are normal in size and contour. There small foci of air seen adjacent to the anterior liver which are thought to be in decompressed hepatic flexure. The remainder of the  colon is unremarkable. Vascular/Lymphatic: There are no enlarged mesenteric, retroperitoneal, or pelvic lymph nodes. Scattered aortic atherosclerotic calcifications are seen without aneurysmal dilatation. Reproductive: The prostate is unremarkable. Other: No evidence of abdominal wall mass or hernia. Musculoskeletal: No acute or significant osseous findings. Degenerative changes are seen throughout the thoracolumbar spine. Endplate sclerosis and cystic changes most notable at L3-L4. IMPRESSION: 1. Hyperenhancement of the gallbladder wall with mild surrounding inflammatory changes, however no definite calcified gallstones are seen. This could be due to early acute acalculous cholecystitis. If further evaluation is required would recommend right upper quadrant ultrasound. 2. Diverticulosis without diverticulitis. 3.  Aortic Atherosclerosis (ICD10-I70.0). 4. Hepatic steatosis 5. Dense coronary artery calcifications. Electronically Signed   By: Prudencio Pair M.D.   On: 10/23/2019 21:15   ECHOCARDIOGRAM COMPLETE  Result Date: 10/24/2019   ECHOCARDIOGRAM REPORT   Patient Name:   MICHAEL WALRATH Date of Exam: 10/24/2019 Medical Rec #:  [id number]         Height:       71.0 in Accession #:    [id number]        Weight:       166.4 lb Date of Birth:  [birth year]         BSA:          1.95 m Patient Age:    40 years          BP:           114/78 mmHg Patient Gender: M                 HR:           110 bpm. Exam Location:  Inpatient Procedure: 2D Echo, Cardiac Doppler and Color Doppler Indications:    Syncope  History:        Patient has prior history of Echocardiogram examinations, most                 recent 03/31/2013. COPD; Risk Factors:Hypertension. ETOH abuse,                 GI bleed, abnormal liver function.  Sonographer:    Dustin Flock Referring Phys: [place]  1. Left ventricular ejection fraction, by visual estimation, is 50 to 55%. The left ventricle has low normal function. There is no left  ventricular hypertrophy.  2. Left ventricular diastolic parameters are indeterminate.  3. The left ventricle demonstrates regional wall motion abnormalities.  4. Abnormal septal motion with inferior basal hypokinesis.  5. Global right ventricle has normal systolic function.The right ventricular size is normal. No increase in right ventricular wall thickness.  6. Left atrial size was normal.  7. Right atrial size was normal.  8. Mild mitral annular calcification.  9. The mitral valve is normal in structure. No evidence of mitral valve regurgitation. No evidence of mitral stenosis. 10. The tricuspid valve is normal in structure. 11. The tricuspid valve is normal in structure. Tricuspid valve regurgitation is not demonstrated. 12. The aortic valve is normal in structure. Aortic valve regurgitation is not visualized. No evidence of aortic valve sclerosis or stenosis. 13. The pulmonic valve was normal in structure. Pulmonic valve regurgitation is not visualized. 14. Aortic dilatation noted. 15. There is  moderate to severe dilatation of the aortic root measuring 47 mm. 16. Consider f/u CTA chest to size aorta. 17. The inferior vena cava is normal in size with greater than 50% respiratory variability, suggesting right atrial pressure of 3 mmHg. 18. Limited apical views obtained and no subcostal views. 19. The interatrial septum was not assessed. FINDINGS  Left Ventricle: Left ventricular ejection fraction, by visual estimation, is 50 to 55%. The left ventricle has low normal function. The left ventricle demonstrates regional wall motion abnormalities. There is no left ventricular hypertrophy. Left ventricular diastolic parameters are indeterminate. Normal left atrial pressure. Abnormal septal motion with inferior basal hypokinesis. Right Ventricle: The right ventricular size is normal. No increase in right ventricular wall thickness. Global RV systolic function is has normal systolic function. Left Atrium: Left atrial size  was normal in size. Right Atrium: Right atrial size was normal in size Pericardium: There is no evidence of pericardial effusion. Mitral Valve: The mitral valve is normal in structure. There is mild thickening of the mitral valve leaflet(s). There is mild calcification of the mitral valve leaflet(s). Mild mitral annular calcification. No evidence of mitral valve regurgitation. No evidence of mitral valve stenosis by observation. Tricuspid Valve: The tricuspid valve is normal in structure. Tricuspid valve regurgitation is not demonstrated. Aortic Valve: The aortic valve is normal in structure. Aortic valve regurgitation is not visualized. The aortic valve is structurally normal, with no evidence of sclerosis or stenosis. Pulmonic Valve: The pulmonic valve was normal in structure. Pulmonic valve regurgitation is not visualized. Pulmonic regurgitation is not visualized. Aorta: The aortic root, ascending aorta and aortic arch are all structurally normal, with no evidence of dilitation or obstruction and aortic dilatation noted. There is moderate to severe dilatation of the aortic root measuring 47 mm. Consider f/u CTA chest to size aorta. Venous: The inferior vena cava is normal in size with greater than 50% respiratory variability, suggesting right atrial pressure of 3 mmHg. IAS/Shunts: The interatrial septum was not assessed. There is no evidence of a patent foramen ovale. No ventricular septal defect is seen or detected. There is no evidence of an atrial septal defect. Additional Comments: Limited apical views obtained and no subcostal views.  Jenkins Rouge MD Electronically signed by Jenkins Rouge MD Signature Date/Time: 10/24/2019/11:40:26 AM    Final    VAS US CAROTID  Result Date: 10/24/2019 Carotid Arterial Duplex Study Indications:       Syncope. Risk Factors:      Hypertension. Comparison Study:  No prior study Performing Technologist: Maudry Mayhew MHA, RDMS, RVT, RDCS  Examination Guidelines: A complete  evaluation includes B-mode imaging, spectral Doppler, color Doppler, and power Doppler as needed of all accessible portions of each vessel. Bilateral testing is considered an integral part of a complete examination. Limited examinations for reoccurring indications may be performed as noted.  Right Carotid Findings: +----------+--------+--------+--------+-----------------------+--------+           PSV cm/sEDV cm/sStenosisPlaque Description     Comments +----------+--------+--------+--------+-----------------------+--------+ CCA Prox  78      18              smooth and heterogenous         +----------+--------+--------+--------+-----------------------+--------+ CCA Distal66      20                                              +----------+--------+--------+--------+-----------------------+--------+  ICA Prox  93      24              calcific                        +----------+--------+--------+--------+-----------------------+--------+ ICA Distal140     36                                              +----------+--------+--------+--------+-----------------------+--------+ ECA       120     16              calcific                        +----------+--------+--------+--------+-----------------------+--------+ +----------+--------+-------+----------------+-------------------+           PSV cm/sEDV cmsDescribe        Arm Pressure (mmHG) +----------+--------+-------+----------------+-------------------+ [id number]             Multiphasic, WNL                    +----------+--------+-------+----------------+-------------------+ +---------+--------+--+--------+-+---------+ VertebralPSV cm/s17EDV cm/s6Antegrade +---------+--------+--+--------+-+---------+  Left Carotid Findings: +----------+-------+-------+--------+---------------------------------+--------+           PSV    EDV    StenosisPlaque Description               Comments           cm/s   cm/s                                                      +----------+-------+-------+--------+---------------------------------+--------+ CCA Prox  101    23                                                       +----------+-------+-------+--------+---------------------------------+--------+ CCA Distal122    30             irregular and heterogenous                +----------+-------+-------+--------+---------------------------------+--------+ ICA Prox  52     16             irregular, heterogenous and                                               calcific                                  +----------+-------+-------+--------+---------------------------------+--------+ ICA Distal70     23             calcific                                  +----------+-------+-------+--------+---------------------------------+--------+ ECA       92     19                                                       +----------+-------+-------+--------+---------------------------------+--------+ +----------+--------+--------+----------------+-------------------+  PSV cm/sEDV cm/sDescribe        Arm Pressure (mmHG) +----------+--------+--------+----------------+-------------------+ [id number]             Multiphasic, WNL                    +----------+--------+--------+----------------+-------------------+ +---------+--------+--+--------+--+---------+ VertebralPSV cm/s40EDV cm/s13Antegrade +---------+--------+--+--------+--+---------+   Summary: Right Carotid: Velocities in the right ICA are consistent with a 1-39% stenosis. Left Carotid: Velocities in the left ICA are consistent with a 1-39% stenosis. Vertebrals:  Bilateral vertebral arteries demonstrate antegrade flow. Subclavians: Normal flow hemodynamics were seen in bilateral subclavian              arteries. *See table(s) above for measurements and observations.  Electronically signed by Antony Contras MD on 10/24/2019  at 12:40:33 PM.    Final    US Abdomen Limited RUQ  Result Date: 10/25/2019 CLINICAL DATA:  Acalculous cholecystitis EXAM: ULTRASOUND ABDOMEN LIMITED RIGHT UPPER QUADRANT COMPARISON:  None FINDINGS: Gallbladder: Mild thickening of gallbladder wall. No gallstones, pericholecystic fluid or sonographic Murphy sign. Common bile duct: Diameter: 5 mm diameter, normal Liver: Echogenic parenchyma, likely fatty infiltration though this can be seen with cirrhosis and certain infiltrative disorders. No focal hepatic mass or nodularity are identified, though assessment of intrahepatic detail is suboptimal due to sound attenuation. Portal vein is patent on color Doppler imaging with normal direction of blood flow towards the liver. Other: No RIGHT upper quadrant free fluid. IMPRESSION: Mild gallbladder wall thickening without additional gallbladder findings, nonspecific. If there is persistent clinical concern for acalculous cholecystitis, consider follow-up radionuclide hepatobiliary imaging. Probable fatty infiltration of liver as above. Electronically Signed   By: Lavonia Dana M.D.   On: 10/25/2019 08:28    Microbiology: Recent Results (from the past 240 hour(s))  SARS CORONAVIRUS 2 (TAT 6-24 HRS) Nasopharyngeal Nasopharyngeal Swab     Status: None   Collection Time: 10/23/19  9:57 PM   Specimen: Nasopharyngeal Swab  Result Value Ref Range Status   SARS Coronavirus 2 NEGATIVE NEGATIVE Final    Comment: (NOTE) SARS-CoV-2 target nucleic acids are NOT DETECTED. The SARS-CoV-2 RNA is generally detectable in upper and lower respiratory specimens during the acute phase of infection. Negative results do not preclude SARS-CoV-2 infection, do not rule out co-infections with other pathogens, and should not be used as the sole basis for treatment or other patient management decisions. Negative results must be combined with clinical observations, patient history, and epidemiological information. The expected result  is Negative. Fact Sheet for Patients: SugarRoll.be Fact Sheet for Healthcare Providers: https://www.woods-mathews.com/ This test is not yet approved or cleared by the [place] FDA and  has been authorized for detection and/or diagnosis of SARS-CoV-2 by FDA under an Emergency Use Authorization (EUA). This EUA will remain  in effect (meaning this test can be used) for the duration of the COVID-19 declaration under Section 56 4(b)(1) of the Act, 21 U.S.C. section 360bbb-3(b)(1), unless the authorization is terminated or revoked sooner. Performed at [facility], [place] [address], [place], [place] [address]      Labs: Basic Metabolic Panel: Recent Labs  Lab 10/26/19 0606 10/26/19 1834 10/27/19 0343 10/28/19 0353 10/29/19 0304  NA 137 137 138 135 134*  K 3.4* 3.4* 3.6 3.3* 3.7  CL 100 103 104 104 104  CO2 22 25 28 25 25   GLUCOSE 114* 170* 136* 125* 123*  BUN 16 17 16 14 13   CREATININE 0.76 0.77 0.79 0.73 0.65  CALCIUM 8.2* 8.5* 8.6* 8.1* 8.1*  MG  --  1.5* 1.7  --  1.3*  PHOS  --  2.3* 2.8  --   --    Liver Function Tests: Recent Labs  Lab 10/26/19 0606 10/26/19 1834 10/27/19 0343 10/28/19 0353 10/29/19 0304  AST 104* 96* 91* 85* 80*  ALT 30 27 29 27 26   ALKPHOS 113 108 113 106 104  BILITOT 10.3* 10.2* 9.1* 6.8* 6.5*  PROT 6.2* 5.9* 6.0* 5.6* 5.7*  ALBUMIN 2.3* 2.3* 2.2* 2.0* 2.0*   No results for input(s): LIPASE, AMYLASE in the last 168 hours. Recent Labs  Lab 10/23/19 2157 10/26/19 0721  AMMONIA 38* 27   CBC: Recent Labs  Lab 10/26/19 0606 10/26/19 1834 10/27/19 0343 10/28/19 0353 10/29/19 0304  WBC 10.2 10.1 10.1 10.3 11.2*  HGB 10.4* 9.8* 9.7* 9.2* 9.5*  HCT 30.2* 29.8* 28.7* 27.3* 28.1*  MCV 107.5* 108.4* 107.1* 107.5* 108.1*  PLT 130* 131* 133* 127* 134*   Cardiac Enzymes: No results for input(s): CKTOTAL, CKMB, CKMBINDEX, TROPONINI in the last 168 hours. BNP: BNP (last 3 results) No  results for input(s): BNP in the last 8760 hours.  ProBNP (last 3 results) No results for input(s): PROBNP in the last 8760 hours.  CBG: Recent Labs  Lab 10/28/19 1133 10/28/19 1721 10/28/19 2311 10/29/19 0534 10/29/19 1137  GLUCAP 105* 141* 119* 106* 131*       Signed:  Kayleen Memos, MD [facility] 10/29/2019, 11:43 AM

## 2019-10-29 NOTE — Discharge Instructions (Addendum)
Bilirubin Test Why am I having this test? The bilirubin test is used to evaluate liver function. A health care provider may recommend this test:  If you have hemolytic anemia.  For a newborn who has jaundice. What is being tested?  This test measures the level of bilirubin in the body. Bilirubin is produced when red blood cells are broken down. Normally, bilirubin is broken down in the liver and excreted as a component of bile. However, when red blood cells are broken down more quickly than usual, or when there is a dysfunction in how bile is excreted, bilirubin levels can become raised (elevated). In newborns with jaundice, elevated bilirubin levels may put the child at risk for brain damage. What kind of sample is taken? This test can be performed using one of the following methods:  Blood sample. This is usually collected by inserting a needle into a blood vessel.  Urine sample. This is collected using a germ-free (sterile) container that is given to you by the lab. How do I prepare for this test? Fasting requirements for this test may vary among different labs. You may be asked not to eat or drink anything except water after midnight on the night before the test. How are the results reported? Your test results will be reported as values. Your health care provider will compare your results to normal ranges that were established after testing a large group of people (reference ranges). Reference ranges may vary among labs and hospitals. For this test, common reference ranges are:  Blood samples ? Newborn total bilirubin: 1-12 mg/dL or 17.1-205 micromoles/L (SI units). ? Adult, elderly, or child:  Total bilirubin: 0.3-1 mg/dL or 5.1-17 micromoles/L (SI units).  Indirect bilirubin: 0.2-0.8 mg/dL or 3.4-12 micromoles/L (SI units).  Direct bilirubin: 0.1-0.3 mg/dL or 1.7-5.1 micromoles/L (SI units).  Urine samples ? 0-0.02 mg/dL or 0-0.34 micromoles/L (SI units). What do the results  mean? Results that are greater than the reference ranges may indicate:  Gallstones.  Obstruction of the bile ducts.  Certain tumors of the liver.  Disorders that affect the breakdown and excretion of bilirubin.  Disorders that cause the destruction of red blood cells.  Liver diseases.  Reaction to certain medicines.  Reaction to blood transfusion. Talk with your health care provider about what your results mean. Questions to ask your health care provider Ask your health care provider, or the department that is doing the test:  When will my results be ready?  How will I get my results?  What are my treatment options?  What other tests do I need?  What are my next steps? Summary  The bilirubin test is used to evaluate liver function.  Bilirubin is produced when red blood cells are broken down.  When red blood cells are broken down more quickly than usual, or when there is a dysfunction in how bile is excreted, bilirubin levels can become elevated.  Results that are greater than the reference ranges may indicate a number of diseases. This information is not intended to replace advice given to you by your health care provider. Make sure you discuss any questions you have with your health care provider. Document Revised: 04/25/2018 Document Reviewed: 05/08/2017 Elsevier Patient Education  [place]. Alcohol Abuse and Dependence Information, Adult Alcohol is a widely available drug. People drink alcohol in different amounts. People who drink alcohol very often and in large amounts often have problems during and after drinking. They may develop what is called an alcohol use  disorder. There are two main types of alcohol use disorders:  Alcohol abuse. This is when you use alcohol too much or too often. You may use alcohol to make yourself feel happy or to reduce stress. You may have a hard time setting a limit on the amount you drink.  Alcohol dependence. This is when  you use alcohol consistently for a period of time, and your body changes as a result. This can make it hard to stop drinking because you may start to feel sick or feel different when you do not use alcohol. These symptoms are known as withdrawal. How can alcohol abuse and dependence affect me? Alcohol abuse and dependence can have a negative effect on your life. Drinking too much can lead to addiction. You may feel like you need alcohol to function normally. You may drink alcohol before work in the morning, during the day, or as soon as you get home from work in the evening. These actions can result in:  Poor work performance.  Job loss.  Financial problems.  Car crashes or criminal charges from driving after drinking alcohol.  Problems in your relationships with friends and family.  Losing the trust and respect of coworkers, friends, and family. Drinking heavily over a long period of time can permanently damage your body and brain, and can cause lifelong health issues, such as:  Damage to your liver or pancreas.  Heart problems, high blood pressure, or stroke.  Certain cancers.  Decreased ability to fight infections.  Brain or nerve damage.  Depression.  Early (premature) death. If you are careless or you crave alcohol, it is easy to drink more than your body can handle (overdose). Alcohol overdose is a serious situation that requires hospitalization. It may lead to permanent injuries or death. What can increase my risk?  Having a family history of alcohol abuse.  Having depression or other mental health conditions.  Beginning to drink at an early age.  Binge drinking often.  Experiencing trauma, stress, and an unstable home life during childhood.  Spending time with people who drink often. What actions can I take to prevent or manage alcohol abuse and dependence?  Do not drink alcohol if: ? Your health care provider tells you not to drink. ? You are pregnant, may be  pregnant, or are planning to become pregnant.  If you drink alcohol: ? Limit how much you use to:  0-1 drink a day for women.  0-2 drinks a day for men. ? Be aware of how much alcohol is in your drink. In the [place] one drink equals one 12 oz bottle of beer (355 mL), one 5 oz glass of wine (148 mL), or one 1 oz glass of hard liquor (44 mL).  Stop drinking if you have been drinking too much. This can be very hard to do if you are used to abusing alcohol. If you begin to have withdrawal symptoms, talk with your health care provider or a person that you trust. These symptoms may include anxiety, shaky hands, headache, nausea, sweating, or not being able to sleep.  Choose to drink nonalcoholic beverages in social gatherings and places where there may be alcohol. Activity  Spend more time on activities that you enjoy that do not involve alcohol, like hobbies or exercise.  Find healthy ways to cope with stress, such as exercise, meditation, or spending time with people you care about. General information  Talk to your family, coworkers, and friends about supporting you in your efforts  to stop drinking. If they drink, ask them not to drink around you. Spend more time with people who do not drink alcohol.  If you think that you have an alcohol dependency problem: ? Tell friends or family about your concerns. ? Talk with your health care provider or another health professional about where to get help. ? Work with a Transport planner and a Regulatory affairs officer. ? Consider joining a support group for people who struggle with alcohol abuse and dependence. Where to find support   Your health care provider.  SMART Recovery: www.smartrecovery.org Therapy and support groups  Local treatment centers or chemical dependency counselors.  Local AA groups in your community: NicTax.com.pt Where to find more information  Centers for Disease Control and Prevention: http://www.wolf.info/  National Institute on  Alcohol Abuse and Alcoholism: http://www.bradshaw.com/  Alcoholics Anonymous (AA): NicTax.com.pt Contact a health care provider if:  You drank more or for longer than you intended on more than one occasion.  You tried to stop drinking or to cut back on how much you drink, but you were not able to.  You often drink to the point of vomiting or passing out.  You want to drink so badly that you cannot think about anything else.  You have problems in your life due to drinking, but you continue to drink.  You keep drinking even though you feel anxious, depressed, or have experienced memory loss.  You have stopped doing the things you used to enjoy in order to drink.  You have to drink more than you used to in order to get the effect you want.  You experience anxiety, sweating, nausea, shakiness, and trouble sleeping when you try to stop drinking. Get help right away if:  You have thoughts about hurting yourself or others.  You have serious withdrawal symptoms, including: ? Confusion. ? Racing heart. ? High blood pressure. ? Fever. If you ever feel like you may hurt yourself or others, or have thoughts about taking your own life, get help right away. You can go to your nearest emergency department or call:  Your local emergency services (911 in the [place]  A suicide crisis helpline, such as the [facility] at 938-375-5687. This is open 24 hours a day. Summary  Alcohol abuse and dependence can have a negative effect on your life. Drinking too much or too often can lead to addiction.  If you drink alcohol, limit how much you use.  If you are having trouble keeping your drinking under control, find ways to change your behavior. Hobbies, calming activities, exercise, or support groups can help.  If you feel you need help with changing your drinking habits, talk with your health care provider, a good friend, or a therapist, or go to an [place] group. This information is  not intended to replace advice given to you by your health care provider. Make sure you discuss any questions you have with your health care provider. Document Revised: 12/25/2018 Document Reviewed: 11/13/2018 Elsevier Patient Education  [place].   Jaundice, Adult  Jaundice is when the skin, the whites of the eyes, and the lining of the mouth and nose (mucous membranes) turn a yellowish color. It is caused by having too much bilirubin in the blood. Bilirubin is made by the normal breakdown of red blood cells. Having jaundice means that your body's bile system may not be working as it should. The bile system is made up of the liver, the gallbladder, and the bile  ducts. They work together to make, store, and move bile. Jaundice may be caused by drinking too much alcohol. It may also be caused by liver disease, infections, cancers, or some medicines. Your doctor may treat you with medicine, fluids, or surgery. Follow these instructions at home:   Take over-the-counter and prescription medicines only as told by your doctor.  You can use skin lotion to help with itching.  Drink plenty of fluids.  Do not drink alcohol.  Keep all follow-up visits as told by your doctor. This is important. Contact a doctor if:  You have a fever.  You have swelling or pain in your belly (abdomen). Get help right away if:  Your symptoms get worse all of a sudden.  Your pain gets worse.  You keep throwing up (vomiting).  You throw up blood.  You become weak or confused.  You get a very bad headache.  You have blood in your poop.  You lose too much body fluid (dehydration). Signs that have you lost too much body fluid include: ? A very dry mouth. ? A fast, weak pulse. ? Fast breathing. ? Blue lips. Summary  Jaundice is when the skin, the whites of the eyes, and the lining of the mouth and nose turn a yellowish color.  Jaundice may be caused by a problem in the liver, the  gallbladder, and the bile ducts.  Follow your doctor's instructions for home care. These include drinking plenty of fluid, not drinking alcohol, and taking medicines as told.  Get help right away if your symptoms get worse, you feel weak or confused, you throw up, you have blood in your poop, you have a very bad headache, or you lose too much body fluid. This information is not intended to replace advice given to you by your health care provider. Make sure you discuss any questions you have with your health care provider. Document Revised: 09/08/2017 Document Reviewed: 09/08/2017 Elsevier Patient Education  [place].   Syncope Syncope is when you pass out (faint) for a short time. It is caused by a sudden decrease in blood flow to the brain. Signs that you may be about to pass out include:  Feeling dizzy or light-headed.  Feeling sick to your stomach (nauseous).  Seeing all white or all black.  Having cold, clammy skin. If you pass out, get help right away. Call your local emergency services (911 in the [place] Do not drive yourself to the hospital. Follow these instructions at home: Watch for any changes in your symptoms. Take these actions to stay safe and help with your symptoms: Lifestyle  Do not drive, use machinery, or play sports until your doctor says it is okay.  Do not drink alcohol.  Do not use any products that contain nicotine or tobacco, such as cigarettes and e-cigarettes. If you need help quitting, ask your doctor.  Drink enough fluid to keep your pee (urine) pale yellow. General instructions  Take over-the-counter and prescription medicines only as told by your doctor.  If you are taking blood pressure or heart medicine, sit up and stand up slowly. Spend a few minutes getting ready to sit and then stand. This can help you feel less dizzy.  Have someone stay with you until you feel stable.  If you start to feel like you might pass out, lie down right  away and raise (elevate) your feet above the level of your heart. Breathe deeply and steadily. Wait until all of the symptoms are  gone.  Keep all follow-up visits as told by your doctor. This is important. Get help right away if:  You have a very bad headache.  You pass out once or more than once.  You have pain in your chest, belly, or back.  You have a very fast or uneven heartbeat (palpitations).  It hurts to breathe.  You are bleeding from your mouth or your bottom (rectum).  You have black or tarry poop (stool).  You have jerky movements that you cannot control (seizure).  You are confused.  You have trouble walking.  You are very weak.  You have vision problems. These symptoms may be an emergency. Do not wait to see if the symptoms will go away. Get medical help right away. Call your local emergency services (911 in the [place] Do not drive yourself to the hospital. Summary  Syncope is when you pass out (faint) for a short time. It is caused by a sudden decrease in blood flow to the brain.  Signs that you may be about to faint include feeling dizzy, light-headed, or sick to your stomach, seeing all white or all black, or having cold, clammy skin.  If you start to feel like you might pass out, lie down right away and raise (elevate) your feet above the level of your heart. Breathe deeply and steadily. Wait until all of the symptoms are gone. This information is not intended to replace advice given to you by your health care provider. Make sure you discuss any questions you have with your health care provider. Document Revised: 10/18/2017 Document Reviewed: 10/18/2017 Elsevier Patient Education  2020 [employer].

## 2019-10-29 NOTE — Progress Notes (Signed)
Physical Therapy Treatment Patient Details Name: Richard Irwin MRN: [id number] DOB: [birth year] Today's Date: 10/29/2019    History of Present Illness Pt is 74 yo  male with PMH including HTN, ETOH abuse, anemia, diverticulosis, and h/o GI bleeding, and COPD.  Pt was standing at home and had episode of syncope.  Admitted with syncope with ongoing workup.    PT Comments    Ptr was able to recall where he was.  Feeling "a little better but not yet 100%".  Assisted OOB to amb.  General transfer comment: cues for proper hand placement and safety with turns.  [place] use of hands to steady self.  decreased tremors today but still present. General Gait Details: slight improvement but still present with shaky, shuffled gait and limited distance due to c/o fatigue  Follow Up Recommendations  No PT follow up     Equipment Recommendations  None recommended by PT    Recommendations for Other Services       Precautions / Restrictions Precautions Precautions: Fall Restrictions Weight Bearing Restrictions: No    Mobility  Bed Mobility Overal bed mobility: Needs Assistance Bed Mobility: Supine to Sit     Supine to sit: Supervision     General bed mobility comments: self able with increased time  Transfers Overall transfer level: Needs assistance Equipment used: None Transfers: Sit to/from [employer] Transfers Sit to Stand: Supervision Stand pivot transfers: Supervision;Min guard       General transfer comment: cues for proper hand placement and safety with turns.  [place] use of hands to steady self.  decreased tremors today but still present.  Ambulation/Gait Ambulation/Gait assistance: Supervision;Min guard Gait Distance (Feet): 120 Feet Assistive device: IV Pole Gait Pattern/deviations: Step-through pattern;Shuffle;Ataxic Gait velocity: decreased   General Gait Details: slight improvement but still present with shaky, shuffled gait and limited diatnce due to c/o  fatigue   Stairs             Wheelchair Mobility    Modified Rankin (Stroke Patients Only)       Balance                                            Cognition Arousal/Alertness: Awake/alert   Overall Cognitive Status: Within Functional Limits for tasks assessed                                 General Comments: appears at prioe cognition      Exercises      General Comments        Pertinent Vitals/Pain      Home Living                      Prior Function            PT Goals (current goals can now be found in the care plan section) Progress towards PT goals: Progressing toward goals    Frequency    Min 3X/week      PT Plan Current plan remains appropriate    Co-evaluation              AM-PAC PT "6 Clicks" Mobility   Outcome Measure  Help needed turning from your back to your side while in a flat bed without using bedrails?: None Help needed moving  from lying on your back to sitting on the side of a flat bed without using bedrails?: A Little Help needed moving to and from a bed to a chair (including a wheelchair)?: A Little Help needed standing up from a chair using your arms (e.g., wheelchair or bedside chair)?: A Little Help needed to walk in hospital room?: A Little Help needed climbing 3-5 steps with a railing? : A Lot 6 Click Score: 18    End of Session Equipment Utilized During Treatment: Gait belt Activity Tolerance: Patient limited by fatigue Patient left: in chair;with call bell/phone within reach;with chair alarm set Nurse Communication: Mobility status PT Visit Diagnosis: Unsteadiness on feet (R26.81)     Time: 8110-3159 PT Time Calculation (min) (ACUTE ONLY): 25 min  Charges:  $Gait Training: 8-22 mins $Therapeutic Activity: 8-22 mins                     Rica Koyanagi  PTA Acute  Rehabilitation Services Pager      [phone] Office      [phone]

## 2019-10-30 ENCOUNTER — Ambulatory Visit: Payer: Self-pay | Admitting: *Deleted

## 2019-10-30 NOTE — Telephone Encounter (Signed)
Pt was discharged from the hospital yesterday and he can not walk to the bath room by himself. The wife stated he is having diarrhea and wanted to know what he can take that will not interfere with his medications. She has the store brand imodium and asked about giving it with librium. Per reference, there is not interaction between the two medications. She is advised to notify his pharmacist for other interactions. And to call his provider in the morning., she voiced understanding.

## 2019-10-31 ENCOUNTER — Ambulatory Visit: Payer: No Typology Code available for payment source

## 2019-11-03 ENCOUNTER — Ambulatory Visit: Payer: No Typology Code available for payment source | Attending: Internal Medicine

## 2019-11-03 DIAGNOSIS — Z23 Encounter for immunization: Secondary | ICD-10-CM | POA: Insufficient documentation

## 2019-11-03 NOTE — Progress Notes (Signed)
   Covid-19 Vaccination Clinic  Name:  Richard Irwin    MRN: [id number] DOB: 08-25-1946  11/03/2019  Richard Irwin was observed post Covid-19 immunization for 15 minutes without incidence. He was provided with Vaccine Information Sheet and instruction to access the V-Safe system.   Richard Irwin was instructed to call 911 with any severe reactions post vaccine: Marland Kitchen Difficulty breathing  . Swelling of your face and throat  . A fast heartbeat  . A bad rash all over your body  . Dizziness and weakness    Immunizations Administered    Name Date Dose VIS Date Route   Pfizer COVID-19 Vaccine 11/03/2019  2:50 PM 0.3 mL 08/30/2019 Intramuscular   Manufacturer: [place]   Lot: [id number]   [place]: [id number]

## 2019-11-06 ENCOUNTER — Encounter: Payer: Self-pay | Admitting: Nurse Practitioner

## 2019-11-06 ENCOUNTER — Other Ambulatory Visit: Payer: Self-pay

## 2019-11-06 ENCOUNTER — Ambulatory Visit: Payer: Medicare Other | Admitting: Nurse Practitioner

## 2019-11-06 VITALS — BP 100/70 | HR 60 | Temp 93.5°F | Wt 172.0 lb

## 2019-11-06 DIAGNOSIS — Z860100 Personal history of colon polyps, unspecified: Secondary | ICD-10-CM

## 2019-11-06 DIAGNOSIS — D539 Nutritional anemia, unspecified: Secondary | ICD-10-CM | POA: Diagnosis not present

## 2019-11-06 DIAGNOSIS — K7010 Alcoholic hepatitis without ascites: Secondary | ICD-10-CM

## 2019-11-06 MED ORDER — NA SULFATE-K SULFATE-MG SULF 17.5-3.13-1.6 GM/177ML PO SOLN: 1.0000 | Freq: Once | ORAL | 0 refills | Status: AC

## 2019-11-06 NOTE — Patient Instructions (Signed)
If you are age 74 or older, your body mass index should be between 23-30. Your Body mass index is 23.99 kg/m. If this is out of the aforementioned range listed, please consider follow up with your Primary Care Provider.  If you are age 38 or younger, your body mass index should be between 19-25. Your Body mass index is 23.99 kg/m. If this is out of the aformentioned range listed, please consider follow up with your Primary Care Provider.    Your provider has requested that you go to the basement level for lab work before leaving today. Press "B" on the elevator. The lab is located at the first door on the left as you exit the elevator.   You have been scheduled for an endoscopy and colonoscopy. Please follow the written instructions given to you at your visit today. Please pick up your prep supplies at the pharmacy within the next 1-3 days. If you use inhalers (even only as needed), please bring them with you on the day of your procedure.

## 2019-11-06 NOTE — Progress Notes (Signed)
IMPRESSION and PLAN:    74 year old male with PMH significant for but not limited to COPD, lung cancer, hypertension, colon polyps, alcoholism  # Etoh hepatitis --Viral hepatitis studies negative.  --hospitalized earlier in the month with Etoh hepatitis. No findings of cirrhosis on U/S nor CT scan.  Didn't meet criteria for steroids. Here for follow up. No Etoh since discharge. Some confusion in hospital probably from [place] w/d though ammonia was mildly elevated at 38. Mental status is normal today. Some residual confusion at home per wife but improving.  --Overall he feels okay,  just diminished appetite. Doesn't really care for Ensure. Recommended trial of Boost Bid  --Repeat liver tests, INR today.  --Asked him to think about plans he has in place to remain sober going forward. Recommended AA. Wife feels he may be depressed. Seeing PCP soon and plans to discuss. If medication,  such as anti-depressant initiated,  it may need to be at reduced until liver recovers.        # Macrocytic anemia. No overt Gi bleeding -normal B12 and folate. Ferritin 2466 -For further evaluation of anemia patient will be scheduled for EGD / colonoscopy. Will schedule for late March as he is still recovering from Etoh hepatitis. The risks and benefits of colonoscopy with possible polypectomy / biopsies were discussed and the patient agrees to proceed.   # Hx of adenomatous colon polyps.  --Overdue for surveillance colonoscopy. Had post-polypectomy bleed in 2015, gave him reservations about returning but agrees to proceed  # Syncope.   Syncopal episode prior to admission.  Hospital work-up unrevealing.  No recurrences.  For outpatient cardiology follow-up  HPI:    Primary GI: Dr. Fuller Plan  Chief complaint : Hospital follow up on hepatitis   Richard Irwin is a 74 y.o. male with PMH significant for but not limited to COPD, lung cancer, hypertension, colon polyps.  He was hospitalized earlier this  month with syncopal episode, alcoholic hepatitis, Etoh withdrawal, malnutrition    This is my first time meeting the patient, he is accompanied by his wife.  In the hospital he didn't meet criteria for steroids based on discriminant function. Presenting bilirubin was 8.2, it rose to 10.2 before downtrending. By time of discharge total bilirubin was 6.5, transaminases down to AST 90 / normal ALT. He was mildly coagulopathic at 1.5. He hasn't consumed Etoh since hospital discharge. Feels okay but appetite is poor. Doesn't really care for Ensure.   Patient found to have macrocytic anemia in the hospital . B12 and Vit K normal . No overt GI bleeding. He is overdue for polyp surveillance colonoscopy.  He had a post polypectomy bleed following last colonoscopy in October 2015, this is made him a little nervous about returning for repeat exam  Review of systems:     No chest pain, no SOB, no fevers, no urinary sx   Past Medical History:  Diagnosis Date  . Acute blood loss anemia   . Anemia   . Diverticulosis   . Emphysema/COPD ([place])   . H/O: GI bleed   . Hypertension   . Insomnia   . Lung cancer ([place])   . Status post colonoscopy with polypectomy    with bleed  . Thoracic ascending aortic aneurysm ([place]) 05/07/2013  . Tubular adenoma     Patient's surgical history, family medical history, social history, medications and allergies were all reviewed in Epic   Serum creatinine: 0.65 mg/dL 10/29/19 0304  Estimated creatinine clearance: 87.6 mL/min  Current Outpatient Medications  Medication Sig Dispense Refill  . folic acid (FOLVITE) 1 MG tablet Take 1 tablet (1 mg total) by mouth daily. 60 tablet 0  . losartan (COZAAR) 100 MG tablet Take 100 mg by mouth daily.     . metoprolol tartrate (LOPRESSOR) 25 MG tablet Take 0.5 tablets (12.5 mg total) by mouth 2 (two) times daily. 30 tablet 0  . Multiple Vitamin (MULTIVITAMIN WITH MINERALS) TABS tablet Take 1 tablet by mouth daily. 60 tablet 0  .  thiamine 100 MG tablet Take 1 tablet (100 mg total) by mouth daily. 60 tablet 0  . Na Sulfate-K Sulfate-Mg Sulf 17.5-3.13-1.6 GM/177ML SOLN Take 1 kit by mouth once for 1 dose. 354 mL 0   No current facility-administered medications for this visit.    Physical Exam:     BP 100/70   Pulse 60   Temp (!) 93.5 F (34.2 C)   Wt 172 lb (78 kg)   BMI 23.99 kg/m   GENERAL:  Pleasant male in NAD PSYCH: : Cooperative, normal affect EENT:  Icteric sclera CARDIAC:  RRR,  no peripheral edema PULM: Normal respiratory effort, lungs CTA bilaterally, no wheezing ABDOMEN:  Nondistended, soft, nontender. No obvious masses,  normal bowel sounds SKIN:  turgor, no lesions seen Musculoskeletal:  Normal muscle tone, normal strength NEURO: Alert and oriented x 3, no focal neurologic deficits. No asterixis   Tye Savoy , NP 11/06/2019, 1:42 PM

## 2019-11-08 ENCOUNTER — Other Ambulatory Visit (INDEPENDENT_AMBULATORY_CARE_PROVIDER_SITE_OTHER): Payer: Medicare Other

## 2019-11-08 DIAGNOSIS — D539 Nutritional anemia, unspecified: Secondary | ICD-10-CM

## 2019-11-08 DIAGNOSIS — K7010 Alcoholic hepatitis without ascites: Secondary | ICD-10-CM | POA: Diagnosis not present

## 2019-11-08 LAB — HEPATIC FUNCTION PANEL
ALT: 23 U/L (ref 0–53)
AST: 70 U/L — ABNORMAL HIGH (ref 0–37)
Albumin: 2.6 g/dL — ABNORMAL LOW (ref 3.5–5.2)
Alkaline Phosphatase: 124 U/L — ABNORMAL HIGH (ref 39–117)
Bilirubin, Direct: 2.3 mg/dL — ABNORMAL HIGH (ref 0.0–0.3)
Total Bilirubin: 4.2 mg/dL — ABNORMAL HIGH (ref 0.2–1.2)
Total Protein: 6.5 g/dL (ref 6.0–8.3)

## 2019-11-08 LAB — PROTIME-INR
INR: 1.6 ratio — ABNORMAL HIGH (ref 0.8–1.0)
Prothrombin Time: 17.4 s — ABNORMAL HIGH (ref 9.6–13.1)

## 2019-11-08 NOTE — Progress Notes (Signed)
Reviewed and agree with management plan.  Ahnesti Townsend T. Louvinia Cumbo, MD FACG [facility]  

## 2019-11-12 ENCOUNTER — Other Ambulatory Visit: Payer: Self-pay

## 2019-11-14 ENCOUNTER — Telehealth: Payer: Self-pay | Admitting: Gastroenterology

## 2019-11-14 ENCOUNTER — Other Ambulatory Visit: Payer: Self-pay

## 2019-11-14 MED ORDER — PHYTONADIONE 5 MG PO TABS: 10.0000 mg | ORAL_TABLET | Freq: Every day | ORAL | 0 refills | Status: AC

## 2019-11-14 NOTE — Telephone Encounter (Signed)
Vitamin K Rx sent to pharmacy.  Mephyton 5 mg take 2 daily for 3 days.

## 2019-11-15 ENCOUNTER — Other Ambulatory Visit: Payer: Self-pay

## 2019-11-15 ENCOUNTER — Encounter: Payer: Self-pay | Admitting: Internal Medicine

## 2019-11-15 ENCOUNTER — Ambulatory Visit (INDEPENDENT_AMBULATORY_CARE_PROVIDER_SITE_OTHER): Payer: Medicare Other | Admitting: Internal Medicine

## 2019-11-15 VITALS — BP 132/80 | HR 72 | Ht 72.0 in | Wt 174.0 lb

## 2019-11-15 DIAGNOSIS — R55 Syncope and collapse: Secondary | ICD-10-CM | POA: Diagnosis not present

## 2019-11-15 NOTE — Patient Instructions (Signed)
Medication Instructions:  Your physician recommends that you continue on your current medications as directed. Please refer to the Current Medication list given to you today.  Labwork: None ordered.  Testing/Procedures: None ordered.  Follow-Up: Your physician wants you to follow-up in: as needed.   Any Other Special Instructions Will Be Listed Below (If Applicable).  If you need a refill on your cardiac medications before your next appointment, please call your pharmacy.

## 2019-11-15 NOTE — Progress Notes (Signed)
HPI Richard Irwin returns today for followup. He is a pleasant 74 yo man with a h/o ETOH abuse and cirrhosis who was admitted with syncope several weeks ago in the setting of acute alcohol intoxication and liver failure. He was jaundiced. He ultimately stabilized and was discharged home on low dose beta blocker. He was limited by low blood pressure. He has been sober and abstinent. He denies palpitations. He has had non-sustained VT.   Allergies  Allergen Reactions  . Lisinopril Cough     Current Outpatient Medications  Medication Sig Dispense Refill  . folic acid (FOLVITE) 1 MG tablet Take 1 tablet (1 mg total) by mouth daily. 60 tablet 0  . losartan (COZAAR) 100 MG tablet Take 50 mg by mouth daily.    . metoprolol tartrate (LOPRESSOR) 25 MG tablet Take 0.5 tablets (12.5 mg total) by mouth 2 (two) times daily. 30 tablet 0  . Multiple Vitamin (MULTIVITAMIN WITH MINERALS) TABS tablet Take 1 tablet by mouth daily. 60 tablet 0  . phytonadione (MEPHYTON) 5 MG tablet Take 2 tablets (10 mg total) by mouth daily for 3 days. 6 tablet 0  . thiamine 100 MG tablet Take 1 tablet (100 mg total) by mouth daily. 60 tablet 0   No current facility-administered medications for this visit.     Past Medical History:  Diagnosis Date  . Acute blood loss anemia   . Anemia   . Diverticulosis   . Emphysema/COPD ([place])   . H/O: GI bleed   . Hypertension   . Insomnia   . Lung cancer ([place])   . Status post colonoscopy with polypectomy    with bleed  . Thoracic ascending aortic aneurysm ([place]) 05/07/2013  . Tubular adenoma     ROS:   All systems reviewed and negative except as noted in the HPI.   Past Surgical History:  Procedure Laterality Date  . APPENDECTOMY    . COLONOSCOPY WITH PROPOFOL N/A 07/09/2014   Procedure: COLONOSCOPY WITH PROPOFOL;  Surgeon: Irene Shipper, MD;  Location: [facility];  Service: Endoscopy;  Laterality: N/A;  . EXCISIONAL HEMORRHOIDECTOMY    . HERNIA REPAIR    .  ROTATOR CUFF REPAIR       Family History  Problem Relation Age of Onset  . Cirrhosis Father   . Lung cancer Mother   . Colon cancer Neg Hx   . Pancreatic cancer Neg Hx   . Rectal cancer Neg Hx   . Stomach cancer Neg Hx      Social History   Socioeconomic History  . Marital status: Married    Spouse name: Not on file  . Number of children: 1  . Years of education: Not on file  . Highest education level: Not on file  Occupational History  . Occupation: retired Press photographer  Tobacco Use  . Smoking status: Never Smoker  . Smokeless tobacco: Never Used  Substance and Sexual Activity  . Alcohol use: Yes    Comment: beer 2-4 daily  . Drug use: No  . Sexual activity: Yes  Other Topics Concern  . Not on file  Social History Narrative  . Not on file   Social Determinants of Health   Financial Resource Strain:   . Difficulty of Paying Living Expenses: Not on file  Food Insecurity:   . Worried About Charity fundraiser in the Last Year: Not on file  . Ran Out of Food in the Last Year: Not on file  Transportation Needs:   . Film/video editor (Medical): Not on file  . Lack of Transportation (Non-Medical): Not on file  Physical Activity:   . Days of Exercise per Week: Not on file  . Minutes of Exercise per Session: Not on file  Stress:   . Feeling of Stress : Not on file  Social Connections:   . Frequency of Communication with Friends and Family: Not on file  . Frequency of Social Gatherings with Friends and Family: Not on file  . Attends Religious Services: Not on file  . Active Member of Clubs or Organizations: Not on file  . Attends Archivist Meetings: Not on file  . Marital Status: Not on file  Intimate Partner Violence:   . Fear of Current or Ex-Partner: Not on file  . Emotionally Abused: Not on file  . Physically Abused: Not on file  . Sexually Abused: Not on file     BP 132/80   Pulse 72   Ht 6' (1.829 m)   Wt 174 lb (78.9 kg)   SpO2 93%   BMI  23.60 kg/m   Physical Exam:  Well appearing NAD HEENT: Unremarkable Neck:  No JVD, no thyromegally Lymphatics:  No adenopathy Back:  No CVA tenderness Lungs:  Clear with no wheezes HEART:  Regular rate rhythm, no murmurs, no rubs, no clicks Abd:  soft, positive bowel sounds, no organomegally, no rebound, no guarding Ext:  2 plus pulses, no edema, no cyanosis, no clubbing Skin:  No rashes no nodules Neuro:  CN II through XII intact, motor grossly intact  Assess/Plan: 1. NSVT - I think that this was related to ETOH withdrawal and hypercatecholaminergic state. He is asymptomatic. He will continue his beta blocker 2. Syncope - I strongly suspect that this was due to ETOH intoxication. I do not think we need to work this up further unless as has more syncope which would result in the need for an ILR. He will undergo watchful waiting.   Mikle Bosworth.D.

## 2019-11-26 ENCOUNTER — Other Ambulatory Visit: Payer: Self-pay

## 2019-11-26 ENCOUNTER — Ambulatory Visit: Payer: Medicare Other | Attending: Internal Medicine

## 2019-11-26 ENCOUNTER — Other Ambulatory Visit: Payer: Self-pay | Admitting: Nurse Practitioner

## 2019-11-26 ENCOUNTER — Other Ambulatory Visit (INDEPENDENT_AMBULATORY_CARE_PROVIDER_SITE_OTHER): Payer: Medicare Other

## 2019-11-26 DIAGNOSIS — D539 Nutritional anemia, unspecified: Secondary | ICD-10-CM

## 2019-11-26 DIAGNOSIS — E876 Hypokalemia: Secondary | ICD-10-CM

## 2019-11-26 DIAGNOSIS — R0609 Other forms of dyspnea: Secondary | ICD-10-CM

## 2019-11-26 DIAGNOSIS — K7010 Alcoholic hepatitis without ascites: Secondary | ICD-10-CM

## 2019-11-26 DIAGNOSIS — Z23 Encounter for immunization: Secondary | ICD-10-CM

## 2019-11-26 DIAGNOSIS — Z860100 Personal history of colon polyps, unspecified: Secondary | ICD-10-CM

## 2019-11-26 LAB — HEPATIC FUNCTION PANEL
ALT: 20 U/L (ref 0–53)
AST: 54 U/L — ABNORMAL HIGH (ref 0–37)
Albumin: 2.7 g/dL — ABNORMAL LOW (ref 3.5–5.2)
Alkaline Phosphatase: 118 U/L — ABNORMAL HIGH (ref 39–117)
Bilirubin, Direct: 1.3 mg/dL — ABNORMAL HIGH (ref 0.0–0.3)
Total Bilirubin: 2.6 mg/dL — ABNORMAL HIGH (ref 0.2–1.2)
Total Protein: 6.6 g/dL (ref 6.0–8.3)

## 2019-11-26 LAB — PROTIME-INR
INR: 1.4 ratio — ABNORMAL HIGH (ref 0.8–1.0)
Prothrombin Time: 15.6 s — ABNORMAL HIGH (ref 9.6–13.1)

## 2019-11-26 NOTE — Progress Notes (Signed)
   Covid-19 Vaccination Clinic  Name:  Richard Irwin    MRN: [id number] DOB: [birth year]  11/26/2019  [name] was observed post Covid-19 immunization for 15 minutes without incident. He was provided with Vaccine Information Sheet and instruction to access the V-Safe system.   [name] was instructed to call 911 with any severe reactions post vaccine: Marland Kitchen Difficulty breathing  . Swelling of face and throat  . A fast heartbeat  . A bad rash all over body  . Dizziness and weakness   Immunizations Administered    Name Date Dose VIS Date Route   Pfizer COVID-19 Vaccine 11/26/2019  5:26 PM 0.3 mL 08/30/2019 Intramuscular   Manufacturer: [place]   Lot: [id number]   [place]: [id number]

## 2019-11-27 ENCOUNTER — Ambulatory Visit: Payer: No Typology Code available for payment source

## 2019-11-28 ENCOUNTER — Ambulatory Visit: Payer: Self-pay | Admitting: Gastroenterology

## 2019-12-12 ENCOUNTER — Encounter: Payer: Self-pay | Admitting: Gastroenterology

## 2019-12-16 ENCOUNTER — Encounter: Payer: Self-pay | Admitting: Gastroenterology

## 2019-12-16 ENCOUNTER — Other Ambulatory Visit: Payer: Self-pay

## 2019-12-16 ENCOUNTER — Ambulatory Visit (AMBULATORY_SURGERY_CENTER): Payer: Medicare Other | Admitting: Gastroenterology

## 2019-12-16 VITALS — BP 111/65 | HR 74 | Temp 95.3°F | Resp 13 | Wt 172.0 lb

## 2019-12-16 DIAGNOSIS — I8510 Secondary esophageal varices without bleeding: Secondary | ICD-10-CM | POA: Diagnosis not present

## 2019-12-16 DIAGNOSIS — K2970 Gastritis, unspecified, without bleeding: Secondary | ICD-10-CM

## 2019-12-16 DIAGNOSIS — Z86010 Personal history of colonic polyps: Secondary | ICD-10-CM

## 2019-12-16 DIAGNOSIS — K222 Esophageal obstruction: Secondary | ICD-10-CM

## 2019-12-16 DIAGNOSIS — K7010 Alcoholic hepatitis without ascites: Secondary | ICD-10-CM

## 2019-12-16 DIAGNOSIS — K259 Gastric ulcer, unspecified as acute or chronic, without hemorrhage or perforation: Secondary | ICD-10-CM | POA: Diagnosis not present

## 2019-12-16 DIAGNOSIS — D123 Benign neoplasm of transverse colon: Secondary | ICD-10-CM

## 2019-12-16 DIAGNOSIS — D539 Nutritional anemia, unspecified: Secondary | ICD-10-CM

## 2019-12-16 MED ORDER — SODIUM CHLORIDE 0.9 % IV SOLN: 500.0000 mL | Freq: Once | INTRAVENOUS | Status: DC

## 2019-12-16 NOTE — Patient Instructions (Signed)
Handouts given for Barrett's esophagus, Gastritis, polyps, high fiber diet, hemorrhoids and diverticulosis.  YOU HAD AN ENDOSCOPIC PROCEDURE TODAY AT [facility]:   Refer to the procedure report that was given to you for any specific questions about what was found during the examination.  If the procedure report does not answer your questions, please call your gastroenterologist to clarify.  If you requested that your care partner not be given the details of your procedure findings, then the procedure report has been included in a sealed envelope for you to review at your convenience later.  YOU SHOULD EXPECT: Some feelings of bloating in the abdomen. Passage of more gas than usual.  Walking can help get rid of the air that was put into your GI tract during the procedure and reduce the bloating. If you had a lower endoscopy (such as a colonoscopy or flexible sigmoidoscopy) you may notice spotting of blood in your stool or on the toilet paper. If you underwent a bowel prep for your procedure, you may not have a normal bowel movement for a few days.  Please Note:  You might notice some irritation and congestion in your nose or some drainage.  This is from the oxygen used during your procedure.  There is no need for concern and it should clear up in a day or so.  SYMPTOMS TO REPORT IMMEDIATELY:   Following lower endoscopy (colonoscopy or flexible sigmoidoscopy):  Excessive amounts of blood in the stool  Significant tenderness or worsening of abdominal pains  Swelling of the abdomen that is new, acute  Fever of 100F or higher   Following upper endoscopy (EGD)  Vomiting of blood or coffee ground material  New chest pain or pain under the shoulder blades  Painful or persistently difficult swallowing  New shortness of breath  Fever of 100F or higher  Black, tarry-looking stools  For urgent or emergent issues, a gastroenterologist can be reached at any hour by calling [phone]  [phone]. Do not use MyChart messaging for urgent concerns.    DIET:  We do recommend a small meal at first, but then you may proceed to your regular diet.  Drink plenty of fluids but you should avoid alcoholic beverages for 24 hours.  ACTIVITY:  You should plan to take it easy for the rest of today and you should NOT DRIVE or use heavy machinery until tomorrow (because of the sedation medicines used during the test).    FOLLOW UP: Our staff will call the number listed on your records 48-72 hours following your procedure to check on you and address any questions or concerns that you may have regarding the information given to you following your procedure. If we do not reach you, we will leave a message.  We will attempt to reach you two times.  During this call, we will ask if you have developed any symptoms of COVID 19. If you develop any symptoms (ie: fever, flu-like symptoms, shortness of breath, cough etc.) before then, please call [phone].  If you test positive for Covid 19 in the 2 weeks post procedure, please call and report this information to [place].    If any biopsies were taken you will be contacted by phone or by letter within the next 1-3 weeks.  Please call us at [phone] if you have not heard about the biopsies in 3 weeks.    SIGNATURES/CONFIDENTIALITY: You and/or your care partner have signed paperwork which will be entered into your electronic medical  record.  These signatures attest to the fact that that the information above on your After Visit Summary has been reviewed and is understood.  Full responsibility of the confidentiality of this discharge information lies with you and/or your care-partner.

## 2019-12-16 NOTE — Op Note (Signed)
[facility] Patient Name: Richard Irwin Procedure Date: 12/16/2019 10:45 AM MRN: [id number] Endoscopist: Ladene Artist , MD Age: 74 Referring MD:  Date of Birth: Feb 02, 1946 Gender: Male Account #: [id number] Procedure:                Colonoscopy Indications:              Surveillance: Personal history of adenomatous                            polyps on last colonoscopy > 5 years ago Medicines:                Monitored Anesthesia Care Procedure:                Pre-Anesthesia Assessment:                           - Prior to the procedure, a History and Physical                            was performed, and patient medications and                            allergies were reviewed. The patient's tolerance of                            previous anesthesia was also reviewed. The risks                            and benefits of the procedure and the sedation                            options and risks were discussed with the patient.                            All questions were answered, and informed consent                            was obtained. Prior Anticoagulants: The patient has                            taken no previous anticoagulant or antiplatelet                            agents. ASA Grade Assessment: II - A patient with                            mild systemic disease. After reviewing the risks                            and benefits, the patient was deemed in                            satisfactory condition to undergo the procedure.  After obtaining informed consent, the colonoscope                            was passed under direct vision. Throughout the                            procedure, the patient's blood pressure, pulse, and                            oxygen saturations were monitored continuously. The                            Colonoscope was introduced through the anus and                            advanced to the the  cecum, identified by                            appendiceal orifice and ileocecal valve. The                            ileocecal valve, appendiceal orifice, and rectum                            were photographed. The quality of the bowel                            preparation was good. The colonoscopy was performed                            without difficulty. The patient tolerated the                            procedure well. Scope In: 10:49:01 AM Scope Out: 11:02:04 AM Scope Withdrawal Time: 0 hours 10 minutes 13 seconds  Total Procedure Duration: 0 hours 13 minutes 3 seconds  Findings:                 The perianal and digital rectal examinations were                            normal.                           Two sessile polyps were found in the transverse                            colon. The polyps were 5 to 6 mm in size. These                            polyps were removed with a cold snare. Resection                            and retrieval were complete.  Multiple medium-mouthed diverticula were found in                            the left colon. There was no evidence of                            diverticular bleeding.                           Internal hemorrhoids were found during                            retroflexion. The hemorrhoids were medium-sized and                            Grade I (internal hemorrhoids that do not prolapse).                           The exam was otherwise without abnormality on                            direct and retroflexion views. Complications:            No immediate complications. Estimated blood loss:                            None. Estimated Blood Loss:     Estimated blood loss: none. Impression:               - Two 5 to 6 mm polyps in the transverse colon,                            removed with a cold snare. Resected and retrieved.                           - Mild diverticulosis in the left colon.                            - Internal hemorrhoids.                           - The examination was otherwise normal on direct                            and retroflexion views. Recommendation:           - Repeat colonoscopy after studies are complete for                            surveillance based on pathology results.                           - Patient has a contact number available for                            emergencies. The signs and symptoms of potential  delayed complications were discussed with the                            patient. Return to normal activities tomorrow.                            Written discharge instructions were provided to the                            patient.                           - High fiber diet.                           - Continue present medications.                           - Await pathology results. Ladene Artist, MD 12/16/2019 11:05:30 AM This report has been signed electronically.

## 2019-12-16 NOTE — Op Note (Signed)
[place] Patient Name: Richard Irwin Procedure Date: 12/16/2019 10:44 AM MRN: [id number] Endoscopist: Ladene Artist , MD Age: 74 Referring MD:  Date of Birth: [birth year] Gender: Male Account #: [id number] Procedure:                Upper GI endoscopy Indications:              Anemia, Recent alcoholic hepatitis, Abnormal                            ultrasound of the liver - fatty infiltration,                            possible cirrhosis Medicines:                Monitored Anesthesia Care Procedure:                Pre-Anesthesia Assessment:                           - Prior to the procedure, a History and Physical                            was performed, and patient medications and                            allergies were reviewed. The patient's tolerance of                            previous anesthesia was also reviewed. The risks                            and benefits of the procedure and the sedation                            options and risks were discussed with the patient.                            All questions were answered, and informed consent                            was obtained. Prior Anticoagulants: The patient has                            taken no previous anticoagulant or antiplatelet                            agents. ASA Grade Assessment: II - A patient with                            mild systemic disease. After reviewing the risks                            and benefits, the patient was deemed in  satisfactory condition to undergo the procedure.                           After obtaining informed consent, the endoscope was                            passed under direct vision. Throughout the                            procedure, the patient's blood pressure, pulse, and                            oxygen saturations were monitored continuously. The                            Endoscope was introduced through the mouth,  and                            advanced to the second part of duodenum. The upper                            GI endoscopy was accomplished without difficulty.                            The patient tolerated the procedure well. Scope In: Scope Out: Findings:                 One column of grade I varices with no bleeding and                            no stigmata of recent bleeding were found in the                            distal esophagus,. They were 3 mm in largest                            diameter. No red wale signs were present.                           One benign-appearing, intrinsic mild stenosis was                            found at the gastroesophageal junction. This                            stenosis measured 1.4 cm (inner diameter) x less                            than one cm (in length). The stenosis was traversed.                           The exam of the esophagus was otherwise normal.  A few localized friable small erosions with no                            bleeding and no stigmata of recent bleeding were                            found in the prepyloric region of the stomach.                            Biopsies were taken with a cold forceps for                            histology.                           Patchy mildly erythematous mucosa friable without                            bleeding was found in the gastric fundus and in the                            gastric body. Biopsies were taken with a cold                            forceps for histology.                           The exam of the stomach was otherwise normal.                           The duodenal bulb and second portion of the                            duodenum were normal. Complications:            No immediate complications. Estimated Blood Loss:     Estimated blood loss was minimal. Impression:               - Grade I esophageal varices with no bleeding and                             no stigmata of recent bleeding.                           - Benign-appearing esophageal stenosis.                           - Erosive gastropathy with no bleeding and no                            stigmata of recent bleeding. Biopsied.                           - Erythematous mucosa in the gastric fundus and  gastric body. Biopsied.                           - Normal duodenal bulb and second portion of the                            duodenum. Recommendation:           - Patient has a contact number available for                            emergencies. The signs and symptoms of potential                            delayed complications were discussed with the                            patient. Return to normal activities tomorrow.                            Written discharge instructions were provided to the                            patient.                           - Resume previous diet.                           - Continue present medications.                           - Await pathology results.                           - Repeat EGD in 2 years.                           - Return to GI office in 1 month with me or PG. Ladene Artist, MD 12/16/2019 11:33:56 AM This report has been signed electronically.

## 2019-12-16 NOTE — Progress Notes (Signed)
To PACU, VSS. Report to [email or web address].tb 

## 2019-12-16 NOTE — Progress Notes (Signed)
Called to room to assist during endoscopic procedure.  Patient ID and intended procedure confirmed with present staff. Received instructions for my participation in the procedure from the performing physician.  

## 2019-12-18 ENCOUNTER — Telehealth: Payer: Self-pay | Admitting: *Deleted

## 2019-12-18 ENCOUNTER — Encounter: Payer: Self-pay | Admitting: Gastroenterology

## 2019-12-18 NOTE — Telephone Encounter (Signed)
  Follow up Call-  Call back number 12/16/2019  Post procedure Call Back phone  # [phone]  Permission to leave phone message Yes  Some recent data might be hidden     Patient questions:  Do you have a fever, pain , or abdominal swelling? No. Pain Score  0 *  Have you tolerated food without any problems? Yes.    Have you been able to return to your normal activities? Yes.    Do you have any questions about your discharge instructions: Diet   No. Medications  No. Follow up visit  No.  Do you have questions or concerns about your Care? No.  Actions: * If pain score is 4 or above: No action needed, pain <4.  1. Have you developed a fever since your procedure? no  2.   Have you had an respiratory symptoms (SOB or cough) since your procedure? no  3.   Have you tested positive for COVID 19 since your procedure no  4.   Have you had any family members/close contacts diagnosed with the COVID 19 since your procedure?  no   If yes to any of these questions please route to Joylene John, RN and Erenest Rasher, RN

## 2019-12-18 NOTE — Telephone Encounter (Signed)
Message left

## 2020-01-23 ENCOUNTER — Encounter: Payer: Self-pay | Admitting: Gastroenterology

## 2020-01-23 ENCOUNTER — Other Ambulatory Visit (INDEPENDENT_AMBULATORY_CARE_PROVIDER_SITE_OTHER): Payer: Medicare Other

## 2020-01-23 ENCOUNTER — Ambulatory Visit (INDEPENDENT_AMBULATORY_CARE_PROVIDER_SITE_OTHER): Payer: Medicare Other | Admitting: Gastroenterology

## 2020-01-23 VITALS — BP 120/78 | HR 84 | Temp 97.8°F | Ht 70.0 in | Wt 165.0 lb

## 2020-01-23 DIAGNOSIS — K7010 Alcoholic hepatitis without ascites: Secondary | ICD-10-CM

## 2020-01-23 LAB — CBC WITH DIFFERENTIAL/PLATELET
Basophils Absolute: 0.1 10*3/uL (ref 0.0–0.1)
Basophils Relative: 1.3 % (ref 0.0–3.0)
Eosinophils Absolute: 0.2 10*3/uL (ref 0.0–0.7)
Eosinophils Relative: 3.7 % (ref 0.0–5.0)
HCT: 33.5 % — ABNORMAL LOW (ref 39.0–52.0)
Hemoglobin: 11.4 g/dL — ABNORMAL LOW (ref 13.0–17.0)
Lymphocytes Relative: 36.3 % (ref 12.0–46.0)
Lymphs Abs: 1.5 10*3/uL (ref 0.7–4.0)
MCHC: 34 g/dL (ref 30.0–36.0)
MCV: 96.2 fl (ref 78.0–100.0)
Monocytes Absolute: 0.5 10*3/uL (ref 0.1–1.0)
Monocytes Relative: 13.4 % — ABNORMAL HIGH (ref 3.0–12.0)
Neutro Abs: 1.8 10*3/uL (ref 1.4–7.7)
Neutrophils Relative %: 45.3 % (ref 43.0–77.0)
Platelets: 131 10*3/uL — ABNORMAL LOW (ref 150.0–400.0)
RBC: 3.48 Mil/uL — ABNORMAL LOW (ref 4.22–5.81)
RDW: 16.4 % — ABNORMAL HIGH (ref 11.5–15.5)
WBC: 4.1 10*3/uL (ref 4.0–10.5)

## 2020-01-23 LAB — PROTIME-INR
INR: 1.2 ratio — ABNORMAL HIGH (ref 0.8–1.0)
Prothrombin Time: 13.3 s — ABNORMAL HIGH (ref 9.6–13.1)

## 2020-01-23 LAB — COMPREHENSIVE METABOLIC PANEL
ALT: 15 U/L (ref 0–53)
AST: 31 U/L (ref 0–37)
Albumin: 3 g/dL — ABNORMAL LOW (ref 3.5–5.2)
Alkaline Phosphatase: 93 U/L (ref 39–117)
BUN: 7 mg/dL (ref 6–23)
CO2: 27 mEq/L (ref 19–32)
Calcium: 9.3 mg/dL (ref 8.4–10.5)
Chloride: 106 mEq/L (ref 96–112)
Creatinine, Ser: 0.76 mg/dL (ref 0.40–1.50)
GFR: 100.36 mL/min (ref >60.00)
Glucose, Bld: 119 mg/dL — ABNORMAL HIGH (ref 70–99)
Potassium: 3.9 mEq/L (ref 3.5–5.1)
Sodium: 136 mEq/L (ref 135–145)
Total Bilirubin: 1.7 mg/dL — ABNORMAL HIGH (ref 0.2–1.2)
Total Protein: 6.6 g/dL (ref 6.0–8.3)

## 2020-01-23 NOTE — Patient Instructions (Signed)
Your provider has requested that you go to the basement level for lab work before leaving today. Press "B" on the elevator. The lab is located at the first door on the left as you exit the elevator.  Abstain from alcohol.   Call our office if your weight continues to decrease.   Thank you for choosing me and [facility].  Pricilla Riffle. Dagoberto Ligas., MD., Marval Regal

## 2020-01-23 NOTE — Addendum Note (Signed)
Addended by: Dorisann Frames L on: 01/23/2020 12:26 PM   Modules accepted: Orders

## 2020-01-23 NOTE — Progress Notes (Signed)
    History of Present Illness: This is a 73 year old male returning for follow-up of alcoholic hepatitis, anemia, weight loss.  He is accompanied by his wife.  He has been completely avoiding all alcohol for 3 months.  He has noted a 25 pound weight loss over that timeframe.  He states his appetite is good and he eats 2 or 3 meals per day.  He has noticed frequent urination particularly at night.  Colonoscopy 11/2019 - Two 5 to 6 mm polyps in the transverse colon, removed with a cold snare. Resected and retrieved. - Mild diverticulosis in the left colon. - Internal hemorrhoids. - The examination was otherwise normal on direct and retroflexion views.  EGD 11/2019 - Grade I esophageal varices with no bleeding and no stigmata of recent bleeding. - Benign-appearing esophageal stenosis. - Erosive gastropathy with no bleeding and no stigmata of recent bleeding. Biopsied. - Erythematous mucosa in the gastric fundus and gastric body. Biopsied. - Normal duodenal bulb and second portion of the duodenum.  Current Medications, Allergies, Past Medical History, Past Surgical History, Family History and Social History were reviewed in [employer] record.   Physical Exam: General: Well developed, well nourished, no acute distress Head: Normocephalic and atraumatic Eyes:  sclerae anicteric, EOMI Ears: Normal auditory acuity Mouth: Not examined, mask on during Covid-19 pandemic Lungs: Clear throughout to auscultation Heart: Regular rate and rhythm; no murmurs, rubs or bruits Abdomen: Soft, non tender and non distended. No masses, hepatosplenomegaly or hernias noted. Normal Bowel sounds Rectal: Not done Musculoskeletal: Symmetrical with no gross deformities  Pulses:  Normal pulses noted Extremities: No clubbing, cyanosis, edema or deformities noted Neurological: Alert oriented x 4, grossly nonfocal Psychological:  Alert and cooperative. Normal mood and affect   Assessment and  Recommendations:  1. Alcoholic hepatitis.  Signs of portal hypertension with esophageal varices which could be due to alcoholic hepatitis or early cirrhosis.  Continue complete avoidance of alcohol.  Encouraged joining [place].  No more than 2 g of Tylenol daily.  Hepatitis A and B vaccination.  CMP, CBC, PT/INR today.  REV 3 months.   2. Macrocytic anemia.  Likely related to alcohol abuse.  CBC today.  Follow-up with PCP.  3. Weight loss. Increase daily calorie intake.  Follow weight at home.  If he is unable to maintain his weight I have asked him to call for consideration of further evaluation however I feel his weight loss is likely due to elimination of alcohol.   4.  Frequent nocturnal urination.  Follow-up with PCP.

## 2020-01-30 ENCOUNTER — Ambulatory Visit (INDEPENDENT_AMBULATORY_CARE_PROVIDER_SITE_OTHER): Payer: Medicare Other | Admitting: Gastroenterology

## 2020-01-30 DIAGNOSIS — Z23 Encounter for immunization: Secondary | ICD-10-CM | POA: Diagnosis not present

## 2020-03-05 ENCOUNTER — Ambulatory Visit (INDEPENDENT_AMBULATORY_CARE_PROVIDER_SITE_OTHER): Payer: Medicare Other | Admitting: Gastroenterology

## 2020-03-05 DIAGNOSIS — Z23 Encounter for immunization: Secondary | ICD-10-CM

## 2020-03-31 ENCOUNTER — Telehealth: Payer: Self-pay | Admitting: Hematology

## 2020-03-31 NOTE — Telephone Encounter (Signed)
Received a new hem referral from Dr. Harrington Challenger for low WBC, Hb and Platelets, with elevated monocytes and weight loss. Richard Irwin has been cld and scheduled to see Richard Irwin on 7/21 at 11am. Pt aware to arrive 15 minutes early.

## 2020-04-08 ENCOUNTER — Other Ambulatory Visit: Payer: Self-pay

## 2020-04-08 ENCOUNTER — Inpatient Hospital Stay: Payer: Medicare Other

## 2020-04-08 ENCOUNTER — Inpatient Hospital Stay: Payer: Medicare Other | Attending: Hematology | Admitting: Hematology

## 2020-04-08 VITALS — BP 144/92 | HR 69 | Temp 97.5°F | Resp 18 | Ht 70.0 in | Wt 163.2 lb

## 2020-04-08 DIAGNOSIS — Z888 Allergy status to other drugs, medicaments and biological substances status: Secondary | ICD-10-CM

## 2020-04-08 DIAGNOSIS — D539 Nutritional anemia, unspecified: Secondary | ICD-10-CM | POA: Insufficient documentation

## 2020-04-08 DIAGNOSIS — F1010 Alcohol abuse, uncomplicated: Secondary | ICD-10-CM | POA: Diagnosis not present

## 2020-04-08 DIAGNOSIS — K760 Fatty (change of) liver, not elsewhere classified: Secondary | ICD-10-CM | POA: Insufficient documentation

## 2020-04-08 DIAGNOSIS — Z79899 Other long term (current) drug therapy: Secondary | ICD-10-CM | POA: Diagnosis not present

## 2020-04-08 DIAGNOSIS — R634 Abnormal weight loss: Secondary | ICD-10-CM | POA: Diagnosis not present

## 2020-04-08 DIAGNOSIS — M7989 Other specified soft tissue disorders: Secondary | ICD-10-CM | POA: Diagnosis not present

## 2020-04-08 DIAGNOSIS — K7010 Alcoholic hepatitis without ascites: Secondary | ICD-10-CM | POA: Diagnosis not present

## 2020-04-08 DIAGNOSIS — R358 Other polyuria: Secondary | ICD-10-CM | POA: Diagnosis not present

## 2020-04-08 DIAGNOSIS — D696 Thrombocytopenia, unspecified: Secondary | ICD-10-CM | POA: Diagnosis not present

## 2020-04-08 DIAGNOSIS — Z801 Family history of malignant neoplasm of trachea, bronchus and lung: Secondary | ICD-10-CM | POA: Diagnosis not present

## 2020-04-08 DIAGNOSIS — D649 Anemia, unspecified: Secondary | ICD-10-CM

## 2020-04-08 DIAGNOSIS — R350 Frequency of micturition: Secondary | ICD-10-CM | POA: Insufficient documentation

## 2020-04-08 DIAGNOSIS — Z8379 Family history of other diseases of the digestive system: Secondary | ICD-10-CM | POA: Diagnosis not present

## 2020-04-08 DIAGNOSIS — F4310 Post-traumatic stress disorder, unspecified: Secondary | ICD-10-CM | POA: Insufficient documentation

## 2020-04-08 DIAGNOSIS — R55 Syncope and collapse: Secondary | ICD-10-CM | POA: Insufficient documentation

## 2020-04-08 LAB — CBC WITH DIFFERENTIAL/PLATELET
Abs Immature Granulocytes: 0 10*3/uL (ref 0.00–0.07)
Basophils Absolute: 0.1 10*3/uL (ref 0.0–0.1)
Basophils Relative: 1 %
Eosinophils Absolute: 0.1 10*3/uL (ref 0.0–0.5)
Eosinophils Relative: 3 %
HCT: 38.9 % — ABNORMAL LOW (ref 39.0–52.0)
Hemoglobin: 13 g/dL (ref 13.0–17.0)
Immature Granulocytes: 0 %
Lymphocytes Relative: 43 %
Lymphs Abs: 1.8 10*3/uL (ref 0.7–4.0)
MCH: 32.9 pg (ref 26.0–34.0)
MCHC: 33.4 g/dL (ref 30.0–36.0)
MCV: 98.5 fL (ref 80.0–100.0)
Monocytes Absolute: 0.4 10*3/uL (ref 0.1–1.0)
Monocytes Relative: 10 %
Neutro Abs: 1.8 10*3/uL (ref 1.7–7.7)
Neutrophils Relative %: 43 %
Platelets: 117 10*3/uL — ABNORMAL LOW (ref 150–400)
RBC: 3.95 MIL/uL — ABNORMAL LOW (ref 4.22–5.81)
RDW: 13.5 % (ref 11.5–15.5)
WBC: 4.2 10*3/uL (ref 4.0–10.5)
nRBC: 0 % (ref 0.0–0.2)

## 2020-04-08 LAB — CMP (CANCER CENTER ONLY)
ALT: 15 U/L (ref 0–44)
AST: 29 U/L (ref 15–41)
Albumin: 3.4 g/dL — ABNORMAL LOW (ref 3.5–5.0)
Alkaline Phosphatase: 111 U/L (ref 38–126)
Anion gap: 8 (ref 5–15)
BUN: 9 mg/dL (ref 8–23)
CO2: 25 mmol/L (ref 22–32)
Calcium: 10.3 mg/dL (ref 8.9–10.3)
Chloride: 106 mmol/L (ref 98–111)
Creatinine: 0.84 mg/dL (ref 0.61–1.24)
GFR, Est AFR Am: >60 mL/min (ref >60)
GFR, Estimated: >60 mL/min (ref >60)
Glucose, Bld: 109 mg/dL — ABNORMAL HIGH (ref 70–99)
Potassium: 4.3 mmol/L (ref 3.5–5.1)
Sodium: 139 mmol/L (ref 135–145)
Total Bilirubin: 1.5 mg/dL — ABNORMAL HIGH (ref 0.3–1.2)
Total Protein: 7.3 g/dL (ref 6.5–8.1)

## 2020-04-08 LAB — IRON AND TIBC
Iron: 162 ug/dL (ref 42–163)
Saturation Ratios: 73 % — ABNORMAL HIGH (ref 20–55)
TIBC: 223 ug/dL (ref 202–409)
UIBC: 60 ug/dL — ABNORMAL LOW (ref 117–376)

## 2020-04-08 LAB — FERRITIN: Ferritin: 200 ng/mL (ref 24–336)

## 2020-04-08 LAB — SEDIMENTATION RATE: Sed Rate: 8 mm/hr (ref 0–16)

## 2020-04-08 LAB — VITAMIN D 25 HYDROXY (VIT D DEFICIENCY, FRACTURES): Vit D, 25-Hydroxy: 21.76 ng/mL — ABNORMAL LOW (ref 30–100)

## 2020-04-08 LAB — VITAMIN B12: Vitamin B-12: 954 pg/mL — ABNORMAL HIGH (ref 180–914)

## 2020-04-08 LAB — TSH: TSH: 1.041 u[IU]/mL (ref 0.320–4.118)

## 2020-04-08 NOTE — Progress Notes (Signed)
HEMATOLOGY/ONCOLOGY CONSULTATION NOTE  Date of Service: 04/08/2020  Patient Care Team: Lawerance Cruel, MD as PCP - General (Family Medicine)  CHIEF COMPLAINTS/PURPOSE OF CONSULTATION:  Anemia/Thrombocytopenia  HISTORY OF PRESENTING ILLNESS:   Richard Irwin is a wonderful 74 y.o. male who has been referred to [place] by Dr. Harrington Challenger for evaluation and management of anemia/thrombocytopenia. Pt is accompanied today by his wife. The pt reports that he is doing well overall.   The pt reports that he went to the ED on 10/23/2019 after a syncopal event. It was discovered that the pt had been abusing alcohol. On average he was drinking 10 alcoholic beverages per day. He has not had any alcoholic beverages since his ED admission. Pt has been diagnosed with Fatty Liver and alcoholic hepatitis. Pt was gradually losing weight during the period that he was drinking alcohol but rapidly lost weight after quitting alcohol. He notes that his weight has been steady over the last 10 weeks. He has been eating well and including high-protein boost in his diet regularly and denies any loss of appetite. Pt reports frequent urination, especially directly prior to bed. Pt is currently taking a daily multivitamin, but is no longer taking Folic acid at the direction of Dr. Harrington Challenger. He has had no symptoms of withdrawal since his hospitalization but his wife reports that he is having more depressed moods. Pt is a [place] veteran and has some PTSD, but has not spoken to a counselor about this. He had some GI bleeding during his hospitalization, but has not experienced this prior to or since. His hemmorrhoids have been addressed. Pt has never smoked cigarettes and has not been offically diagnosed with COPD. Pt has noticed some weakness over the last few months and stiff, achy joints. His joints are stiff until about lunchtime.   Most recent lab results (03/19/2020) of CBC w/diff and CMP is as follows: all values are WNL except  for WBC at 2.3K, RBC at 3.97, Hgb at 12.6, HCT  at 36.9, MCV at 77.9, MCH at 33.6, PLT at 125K, Neutro Rel at 35.1, Lymphs Rel at 45.5, Mono Rel at 13.4, Baso Rel at 2.4, Neutro Abs at 1.2K, Glucose at 101, Albumin at 3.1, Total Bilirubin at 1.5.  On review of systems, pt reports polyuria, leg swelling, joint aches and denies low appetite, fevers, chills, night sweats, new lumps/bumps, rash, back pain, black/bloody stools and any other symptoms.   On PMHx the pt reports Fatty Liver, Alcoholic Hepatitis, Hemmorrhoids, PTSD, GI bleed. On Social Hx the pt reports that at his heaviest he was drinking about 10 alcoholic beverages per day. He has discontinued using any alcoholic substances since 10/23/2019. He is a non-smoker.    MEDICAL HISTORY:  Past Medical History:  Diagnosis Date  . Acute blood loss anemia   . Anemia   . Diverticulosis   . Emphysema/COPD ([place])   . Esophageal varices ([place])   . H/O: GI bleed   . Hypertension   . Insomnia   . Status post colonoscopy with polypectomy    with bleed  . Thoracic ascending aortic aneurysm ([place]) 05/07/2013  . Tubular adenoma     SURGICAL HISTORY: Past Surgical History:  Procedure Laterality Date  . APPENDECTOMY    . COLONOSCOPY WITH PROPOFOL N/A 07/09/2014   Procedure: COLONOSCOPY WITH PROPOFOL;  Surgeon: Irene Shipper, MD;  Location: [facility];  Service: Endoscopy;  Laterality: N/A;  . EXCISIONAL HEMORRHOIDECTOMY    . HERNIA REPAIR    .  ROTATOR CUFF REPAIR      SOCIAL HISTORY: Social History   Socioeconomic History  . Marital status: Married    Spouse name: Not on file  . Number of children: 1  . Years of education: Not on file  . Highest education level: Not on file  Occupational History  . Occupation: retired Press photographer  Tobacco Use  . Smoking status: Never Smoker  . Smokeless tobacco: Never Used  Vaping Use  . Vaping Use: Never used  Substance and Sexual Activity  . Alcohol use: Not Currently    Comment: beer 2-4 daily  .  Drug use: No  . Sexual activity: Yes  Other Topics Concern  . Not on file  Social History Narrative  . Not on file   Social Determinants of Health   Financial Resource Strain:   . Difficulty of Paying Living Expenses:   Food Insecurity:   . Worried About Charity fundraiser in the Last Year:   . Arboriculturist in the Last Year:   Transportation Needs:   . Film/video editor (Medical):   Marland Kitchen Lack of Transportation (Non-Medical):   Physical Activity:   . Days of Exercise per Week:   . Minutes of Exercise per Session:   Stress:   . Feeling of Stress :   Social Connections:   . Frequency of Communication with Friends and Family:   . Frequency of Social Gatherings with Friends and Family:   . Attends Religious Services:   . Active Member of Clubs or Organizations:   . Attends Archivist Meetings:   Marland Kitchen Marital Status:   Intimate Partner Violence:   . Fear of Current or Ex-Partner:   . Emotionally Abused:   Marland Kitchen Physically Abused:   . Sexually Abused:     FAMILY HISTORY: Family History  Problem Relation Age of Onset  . Cirrhosis Father   . Lung cancer Mother   . Colon cancer Neg Hx   . Pancreatic cancer Neg Hx   . Rectal cancer Neg Hx   . Stomach cancer Neg Hx     ALLERGIES:  is allergic to lisinopril.  MEDICATIONS:  Current Outpatient Medications  Medication Sig Dispense Refill  . losartan (COZAAR) 50 MG tablet Take 50 mg by mouth daily.    . metoprolol succinate (TOPROL-XL) 25 MG 24 hr tablet Take 25 mg by mouth daily.    . Multiple Vitamin (MULTIVITAMIN) tablet Take 1 tablet by mouth daily.     No current facility-administered medications for this visit.    REVIEW OF SYSTEMS:    10 Point review of Systems was done is negative except as noted above.  PHYSICAL EXAMINATION: ECOG PERFORMANCE STATUS: 0 - Asymptomatic  . Vitals:   04/08/20 1128  BP: (!) 144/92  Pulse: 69  Resp: 18  Temp: (!) 97.5 F (36.4 C)  SpO2: 100%   Filed Weights    04/08/20 1128  Weight: 163 lb 3.2 oz (74 kg)   .Body mass index is 23.42 kg/m.  GENERAL:alert, in no acute distress and comfortable SKIN: no acute rashes, no significant lesions EYES: conjunctiva are pink and non-injected, sclera anicteric OROPHARYNX: MMM, no exudates, no oropharyngeal erythema or ulceration NECK: supple, no JVD LYMPH:  no palpable lymphadenopathy in the cervical, axillary or inguinal regions LUNGS: clear to auscultation b/l with normal respiratory effort HEART: regular rate & rhythm ABDOMEN:  normoactive bowel sounds , non tender, not distended. Extremity: no pedal edema PSYCH: alert & oriented x 3  with fluent speech NEURO: no focal motor/sensory deficits  LABORATORY DATA:  I have reviewed the data as listed  . CBC Latest Ref Rng & Units 04/08/2020 01/23/2020 10/29/2019  WBC 4.0 - 10.5 K/uL 4.2 4.1 11.2(H)  Hemoglobin 13.0 - 17.0 g/dL 13.0 11.4(L) 9.5(L)  Hematocrit 39 - 52 % 38.9(L) 33.5(L) 28.1(L)  Platelets 150 - 400 K/uL 117(L) 131.0(L) 134(L)   . CBC    Component Value Date/Time   WBC 4.2 04/08/2020 1214   RBC 3.95 (L) 04/08/2020 1214   HGB 13.0 04/08/2020 1214   HCT 39.3 04/08/2020 1215   HCT 38.9 (L) 04/08/2020 1214   PLT 117 (L) 04/08/2020 1214   MCV 98.5 04/08/2020 1214   MCH 32.9 04/08/2020 1214   MCHC 33.4 04/08/2020 1214   RDW 13.5 04/08/2020 1214   LYMPHSABS 1.8 04/08/2020 1214   MONOABS 0.4 04/08/2020 1214   EOSABS 0.1 04/08/2020 1214   BASOSABS 0.1 04/08/2020 1214     . CMP Latest Ref Rng & Units 04/08/2020 01/23/2020 11/26/2019  Glucose 70 - 99 mg/dL 109(H) 119(H) -  BUN 8 - 23 mg/dL 9 7 -  Creatinine 0.61 - 1.24 mg/dL 0.84 0.76 -  Sodium 135 - 145 mmol/L 139 136 -  Potassium 3.5 - 5.1 mmol/L 4.3 3.9 -  Chloride 98 - 111 mmol/L 106 106 -  CO2 22 - 32 mmol/L 25 27 -  Calcium 8.9 - 10.3 mg/dL 10.3 9.3 -  Total Protein 6.5 - 8.1 g/dL 7.3 6.6 6.6  Total Bilirubin 0.3 - 1.2 mg/dL 1.5(H) 1.7(H) 2.6(H)  Alkaline Phos 38 - 126 U/L 111 93  118(H)  AST 15 - 41 U/L 29 31 54(H)  ALT 0 - 44 U/L 15 15 20    Component     Latest Ref Rng & Units 10/29/2019 01/23/2020           RBC     4.22 - 5.81 Mil/uL 2.60 (L) 3.48 (L)  Hemoglobin     13.0 - 17.0 g/dL 9.5 (L) 11.4 (L)  HCT     39 - 52 % 28.1 (L) 33.5 (L)   Component     Latest Ref Rng & Units 10/29/2019 01/23/2020           Platelets     150 - 400 K/uL 134 (L) 131.0 (L)    RADIOGRAPHIC STUDIES: I have personally reviewed the radiological images as listed and agreed with the findings in the report. No results found.  ASSESSMENT & PLAN:   74 yo with  1. Resolving macrocytic Anemia - likely related to ETOH abuse 2. Thrombocytopenia likely related to ETOH use, r/o alcohol related liver disease PLAN: -Discussed patient's most recent labs from 03/19/2020, WBC at 2.3K, RBC at 3.97, Hgb at 12.6, HCT  at 36.9, MCV at 77.9, MCH at 33.6, PLT at 125K, Neutro Rel at 35.1, Lymphs Rel at 45.5, Mono Rel at 13.4, Baso Rel at 2.4, Neutro Abs at 1.2K, Glucose at 101, Albumin at 3.1, Total Bilirubin at 1.5 -Advised pt that his blood counts have continued to trend towards normal since February -Advised pt that excessive alcohol intake can suppress bone marrow and create nutritional deficiencies. This can suppress blood counts.  -Advised pt that cytopenias could be caused by a primary bone marrow disorder but this is unlikely as blood counts are improving with alcohol cessation alone. -Advised pt that the spleen may be overactive for months after liver inflammation. An overactive spleen can store blood cells, leading to them  being misread as low.  -Advised pt that aggressive nutritional therapy can be useful for improving health after long-term heavy alcohol use.  -Advised pt that there can be alcohol-induced depression after quitting alcohol. Recommend pt f/u with Dr. Harrington Challenger to manage.  -Recommend pt try to consume most of his liquids earlier in the day and avoid caffeine in the evening to reduce  urination at night. -Recommend pt use graded sports compression socks for leg swelling. -Advised pt that frequent urination at night could also be caused by BPH. -Recommend OTC B-complex vitamin & 1000-2000 UT Vitamin D daily  -Will get labs today  -Will see back in 2 weeks via phone   FOLLOW UP: Labs today Phone visit with Dr Irene Limbo in 2 weeks  Orders Placed This Encounter  Procedures  . CBC with Differential/Platelet    Standing Status:   Future    Number of Occurrences:   1    Standing Expiration Date:   04/08/2021  . CMP ([place] only)    Standing Status:   Future    Number of Occurrences:   1    Standing Expiration Date:   04/08/2021  . Multiple Myeloma Panel (SPEP&IFE w/QIG)    Standing Status:   Future    Number of Occurrences:   1    Standing Expiration Date:   04/08/2021  . Kappa/lambda light chains    Standing Status:   Future    Number of Occurrences:   1    Standing Expiration Date:   04/08/2021  . Sedimentation rate    Standing Status:   Future    Number of Occurrences:   1    Standing Expiration Date:   04/08/2021  . Vitamin B12    Standing Status:   Future    Number of Occurrences:   1    Standing Expiration Date:   04/08/2021  . Folate RBC    Standing Status:   Future    Number of Occurrences:   1    Standing Expiration Date:   04/08/2021  . Vitamin B1    Standing Status:   Future    Number of Occurrences:   1    Standing Expiration Date:   04/08/2021  . Vitamin D 25 hydroxy    Standing Status:   Future    Number of Occurrences:   1    Standing Expiration Date:   04/08/2021  . Ferritin    Standing Status:   Future    Number of Occurrences:   1    Standing Expiration Date:   04/08/2021  . Iron and TIBC    Standing Status:   Future    Number of Occurrences:   1    Standing Expiration Date:   04/08/2021  . Copper, serum    Standing Status:   Future    Number of Occurrences:   1    Standing Expiration Date:   04/08/2021  . TSH    Standing Status:    Future    Number of Occurrences:   1    Standing Expiration Date:   04/08/2021     All of the patients questions were answered with apparent satisfaction. The patient knows to call the clinic with any problems, questions or concerns.  I spent 35 mins counseling the patient face to face. The total time spent in the appointment was 45 minutes and more than 50% was on counseling and direct patient cares.    Sullivan Lone MD  MS AAHIVMS [facility] Hematology/Oncology Physician [facility]  (Office):       [phone] (Work cell):  [phone] (Fax):           [phone]  04/08/2020 5:02 PM  I, Yevette Edwards, am acting as a scribe for Dr. Sullivan Lone.   .I have reviewed the above documentation for accuracy and completeness, and I agree with the above. Brunetta Genera MD

## 2020-04-09 LAB — FOLATE RBC
Folate, Hemolysate: 436 ng/mL
Folate, RBC: 1109 ng/mL (ref >498)
Hematocrit: 39.3 % (ref 37.5–51.0)

## 2020-04-09 LAB — KAPPA/LAMBDA LIGHT CHAINS
Kappa free light chain: 37.1 mg/L — ABNORMAL HIGH (ref 3.3–19.4)
Kappa, lambda light chain ratio: 1.46 (ref 0.26–1.65)
Lambda free light chains: 25.4 mg/L (ref 5.7–26.3)

## 2020-04-10 LAB — COPPER, SERUM: Copper: 76 ug/dL (ref 69–132)

## 2020-04-13 LAB — MULTIPLE MYELOMA PANEL, SERUM
Albumin SerPl Elph-Mcnc: 3.3 g/dL (ref 2.9–4.4)
Albumin/Glob SerPl: 1 (ref 0.7–1.7)
Alpha 1: 0.2 g/dL (ref 0.0–0.4)
Alpha2 Glob SerPl Elph-Mcnc: 0.4 g/dL (ref 0.4–1.0)
B-Globulin SerPl Elph-Mcnc: 0.9 g/dL (ref 0.7–1.3)
Gamma Glob SerPl Elph-Mcnc: 2 g/dL — ABNORMAL HIGH (ref 0.4–1.8)
Globulin, Total: 3.5 g/dL (ref 2.2–3.9)
IgA: 358 mg/dL (ref 61–437)
IgG (Immunoglobin G), Serum: 1791 mg/dL — ABNORMAL HIGH (ref 603–1613)
IgM (Immunoglobulin M), Srm: 257 mg/dL — ABNORMAL HIGH (ref 15–143)
Total Protein ELP: 6.8 g/dL (ref 6.0–8.5)

## 2020-04-13 LAB — VITAMIN B1: Vitamin B1 (Thiamine): 115.6 nmol/L (ref 66.5–200.0)

## 2020-04-14 ENCOUNTER — Telehealth: Payer: Self-pay | Admitting: Hematology

## 2020-04-14 NOTE — Telephone Encounter (Signed)
Scheduled per 07/21 los, patient has been called and notified.

## 2020-04-22 NOTE — Progress Notes (Signed)
HEMATOLOGY/ONCOLOGY CLINIC NOTE  Date of Service: 04/24/2020  Patient Care Team: Lawerance Cruel, MD as PCP - General (Family Medicine)  CHIEF COMPLAINTS/PURPOSE OF CONSULTATION:  Anemia/Thrombocytopenia  HISTORY OF PRESENTING ILLNESS:  I connected with Richard Irwin on 04/24/20 at 11:20 AM EDT and verified that I am speaking with the correct person using two identifiers.   I discussed the limitations, risks, security and privacy concerns of performing an evaluation and management service by telemedicine and the availability of in-person appointments. I also discussed with the patient that there may be a patient responsible charge related to this service. The patient expressed understanding and agreed to proceed.   Other persons participating in the visit and their role in the encounter:     -Dawayne Cirri, Medical Scribe   Patients location: Home  Providers location: [facility] is a wonderful 74 y.o. male who has been referred to [place] by Dr. Harrington Challenger for evaluation and management of anemia/thrombocytopenia. The patient's last visit with [place] was on 04/08/20. The pt reports that he is doing well overall.  The pt reports he is good. He has had no changes. Pt continues to be sober. He is eating well. Pt sleeping is good sometimes but other times not as well.   Lab results today (04/08/20) of CBC w/diff and CMP is as follows: all values are WNL except for RBC at 3.95, HCT at 38.9, Platelets at 117K, Glucose at 109, Albumin at 3.4, Total Bilirubin at 1.5 04/08/20 of MMP is as follows: all values are WNL except for IgG at 1791, IgM at 257, Gamma Glob SerPl Elph-Mcnc at 2.0 04/08/20 of Folate RBC is as follows: all values are  04/08/20 of TSH at 1.041: WNL 04/08/20 of Copper at 76: WNL 04/08/20 of Iron and TIBC is as follows: all values are WNL except for Saturation Ratios at 73, UIBC at 60 04/08/20 of Ferritin at 200: WNL 04/08/20 77f Vitamin D 25 Hydroxy  at 21.76 04/08/20 of Vitamin B1 at 115.6: WNL 04/08/20 of Vitamin B12 at 954 04/08/20 of Sedimentation Rate at 8: WNL 04/08/20 of Kappa/Lambda Light Chains is as follows: all values are WNL except for Kappa Free Light Chain at 37.1  On review of systems, pt reports healthy appetite, sleeping well, staying active and denies any other symptoms.    MEDICAL HISTORY:  Past Medical History:  Diagnosis Date   Acute blood loss anemia    Anemia    Diverticulosis    Emphysema/COPD ([place])    Esophageal varices (HCC)    H/O: GI bleed    Hypertension    Insomnia    Status post colonoscopy with polypectomy    with bleed   Thoracic ascending aortic aneurysm ([place]) 05/07/2013   Tubular adenoma     SURGICAL HISTORY: Past Surgical History:  Procedure Laterality Date   APPENDECTOMY     COLONOSCOPY WITH PROPOFOL N/A 07/09/2014   Procedure: COLONOSCOPY WITH PROPOFOL;  Surgeon: Irene Shipper, MD;  Location: [facility];  Service: Endoscopy;  Laterality: N/A;   EXCISIONAL HEMORRHOIDECTOMY     HERNIA REPAIR     ROTATOR CUFF REPAIR      SOCIAL HISTORY: Social History   Socioeconomic History   Marital status: Married    Spouse name: Not on file   Number of children: 1   Years of education: Not on file   Highest education level: Not on file  Occupational History   Occupation: retired  sales  Tobacco Use   Smoking status: Never Smoker   Smokeless tobacco: Never Used  Vaping Use   Vaping Use: Never used  Substance and Sexual Activity   Alcohol use: Not Currently    Comment: beer 2-4 daily   Drug use: No   Sexual activity: Yes  Other Topics Concern   Not on file  Social History Narrative   Not on file   Social Determinants of Health   Financial Resource Strain:    Difficulty of Paying Living Expenses:   Food Insecurity:    Worried About Charity fundraiser in the Last Year:    Arboriculturist in the Last Year:   Transportation Needs:    Lexicographer (Medical):    Lack of Transportation (Non-Medical):   Physical Activity:    Days of Exercise per Week:    Minutes of Exercise per Session:   Stress:    Feeling of Stress :   Social Connections:    Frequency of Communication with Friends and Family:    Frequency of Social Gatherings with Friends and Family:    Attends Religious Services:    Active Member of Clubs or Organizations:    Attends Music therapist:    Marital Status:   Intimate Partner Violence:    Fear of Current or Ex-Partner:    Emotionally Abused:    Physically Abused:    Sexually Abused:     FAMILY HISTORY: Family History  Problem Relation Age of Onset   Cirrhosis Father    Lung cancer Mother    Colon cancer Neg Hx    Pancreatic cancer Neg Hx    Rectal cancer Neg Hx    Stomach cancer Neg Hx     ALLERGIES:  is allergic to lisinopril.  MEDICATIONS:  Current Outpatient Medications  Medication Sig Dispense Refill   losartan (COZAAR) 50 MG tablet Take 50 mg by mouth daily.     metoprolol succinate (TOPROL-XL) 25 MG 24 hr tablet Take 25 mg by mouth daily.     Multiple Vitamin (MULTIVITAMIN) tablet Take 1 tablet by mouth daily.     No current facility-administered medications for this visit.    REVIEW OF SYSTEMS:   A 10+ POINT REVIEW OF SYSTEMS WAS OBTAINED including neurology, dermatology, psychiatry, cardiac, respiratory, lymph, extremities, GI, GU, Musculoskeletal, constitutional, breasts, reproductive, HEENT.  All pertinent positives are noted in the HPI.  All others are negative.   PHYSICAL EXAMINATION: ECOG PERFORMANCE STATUS: 0 - Asymptomatic  . There were no vitals filed for this visit. There were no vitals filed for this visit. .There is no height or weight on file to calculate BMI.  Tele-Health visit   LABORATORY DATA:  I have reviewed the data as listed  . CBC Latest Ref Rng & Units 04/08/2020 04/08/2020 01/23/2020  WBC 4.0 - 10.5 K/uL 4.2  - 4.1  Hemoglobin 13.0 - 17.0 g/dL 13.0 - 11.4(L)  Hematocrit 37.5 - 51.0 % 39.3 38.9(L) 33.5(L)  Platelets 150 - 400 K/uL 117(L) - 131.0(L)   . CBC    Component Value Date/Time   WBC 4.2 04/08/2020 1214   RBC 3.95 (L) 04/08/2020 1214   HGB 13.0 04/08/2020 1214   HCT 39.3 04/08/2020 1215   HCT 38.9 (L) 04/08/2020 1214   PLT 117 (L) 04/08/2020 1214   MCV 98.5 04/08/2020 1214   MCH 32.9 04/08/2020 1214   MCHC 33.4 04/08/2020 1214   RDW 13.5 04/08/2020 1214   LYMPHSABS  1.8 04/08/2020 1214   MONOABS 0.4 04/08/2020 1214   EOSABS 0.1 04/08/2020 1214   BASOSABS 0.1 04/08/2020 1214     . CMP Latest Ref Rng & Units 04/08/2020 01/23/2020 11/26/2019  Glucose 70 - 99 mg/dL 109(H) 119(H) -  BUN 8 - 23 mg/dL 9 7 -  Creatinine 0.61 - 1.24 mg/dL 0.84 0.76 -  Sodium 135 - 145 mmol/L 139 136 -  Potassium 3.5 - 5.1 mmol/L 4.3 3.9 -  Chloride 98 - 111 mmol/L 106 106 -  CO2 22 - 32 mmol/L 25 27 -  Calcium 8.9 - 10.3 mg/dL 10.3 9.3 -  Total Protein 6.5 - 8.1 g/dL 7.3 6.6 6.6  Total Bilirubin 0.3 - 1.2 mg/dL 1.5(H) 1.7(H) 2.6(H)  Alkaline Phos 38 - 126 U/L 111 93 118(H)  AST 15 - 41 U/L 29 31 54(H)  ALT 0 - 44 U/L 15 15 20    Component     Latest Ref Rng & Units 10/29/2019 01/23/2020           RBC     4.22 - 5.81 Mil/uL 2.60 (L) 3.48 (L)  Hemoglobin     13.0 - 17.0 g/dL 9.5 (L) 11.4 (L)  HCT     39 - 52 % 28.1 (L) 33.5 (L)   Component     Latest Ref Rng & Units 10/29/2019 01/23/2020           Platelets     150 - 400 K/uL 134 (L) 131.0 (L)    RADIOGRAPHIC STUDIES: I have personally reviewed the radiological images as listed and agreed with the findings in the report. No results found.  ASSESSMENT & PLAN:   74 yo with  1. Resolving macrocytic Anemia - likely related to ETOH abuse 2. Thrombocytopenia likely related to ETOH use, r/o alcohol related liver disease  PLAN: -Discussed pt labwork today, 04/08/20; of CBC w/diff and CMP is as follows: all values are WNL except for RBC at  3.95, HCT at 38.9, Platelets at 117K, Glucose at 109, Albumin at 3.4, Total Bilirubin at 1.5 -Discussed 04/08/20 of MMP is as follows: all values are WNL except for IgG at 1791, IgM at 257, Gamma Glob SerPl Elph-Mcnc at 2.0 -Discussed 04/08/20 of Folate RBC is as follows: all values are  -Discussed 04/08/20 of TSH at 1.041: WNL -Discussed 04/08/20 of Copper at 76: WNL -Discussed 04/08/20 of Iron and TIBC is as follows: all values are WNL except for Saturation Ratios at 73, UIBC at 60 -Discussed 04/08/20 of Ferritin at 200: WNL -Discussed 04/08/20 65f Vitamin D 25 Hydroxy at 21.76 -Discussed 04/08/20 of Vitamin B1 at 115.6: WNL -Discussed 04/08/20 of Vitamin B12 at 954 -Discussed 04/08/20 of Sedimentation Rate at 8: WNL -Discussed 04/08/20 of Kappa/Lambda Light Chains is as follows: all values are WNL except for Kappa Free Light Chain at 37.1 -Advised pt that his blood counts have continued to trend towards normal since February -Advised pt that excessive alcohol intake can suppress bone marrow and create nutritional deficiencies. This can suppress blood counts.  -Advised pt that the spleen may be overactive for months after liver inflammation. An overactive spleen can store blood cells, leading to them being misread as low.  -Advised pt that aggressive nutritional therapy can be useful for improving health after long-term heavy alcohol use.  -Advised pt that there can be alcohol-induced depression after quitting alcohol. Recommend pt f/u with Dr. Harrington Challenger to manage. -Advised low platelets at this point are not a risk of increase risk  of bleeding -Recommend pt use graded sports compression socks for leg swelling. -Advised pt that frequent urination at night could also be caused by BPH. -Advised unlikely primary bone disorder due to blood counts resolving after alcohol cessation  -If platelets drop below 75K will see back in clinic -Recommend OTC B-complex vitamin & 2000 UT Vitamin D daily   -Recommends continual f/u with PCP -Will see back as needed   FOLLOW UP: RTC with Dr Irene Limbo as needed  No orders of the defined types were placed in this encounter.  The total time spent in the appt was 20 minutes and more than 50% was on counseling and direct patient cares.  All of the patient's questions were answered with apparent satisfaction. The patient knows to call the clinic with any problems, questions or concerns.  Sullivan Lone MD [place] AAHIVMS [facility] Hematology/Oncology Physician [facility]  (Office):       [phone] (Work cell):  [phone] (Fax):           [phone]  04/24/2020 1:30 PM  I, Dawayne Cirri am acting as a Education administrator for Dr. Sullivan Lone.   .I have reviewed the above documentation for accuracy and completeness, and I agree with the above. Brunetta Genera MD

## 2020-04-24 ENCOUNTER — Inpatient Hospital Stay: Payer: Medicare Other | Attending: Hematology | Admitting: Hematology

## 2020-04-24 DIAGNOSIS — D696 Thrombocytopenia, unspecified: Secondary | ICD-10-CM | POA: Diagnosis not present

## 2020-04-24 DIAGNOSIS — D649 Anemia, unspecified: Secondary | ICD-10-CM

## 2020-06-29 ENCOUNTER — Other Ambulatory Visit: Payer: Self-pay | Admitting: Internal Medicine

## 2020-06-29 DIAGNOSIS — N62 Hypertrophy of breast: Secondary | ICD-10-CM

## 2020-07-20 ENCOUNTER — Other Ambulatory Visit: Payer: Self-pay

## 2020-07-20 ENCOUNTER — Ambulatory Visit
Admission: RE | Admit: 2020-07-20 | Discharge: 2020-07-20 | Disposition: A | Discharge status: Home / Self Care | Payer: Medicare Other | Source: Ambulatory Visit | Attending: Internal Medicine | Admitting: Internal Medicine

## 2020-07-20 ENCOUNTER — Ambulatory Visit: Payer: Medicare Other

## 2020-07-20 ENCOUNTER — Ambulatory Visit: Admission: RE | Admit: 2020-07-20 | Payer: Medicare Other | Source: Ambulatory Visit

## 2020-07-20 DIAGNOSIS — N62 Hypertrophy of breast: Secondary | ICD-10-CM

## 2020-09-29 ENCOUNTER — Telehealth: Payer: Self-pay | Admitting: Gastroenterology

## 2020-09-29 NOTE — Telephone Encounter (Signed)
Pt wants to know if he still needs a third Hep B injection. He stated to have had two injections and being told that he may not need a third one but he has not heard back from [place] yet. Pls call him.

## 2020-09-29 NOTE — Telephone Encounter (Signed)
Patient is due for his third Twinrix.  He has been scheduled for 10/07/20 10:30

## 2020-10-14 ENCOUNTER — Ambulatory Visit (INDEPENDENT_AMBULATORY_CARE_PROVIDER_SITE_OTHER): Payer: Medicare Other | Admitting: Gastroenterology

## 2020-10-14 DIAGNOSIS — Z23 Encounter for immunization: Secondary | ICD-10-CM | POA: Diagnosis not present

## 2020-10-26 DIAGNOSIS — M25511 Pain in right shoulder: Secondary | ICD-10-CM | POA: Diagnosis not present

## 2020-10-26 DIAGNOSIS — M25559 Pain in unspecified hip: Secondary | ICD-10-CM | POA: Diagnosis not present

## 2020-10-26 DIAGNOSIS — M549 Dorsalgia, unspecified: Secondary | ICD-10-CM | POA: Diagnosis not present

## 2020-10-26 DIAGNOSIS — M533 Sacrococcygeal disorders, not elsewhere classified: Secondary | ICD-10-CM | POA: Diagnosis not present

## 2020-10-26 DIAGNOSIS — M25519 Pain in unspecified shoulder: Secondary | ICD-10-CM | POA: Diagnosis not present

## 2020-10-26 DIAGNOSIS — M2550 Pain in unspecified joint: Secondary | ICD-10-CM | POA: Diagnosis not present

## 2020-10-26 DIAGNOSIS — M545 Low back pain, unspecified: Secondary | ICD-10-CM | POA: Diagnosis not present

## 2020-10-26 DIAGNOSIS — M1990 Unspecified osteoarthritis, unspecified site: Secondary | ICD-10-CM | POA: Diagnosis not present

## 2020-10-26 DIAGNOSIS — R634 Abnormal weight loss: Secondary | ICD-10-CM | POA: Diagnosis not present

## 2020-10-26 DIAGNOSIS — M160 Bilateral primary osteoarthritis of hip: Secondary | ICD-10-CM | POA: Diagnosis not present

## 2020-10-26 DIAGNOSIS — M19011 Primary osteoarthritis, right shoulder: Secondary | ICD-10-CM | POA: Diagnosis not present

## 2020-10-26 DIAGNOSIS — M2560 Stiffness of unspecified joint, not elsewhere classified: Secondary | ICD-10-CM | POA: Diagnosis not present

## 2020-10-26 DIAGNOSIS — M353 Polymyalgia rheumatica: Secondary | ICD-10-CM | POA: Diagnosis not present

## 2020-10-26 DIAGNOSIS — M19012 Primary osteoarthritis, left shoulder: Secondary | ICD-10-CM | POA: Diagnosis not present

## 2020-10-26 DIAGNOSIS — M25551 Pain in right hip: Secondary | ICD-10-CM | POA: Diagnosis not present

## 2020-11-20 DIAGNOSIS — M810 Age-related osteoporosis without current pathological fracture: Secondary | ICD-10-CM | POA: Diagnosis not present

## 2020-11-20 DIAGNOSIS — M549 Dorsalgia, unspecified: Secondary | ICD-10-CM | POA: Diagnosis not present

## 2020-11-20 DIAGNOSIS — M1990 Unspecified osteoarthritis, unspecified site: Secondary | ICD-10-CM | POA: Diagnosis not present

## 2020-11-20 DIAGNOSIS — M25559 Pain in unspecified hip: Secondary | ICD-10-CM | POA: Diagnosis not present

## 2020-11-20 DIAGNOSIS — M25519 Pain in unspecified shoulder: Secondary | ICD-10-CM | POA: Diagnosis not present

## 2020-11-20 DIAGNOSIS — Z13820 Encounter for screening for osteoporosis: Secondary | ICD-10-CM | POA: Diagnosis not present

## 2020-11-20 DIAGNOSIS — M353 Polymyalgia rheumatica: Secondary | ICD-10-CM | POA: Diagnosis not present

## 2020-12-16 DIAGNOSIS — L723 Sebaceous cyst: Secondary | ICD-10-CM | POA: Diagnosis not present

## 2021-01-11 DIAGNOSIS — L720 Epidermal cyst: Secondary | ICD-10-CM | POA: Diagnosis not present

## 2021-01-18 DIAGNOSIS — M25559 Pain in unspecified hip: Secondary | ICD-10-CM | POA: Diagnosis not present

## 2021-01-18 DIAGNOSIS — M549 Dorsalgia, unspecified: Secondary | ICD-10-CM | POA: Diagnosis not present

## 2021-01-18 DIAGNOSIS — M810 Age-related osteoporosis without current pathological fracture: Secondary | ICD-10-CM | POA: Diagnosis not present

## 2021-01-18 DIAGNOSIS — M1990 Unspecified osteoarthritis, unspecified site: Secondary | ICD-10-CM | POA: Diagnosis not present

## 2021-01-18 DIAGNOSIS — M353 Polymyalgia rheumatica: Secondary | ICD-10-CM | POA: Diagnosis not present

## 2021-01-18 DIAGNOSIS — M25519 Pain in unspecified shoulder: Secondary | ICD-10-CM | POA: Diagnosis not present

## 2021-01-27 DIAGNOSIS — D229 Melanocytic nevi, unspecified: Secondary | ICD-10-CM | POA: Diagnosis not present

## 2021-03-24 DIAGNOSIS — N3281 Overactive bladder: Secondary | ICD-10-CM | POA: Diagnosis not present

## 2021-04-20 DIAGNOSIS — D696 Thrombocytopenia, unspecified: Secondary | ICD-10-CM | POA: Diagnosis not present

## 2021-04-20 DIAGNOSIS — M549 Dorsalgia, unspecified: Secondary | ICD-10-CM | POA: Diagnosis not present

## 2021-04-20 DIAGNOSIS — M353 Polymyalgia rheumatica: Secondary | ICD-10-CM | POA: Diagnosis not present

## 2021-04-20 DIAGNOSIS — M810 Age-related osteoporosis without current pathological fracture: Secondary | ICD-10-CM | POA: Diagnosis not present

## 2021-04-20 DIAGNOSIS — M1990 Unspecified osteoarthritis, unspecified site: Secondary | ICD-10-CM | POA: Diagnosis not present

## 2021-04-22 DIAGNOSIS — E870 Hyperosmolality and hypernatremia: Secondary | ICD-10-CM | POA: Diagnosis not present

## 2021-05-20 DIAGNOSIS — R509 Fever, unspecified: Secondary | ICD-10-CM | POA: Diagnosis not present

## 2021-07-13 DIAGNOSIS — E7800 Pure hypercholesterolemia, unspecified: Secondary | ICD-10-CM | POA: Diagnosis not present

## 2021-07-13 DIAGNOSIS — D696 Thrombocytopenia, unspecified: Secondary | ICD-10-CM | POA: Diagnosis not present

## 2021-07-13 DIAGNOSIS — I10 Essential (primary) hypertension: Secondary | ICD-10-CM | POA: Diagnosis not present

## 2021-07-19 DIAGNOSIS — E7800 Pure hypercholesterolemia, unspecified: Secondary | ICD-10-CM | POA: Diagnosis not present

## 2021-07-19 DIAGNOSIS — Z23 Encounter for immunization: Secondary | ICD-10-CM | POA: Diagnosis not present

## 2021-07-19 DIAGNOSIS — D179 Benign lipomatous neoplasm, unspecified: Secondary | ICD-10-CM | POA: Diagnosis not present

## 2021-07-19 DIAGNOSIS — I10 Essential (primary) hypertension: Secondary | ICD-10-CM | POA: Diagnosis not present

## 2021-07-19 DIAGNOSIS — Z87898 Personal history of other specified conditions: Secondary | ICD-10-CM | POA: Diagnosis not present

## 2021-07-19 DIAGNOSIS — Z0000 Encounter for general adult medical examination without abnormal findings: Secondary | ICD-10-CM | POA: Diagnosis not present

## 2021-07-28 DIAGNOSIS — M1990 Unspecified osteoarthritis, unspecified site: Secondary | ICD-10-CM | POA: Diagnosis not present

## 2021-07-28 DIAGNOSIS — M353 Polymyalgia rheumatica: Secondary | ICD-10-CM | POA: Diagnosis not present

## 2021-07-28 DIAGNOSIS — D696 Thrombocytopenia, unspecified: Secondary | ICD-10-CM | POA: Diagnosis not present

## 2021-07-28 DIAGNOSIS — M810 Age-related osteoporosis without current pathological fracture: Secondary | ICD-10-CM | POA: Diagnosis not present

## 2021-07-28 DIAGNOSIS — M549 Dorsalgia, unspecified: Secondary | ICD-10-CM | POA: Diagnosis not present

## 2021-08-18 DIAGNOSIS — D1724 Benign lipomatous neoplasm of skin and subcutaneous tissue of left leg: Secondary | ICD-10-CM | POA: Diagnosis not present

## 2021-10-11 ENCOUNTER — Other Ambulatory Visit: Payer: Self-pay | Admitting: General Surgery

## 2021-10-11 DIAGNOSIS — D1724 Benign lipomatous neoplasm of skin and subcutaneous tissue of left leg: Secondary | ICD-10-CM | POA: Diagnosis not present

## 2021-10-11 DIAGNOSIS — D171 Benign lipomatous neoplasm of skin and subcutaneous tissue of trunk: Secondary | ICD-10-CM | POA: Diagnosis not present

## 2021-12-02 DIAGNOSIS — M1990 Unspecified osteoarthritis, unspecified site: Secondary | ICD-10-CM | POA: Diagnosis not present

## 2021-12-02 DIAGNOSIS — M549 Dorsalgia, unspecified: Secondary | ICD-10-CM | POA: Diagnosis not present

## 2021-12-02 DIAGNOSIS — M353 Polymyalgia rheumatica: Secondary | ICD-10-CM | POA: Diagnosis not present

## 2021-12-02 DIAGNOSIS — M810 Age-related osteoporosis without current pathological fracture: Secondary | ICD-10-CM | POA: Diagnosis not present

## 2021-12-02 DIAGNOSIS — D696 Thrombocytopenia, unspecified: Secondary | ICD-10-CM | POA: Diagnosis not present

## 2022-01-24 IMAGING — MG DIGITAL DIAGNOSTIC BILAT W/ TOMO W/ CAD
6 of 12 series · 6 of 36 positions shown · non-contrast
Comparison: None.

CLINICAL DATA: Patient presents with bilateral breast swelling and
associated tenderness, initially beginning on the right been
subsequently also involving the left, currently both breasts showing
some improvement.

EXAM:
DIGITAL DIAGNOSTIC BILATERAL MAMMOGRAM WITH TOMO AND CAD

[R TAN synth-2D]
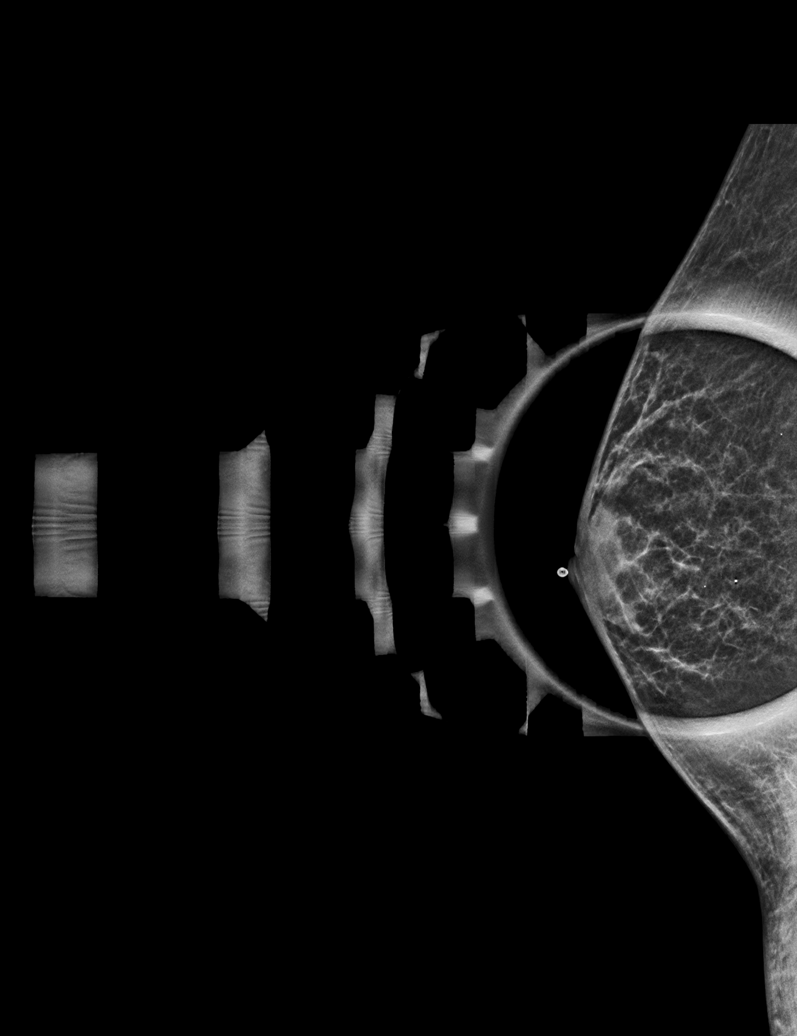

[R CC synth-2D]
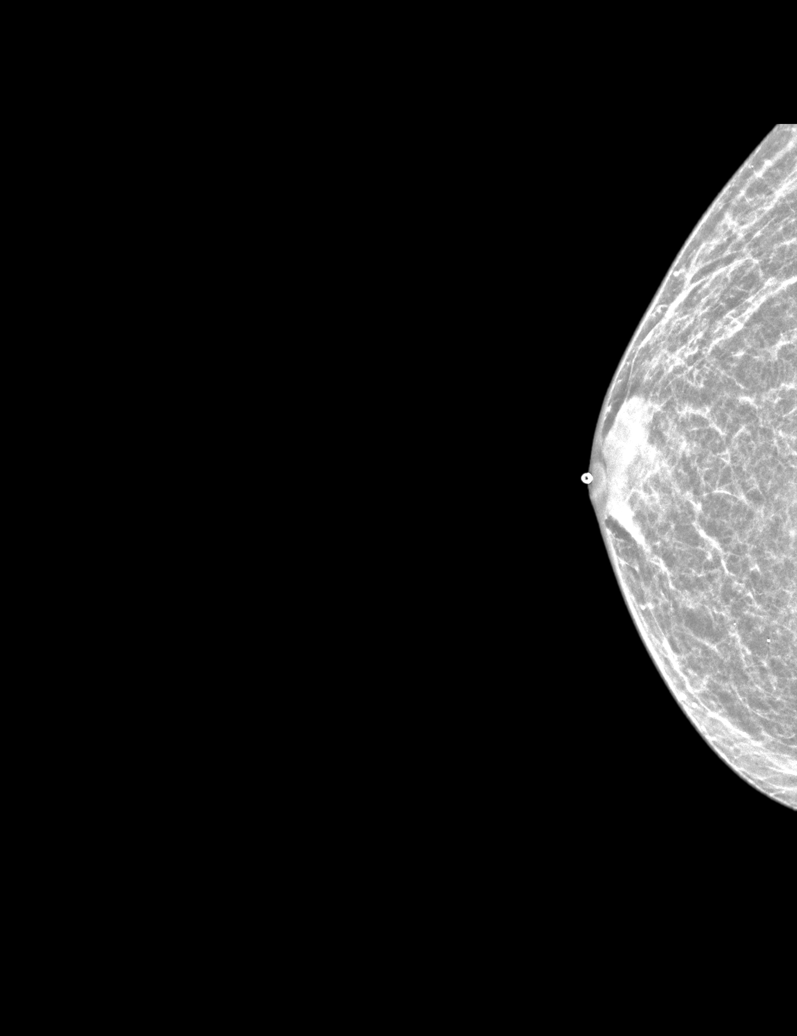

[R MLO synth-2D]
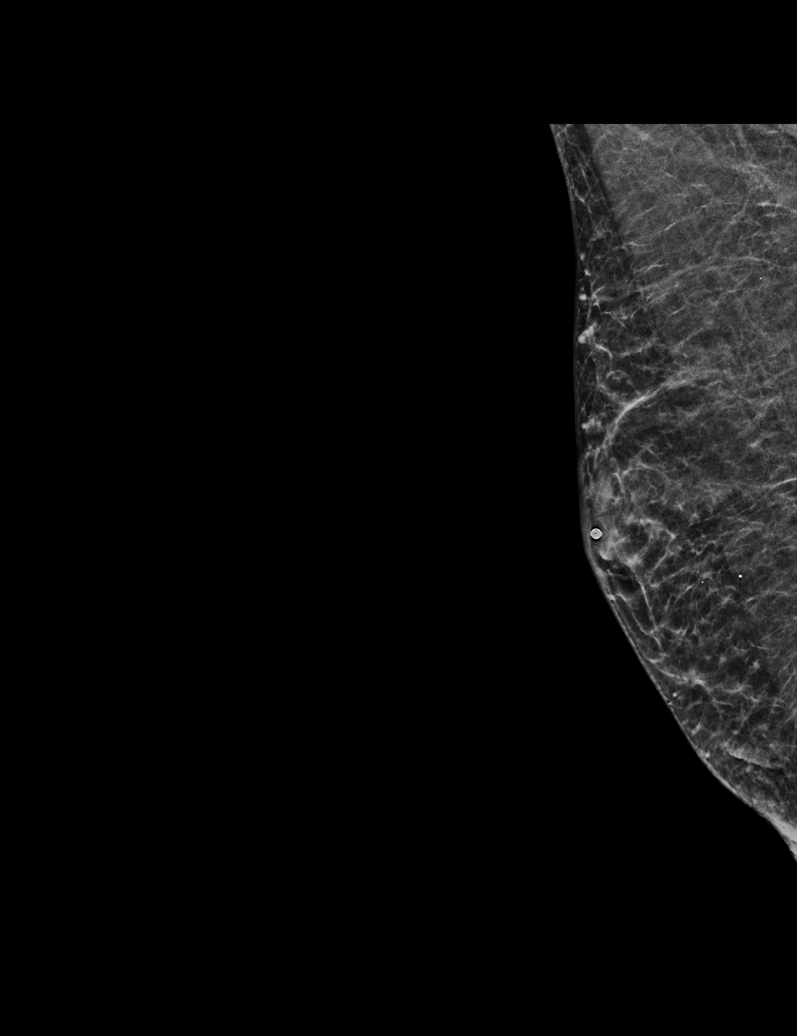

[L CC synth-2D]
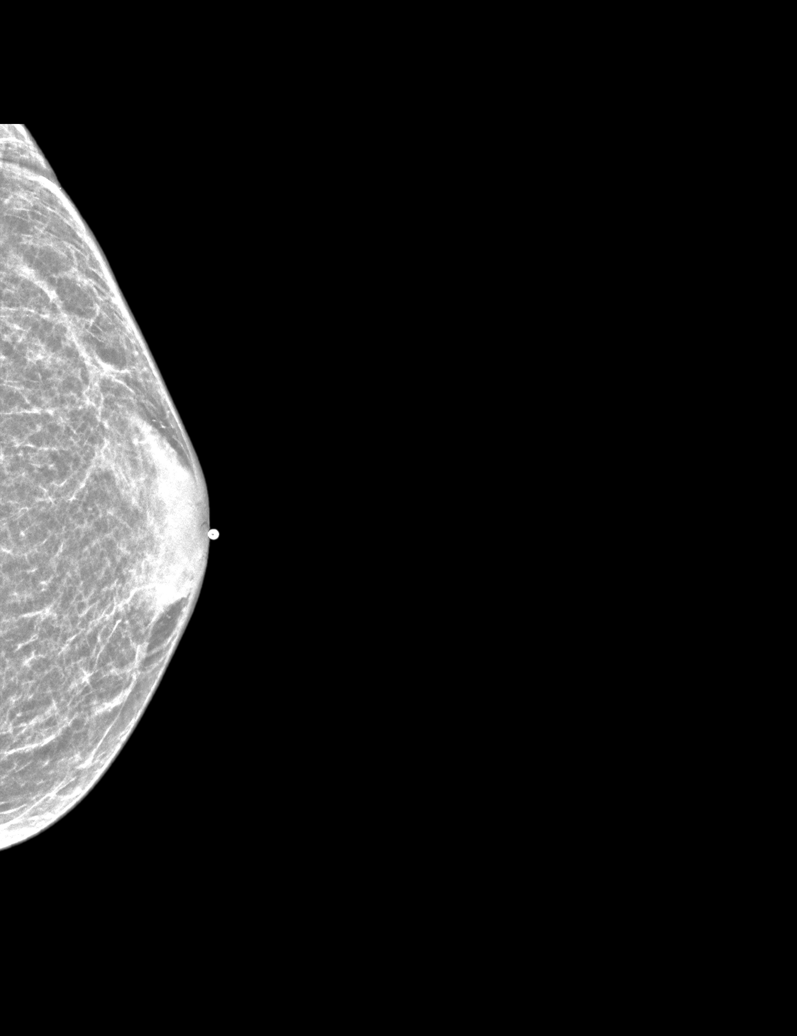

[L TAN synth-2D]
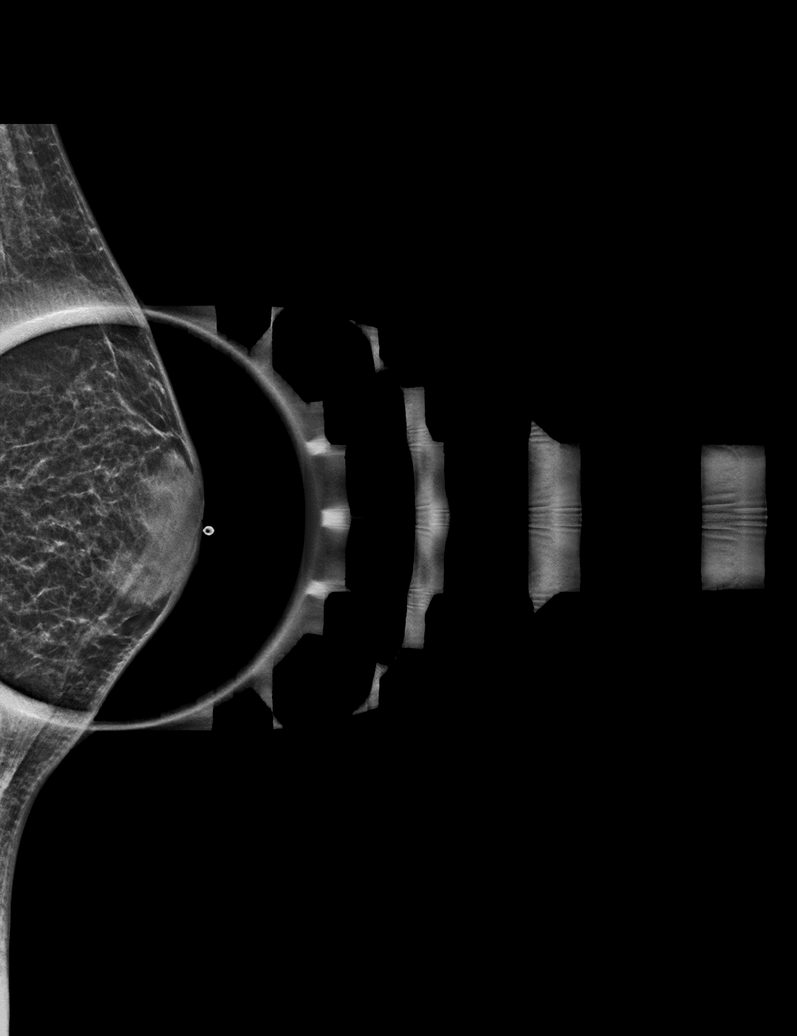

[L MLO synth-2D]
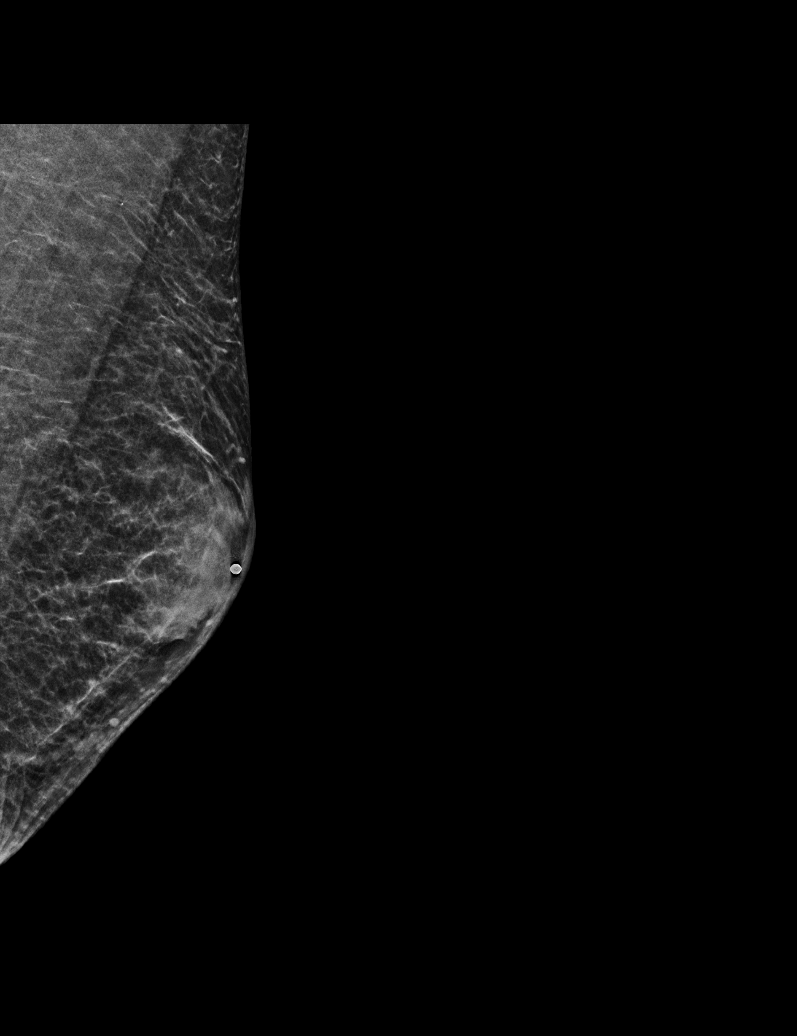

[6 of 36 positions shown; findings below may reference images not displayed]

ACR Breast Density Category b: There are scattered areas of
fibroglandular density.
FINDINGS: There is increased attenuation in the retroareolar regions of both
breast, left greater than right, in a pattern characteristic of
gynecomastia. There are no masses, no areas of architectural
distortion and no suspicious calcifications.

Physical examination: There is retroareolar bilateral breast
prominence, left greater than right, with mild tenderness on the
left. No defined masses.

Mammographic images were processed with CAD.
IMPRESSION: Benign bilateral gynecomastia.  No evidence of breast malignancy.

RECOMMENDATION:
Clinical follow-up for the benign bilateral gynecomastia.

I have discussed the findings and recommendations with the patient.
If applicable, a reminder letter will be sent to the patient
regarding the next appointment.

BI-RADS CATEGORY  2: Benign.

## 2022-01-26 DIAGNOSIS — N63 Unspecified lump in unspecified breast: Secondary | ICD-10-CM | POA: Diagnosis not present

## 2022-02-11 DIAGNOSIS — N62 Hypertrophy of breast: Secondary | ICD-10-CM | POA: Diagnosis not present

## 2022-05-19 DIAGNOSIS — D6869 Other thrombophilia: Secondary | ICD-10-CM | POA: Diagnosis not present

## 2022-05-19 DIAGNOSIS — I4891 Unspecified atrial fibrillation: Secondary | ICD-10-CM | POA: Diagnosis not present

## 2022-06-02 DIAGNOSIS — M549 Dorsalgia, unspecified: Secondary | ICD-10-CM | POA: Diagnosis not present

## 2022-06-02 DIAGNOSIS — M1990 Unspecified osteoarthritis, unspecified site: Secondary | ICD-10-CM | POA: Diagnosis not present

## 2022-06-02 DIAGNOSIS — M353 Polymyalgia rheumatica: Secondary | ICD-10-CM | POA: Diagnosis not present

## 2022-06-02 DIAGNOSIS — M810 Age-related osteoporosis without current pathological fracture: Secondary | ICD-10-CM | POA: Diagnosis not present

## 2022-06-02 DIAGNOSIS — D696 Thrombocytopenia, unspecified: Secondary | ICD-10-CM | POA: Diagnosis not present

## 2022-06-05 DIAGNOSIS — I4891 Unspecified atrial fibrillation: Secondary | ICD-10-CM | POA: Insufficient documentation

## 2022-06-05 NOTE — Progress Notes (Unsigned)
Cardiology Office Note   Date:  06/06/2022   ID:  Richard Irwin, DOB February 21, 1946, MRN [id number]  PCP:  Lawerance Cruel, MD  Cardiologist:   None Referring:  Lawerance Cruel, MD   Chief Complaint  Patient presents with   Atrial Fibrillation      History of Present Illness: Richard Irwin is a 75 y.o. male who presents for evaluation of atrial fib.  He is referred by Lawerance Cruel, MD       Echo in 2021 demonstrated an EF of 50 - 55%.   There was moderate to  severe aortic enlargement measuring 47 mm.  This was at the time of syncope when he was hospitalized.  This turned out to be related to alcohol intoxication.  He does not drink any longer.  He said recently he was at the [place] and found incidentally to be in atrial fibrillation.  He would not have known this.  He did not feel it.  He does not have palpitations.  He does not have presyncope or syncope.  He has had fatigue.  He has had some increasing dyspnea with exertion such as doing things quickly around his yard.  He is not describing PND or orthopnea.  He is not having any chest pressure, neck or arm discomfort.  He went back to see Dr. Harrington Challenger and was started on Eliquis and beta-blocker.  I do see some blood work from the [place] that was unremarkable except that there was not a TSH drawn.   Past Medical History:  Diagnosis Date   Acute blood loss anemia    Anemia    Diverticulosis    Emphysema/COPD ([place])    Esophageal varices (HCC)    H/O: GI bleed    Hypertension    Insomnia    Status post colonoscopy with polypectomy    with bleed   Thoracic ascending aortic aneurysm ([place]) 05/07/2013   Tubular adenoma     Past Surgical History:  Procedure Laterality Date   APPENDECTOMY     COLONOSCOPY WITH PROPOFOL N/A 07/09/2014   Procedure: COLONOSCOPY WITH PROPOFOL;  Surgeon: Irene Shipper, MD;  Location: [facility];  Service: Endoscopy;  Laterality: N/A;   EXCISIONAL HEMORRHOIDECTOMY     HERNIA REPAIR      ROTATOR CUFF REPAIR       Current Outpatient Medications  Medication Sig Dispense Refill   alendronate (FOSAMAX) 70 MG tablet Take 1 tablet by mouth once a week.     apixaban (ELIQUIS) 5 MG TABS tablet 1 tablet Orally Twice a day     losartan (COZAAR) 50 MG tablet Take 50 mg by mouth daily.     metoprolol succinate (TOPROL-XL) 25 MG 24 hr tablet Take 25 mg by mouth daily.     No current facility-administered medications for this visit.    Allergies:   Lisinopril    Social History:  The patient  reports that he has never smoked. He has never used smokeless tobacco. He reports that he does not currently use alcohol. He reports that he does not use drugs.   Family History:  The patient's family history includes Cirrhosis in his father; Lung cancer in his mother.    ROS:  Please see the history of present illness.   Otherwise, review of systems are positive for none.   All other systems are reviewed and negative.    PHYSICAL EXAM: VS:  BP (!) 142/88   Pulse (!) 105  Ht 6' (1.829 m)   Wt 168 lb 9.6 oz (76.5 kg)   SpO2 93%   BMI 22.87 kg/m  , BMI Body mass index is 22.87 kg/m. GENERAL:  Well appearing HEENT:  Pupils equal round and reactive, fundi not visualized, oral mucosa unremarkable NECK:  No jugular venous distention, waveform within normal limits, carotid upstroke brisk and symmetric, no bruits, no thyromegaly LYMPHATICS:  No cervical, inguinal adenopathy LUNGS:  Clear to auscultation bilaterally BACK:  No CVA tenderness CHEST:  Unremarkable HEART:  PMI not displaced or sustained,S1 and S2 within normal limits, no S3, no clicks, no rubs, no murmurs, irregular ABD:  Flat, positive bowel sounds normal in frequency in pitch, no bruits, no rebound, no guarding, no midline pulsatile mass, no hepatomegaly, no splenomegaly EXT:  2 plus pulses throughout, no edema, no cyanosis no clubbing SKIN:  No rashes no nodules NEURO:  Cranial nerves II through XII grossly intact, motor  grossly intact throughout PSYCH:  Cognitively intact, oriented to person place and time    EKG:  EKG is ordered today. The ekg ordered today demonstrates atrial fibrillation, rate 105, left axis deviation, interventricular conduction delay borderline, premature ventricular contractions, no acute ST-T wave changes, QTc is slightly prolonged   Recent Labs: No results found for requested labs within last 365 days.    Lipid Panel No results found for: "CHOL", "TRIG", "HDL", "CHOLHDL", "VLDL", "LDLCALC", "LDLDIRECT"    Wt Readings from Last 3 Encounters:  06/06/22 168 lb 9.6 oz (76.5 kg)  04/08/20 163 lb 3.2 oz (74 kg)  01/23/20 165 lb (74.8 kg)      Other studies Reviewed: Additional studies/ records that were reviewed today include: Labs from the [place] and primary care notes. Review of the above records demonstrates:  Please see elsewhere in the note.  See elsewhere   ASSESSMENT AND PLAN:  Atrial Fib: The patient has asymptomatic atrial fibrillation.  I am going to apply a 3-day Zio patch to make sure that this is persistent.  He will remain on the anticoagulation.  I will look at the rate control on this monitor and he will also get a wearable.  I will plan cardioversion.  Subsequently he might be a candidate for ablation in the future if he has recurrence versus treatment with an antiarrhythmic.  I will check a TSH today.  He will remain on the current dose of beta-blocker for now.  He has no contraindication anticoagulation.  CHA2DS2-VASc score is 3  Aortic root enlargement: I will check a CT angiogram of his aorta.  HTN: His blood pressure is mildly elevated but will be controlled in the context of managing his rate.  Current medicines are reviewed at length with the patient today.  The patient does not have concerns regarding medicines.  The following changes have been made:  no change  Labs/ tests ordered today include:   Orders Placed This Encounter  Procedures   CT  ANGIO CHEST AORTA W/CM & OR WO/CM   TSH   LONG TERM MONITOR (3-14 DAYS)   EKG 12-Lead   ECHOCARDIOGRAM COMPLETE     Disposition:   FU with me in 3 weeks.   Signed, [name], MD  06/06/2022 10:23 AM    [place]

## 2022-06-06 ENCOUNTER — Encounter: Payer: Self-pay | Admitting: Cardiology

## 2022-06-06 ENCOUNTER — Ambulatory Visit: Payer: Medicare Other | Attending: Cardiology | Admitting: Cardiology

## 2022-06-06 ENCOUNTER — Ambulatory Visit (INDEPENDENT_AMBULATORY_CARE_PROVIDER_SITE_OTHER): Payer: Medicare Other

## 2022-06-06 VITALS — BP 142/88 | HR 105 | Ht 72.0 in | Wt 168.6 lb

## 2022-06-06 DIAGNOSIS — I4891 Unspecified atrial fibrillation: Secondary | ICD-10-CM

## 2022-06-06 DIAGNOSIS — I7121 Aneurysm of the ascending aorta, without rupture: Secondary | ICD-10-CM | POA: Diagnosis not present

## 2022-06-06 LAB — TSH: TSH: 1.83 u[IU]/mL (ref 0.450–4.500)

## 2022-06-06 NOTE — Patient Instructions (Signed)
Medication Instructions:  The current medical regimen is effective;  continue present plan and medications.  *If you need a refill on your cardiac medications before your next appointment, please call your pharmacy*   Lab Work: TSH today   If you have labs (blood work) drawn today and your tests are completely normal, you will receive your results only by: [place] (if you have MyChart) OR A paper copy in the mail If you have any lab test that is abnormal or we need to change your treatment, we will call you to review the results.   Testing/Procedures: Echocardiogram - Your physician has requested that you have an echocardiogram. Echocardiography is a painless test that uses sound waves to create images of your heart. It provides your doctor with information about the size and shape of your heart and how well your heart's chambers and valves are working. This procedure takes approximately one hour. There are no restrictions for this procedure.   CTA AORTA  ZIO XT- Long Term Monitor Instructions  Your physician has requested you wear a ZIO patch monitor for 3 days.  This is a single patch monitor. [employer] supplies one patch monitor per enrollment. Additional stickers are not available. Please do not apply patch if you will be having a Nuclear Stress Test,  Echocardiogram, Cardiac CT, MRI, or Chest Xray during the period you would be wearing the  monitor. The patch cannot be worn during these tests. You cannot remove and re-apply the  ZIO XT patch monitor.  Your ZIO patch monitor will be mailed 3 day USPS to your address on file. It may take 3-5 days  to receive your monitor after you have been enrolled.  Once you have received your monitor, please review the enclosed instructions. Your monitor  has already been registered assigning a specific monitor serial # to you.  Billing and Patient Assistance Program Information  We have supplied [employer] with any of your insurance  information on file for billing purposes. [employer] offers a sliding scale Patient Assistance Program for patients that do not have  insurance, or whose insurance does not completely cover the cost of the ZIO monitor.  You must apply for the Patient Assistance Program to qualify for this discounted rate.  To apply, please call [employer] at [phone], select option 4, select option 2, ask to apply for  Patient Assistance Program. Theodore Demark will ask your household income, and how many people  are in your household. They will quote your out-of-pocket cost based on that information.  [employer] will also be able to set up a 72-month, interest-free payment plan if needed.  Applying the monitor   Shave hair from upper left chest.  Hold abrader disc by orange tab. Rub abrader in 40 strokes over the upper left chest as  indicated in your monitor instructions.  Clean area with 4 enclosed alcohol pads. Let dry.  Apply patch as indicated in monitor instructions. Patch will be placed under collarbone on left  side of chest with arrow pointing upward.  Rub patch adhesive wings for 2 minutes. Remove white label marked "1". Remove the white  label marked "2". Rub patch adhesive wings for 2 additional minutes.  While looking in a mirror, press and release button in center of patch. A small green light will  flash 3-4 times. This will be your only indicator that the monitor has been turned on.  Do not shower for the first 24 hours. You may shower after the first 24 hours.  Press the button if you feel a symptom. You will hear a small click. Record Date, Time and  Symptom in the Patient Logbook.  When you are ready to remove the patch, follow instructions on the last 2 pages of Patient  Logbook. Stick patch monitor onto the last page of Patient Logbook.  Place Patient Logbook in the blue and white box. Use locking tab on box and tape box closed  securely. The blue and white box has prepaid postage on it. Please  place it in the mailbox as  soon as possible. Your physician should have your test results approximately 7 days after the  monitor has been mailed back to [facility].  Call [place] at [phone] if you have questions regarding  your ZIO XT patch monitor. Call them immediately if you see an orange light blinking on your  monitor.  If your monitor falls off in less than 4 days, contact our Monitor department at [phone].  If your monitor becomes loose or falls off after 4 days call [employer] at [phone] for  suggestions on securing your monitor    Follow-Up: At [facility], you and your health needs are our priority.  As part of our continuing mission to provide you with exceptional heart care, we have created designated Provider Care Teams.  These Care Teams include your primary Cardiologist (physician) and Advanced Practice Providers (APPs -  Physician Assistants and Nurse Practitioners) who all work together to provide you with the care you need, when you need it.  We recommend signing up for the patient portal called "MyChart".  Sign up information is provided on this After Visit Summary.  MyChart is used to connect with patients for Virtual Visits (Telemedicine).  Patients are able to view lab/test results, encounter notes, upcoming appointments, etc.  Non-urgent messages can be sent to your provider as well.   To learn more about what you can do with MyChart, go to [email or web address].    Your next appointment:   3 week(s)  The format for your next appointment:   In Person  Provider:   [name], MD

## 2022-06-06 NOTE — Progress Notes (Unsigned)
z

## 2022-06-09 DIAGNOSIS — I4891 Unspecified atrial fibrillation: Secondary | ICD-10-CM

## 2022-06-13 ENCOUNTER — Encounter: Payer: Self-pay | Admitting: *Deleted

## 2022-06-16 DIAGNOSIS — I4891 Unspecified atrial fibrillation: Secondary | ICD-10-CM | POA: Diagnosis not present

## 2022-06-20 ENCOUNTER — Ambulatory Visit (HOSPITAL_COMMUNITY): Payer: Medicare Other | Attending: Cardiology

## 2022-06-20 DIAGNOSIS — I4891 Unspecified atrial fibrillation: Secondary | ICD-10-CM | POA: Diagnosis not present

## 2022-06-20 LAB — ECHOCARDIOGRAM COMPLETE: S' Lateral: 3.6 cm

## 2022-06-21 ENCOUNTER — Ambulatory Visit
Admission: RE | Admit: 2022-06-21 | Discharge: 2022-06-21 | Disposition: A | Discharge status: Home / Self Care | Payer: Medicare Other | Source: Ambulatory Visit | Attending: Cardiology | Admitting: Cardiology

## 2022-06-21 DIAGNOSIS — I7121 Aneurysm of the ascending aorta, without rupture: Secondary | ICD-10-CM

## 2022-06-21 DIAGNOSIS — I712 Thoracic aortic aneurysm, without rupture, unspecified: Secondary | ICD-10-CM | POA: Diagnosis not present

## 2022-06-21 MED ORDER — IOPAMIDOL (ISOVUE-370) INJECTION 76%
75.0000 mL | Freq: Once | INTRAVENOUS | Status: AC | PRN
  Administered 2022-06-21: 75 mL via INTRAVENOUS

## 2022-06-26 NOTE — H&P (View-Only) (Signed)
Cardiology Office Note   Date:  06/27/2022   ID:  Richard Irwin, DOB May 01, 1946, MRN [id number]  PCP:  Lawerance Cruel, MD  Cardiologist:   None Referring:  Lawerance Cruel, MD   Chief Complaint  Patient presents with   Atrial Fibrillation      History of Present Illness: Richard Irwin is a 76 y.o. male who presents for evaluation of atrial fib.  He is referred by Lawerance Cruel, MD     Echo in 2021 demonstrated an EF of 50 - 55%.   There was moderate to  severe aortic enlargement measuring 47 mm.  This was at the time of syncope when he was hospitalized.  This turned out to be related to alcohol intoxication.  He does not drink any longer.  He has had atrial fib.  He had good rate control on Zio patch.  He does have a slightly reduced ejection fraction on echocardiography.  His EF had been 50 to 55% in 2021.  Now it is 35 to 40%.  Left atrial size is mildly enlarged.  There is some mild aortic insufficiency.  Of note his CT was done last week and his aorta size is stable.  He is really not feeling his fibrillation.  He brings me a heart rate diary and his pulse is in the 60s to 80s.  He denies any presyncope or syncope.  Has had no new chest pressure, neck or arm discomfort.  He said no weight gain or edema.  He is tolerating anticoagulation.   Past Medical History:  Diagnosis Date   Acute blood loss anemia    Anemia    Diverticulosis    Emphysema/COPD ([place])    Esophageal varices (HCC)    H/O: GI bleed    Hypertension    Insomnia    Status post colonoscopy with polypectomy    with bleed   Thoracic ascending aortic aneurysm ([place]) 05/07/2013   Tubular adenoma     Past Surgical History:  Procedure Laterality Date   APPENDECTOMY     COLONOSCOPY WITH PROPOFOL N/A 07/09/2014   Procedure: COLONOSCOPY WITH PROPOFOL;  Surgeon: Irene Shipper, MD;  Location: [facility];  Service: Endoscopy;  Laterality: N/A;   EXCISIONAL HEMORRHOIDECTOMY     HERNIA REPAIR      ROTATOR CUFF REPAIR       Current Outpatient Medications  Medication Sig Dispense Refill   alendronate (FOSAMAX) 70 MG tablet Take 1 tablet by mouth once a week.     apixaban (ELIQUIS) 5 MG TABS tablet 1 tablet Orally Twice a day     metoprolol succinate (TOPROL-XL) 25 MG 24 hr tablet Take 25 mg by mouth daily.     losartan (COZAAR) 100 MG tablet Take 1 tablet (100 mg total) by mouth daily. 90 tablet 3   No current facility-administered medications for this visit.    Allergies:   Lisinopril    ROS:  Please see the history of present illness.   Otherwise, review of systems are positive for none.   All other systems are reviewed and negative.    PHYSICAL EXAM: VS:  BP (!) 139/98   Pulse 100   Ht 6' (1.829 m)   Wt 170 lb (77.1 kg)   SpO2 98%   BMI 23.06 kg/m  , BMI Body mass index is 23.06 kg/m. GENERAL:  Well appearing NECK:  No jugular venous distention, waveform within normal limits, carotid upstroke brisk and symmetric, no bruits,  no thyromegaly LUNGS:  Clear to auscultation bilaterally CHEST:  Unremarkable HEART:  PMI not displaced or sustained,S1 and S2 within normal limits, no S3,  no clicks, no rubs, no murmurs, irregular ABD:  Flat, positive bowel sounds normal in frequency in pitch, no bruits, no rebound, no guarding, no midline pulsatile mass, no hepatomegaly, no splenomegaly EXT:  2 plus pulses throughout, no edema, no cyanosis no clubbing   EKG:  EKG is  ordered today. The ekg ordered today demonstrates atrial fibrillation, rate 100, left axis deviation, interventricular conduction delay borderline, premature ventricular contractions, no acute ST-T wave changes, QTc is slightly prolonged.  PVCs   Recent Labs: 06/06/2022: TSH 1.830    Lipid Panel No results found for: "CHOL", "TRIG", "HDL", "CHOLHDL", "VLDL", "LDLCALC", "LDLDIRECT"    Wt Readings from Last 3 Encounters:  06/27/22 170 lb (77.1 kg)  06/06/22 168 lb 9.6 oz (76.5 kg)  04/08/20 163 lb 3.2 oz  (74 kg)      Other studies Reviewed: Additional studies/ records that were reviewed today include: Echo, monitor, CT Review of the above records demonstrates:  Please see elsewhere in the note.  See elsewhere   ASSESSMENT AND PLAN:  Atrial Fib: The patient has asymptomatic atrial fibrillation.  Richard Irwin has a CHA2DS2 - VASc score of 3.  He had good rate control on monitor.  We will plan cardioversion.  In the future if he has recurrence would be a candidate for ablation.  He has been taking his anticoagulation now consistently for 5 weeks.  Aortic root enlargement:   This was 45 mm on CT last week.  I will follow this again in 1 year.   HTN: His blood pressure is elevated and I will increase his Cozaar to 100 mg daily.   Cardiomyopathy: I think this is probably related to the atrial fibrillation and we will titrate the ACE inhibitor as above.  I might consider switching to [facility].  I wait to see what his resting heart rate is before increasing his beta-blocker.  We will pursue goal-directed medical therapy.  He has class I symptoms.  Current medicines are reviewed at length with the patient today.  The patient does not have concerns regarding medicines.  The following changes have been made: As above  Labs/ tests ordered today include:   Orders Placed This Encounter  Procedures   Basic metabolic panel   CBC   EKG 12-Lead     Disposition:   FU with me after the cardioversion.    Signed, [name], MD  06/27/2022 11:18 AM    [place]

## 2022-06-26 NOTE — Progress Notes (Unsigned)
Cardiology Office Note   Date:  06/27/2022   ID:  Richard Irwin, DOB 1946/09/19, MRN [id number]  PCP:  Lawerance Cruel, MD  Cardiologist:   None Referring:  Lawerance Cruel, MD   Chief Complaint  Patient presents with   Atrial Fibrillation      History of Present Illness: Richard Irwin is a 76 y.o. male who presents for evaluation of atrial fib.  He is referred by Lawerance Cruel, MD     Echo in 2021 demonstrated an EF of 50 - 55%.   There was moderate to  severe aortic enlargement measuring 47 mm.  This was at the time of syncope when he was hospitalized.  This turned out to be related to alcohol intoxication.  He does not drink any longer.  He has had atrial fib.  He had good rate control on Zio patch.  He does have a slightly reduced ejection fraction on echocardiography.  His EF had been 50 to 55% in 2021.  Now it is 35 to 40%.  Left atrial size is mildly enlarged.  There is some mild aortic insufficiency.  Of note his CT was done last week and his aorta size is stable.  He is really not feeling his fibrillation.  He brings me a heart rate diary and his pulse is in the 60s to 80s.  He denies any presyncope or syncope.  Has had no new chest pressure, neck or arm discomfort.  He said no weight gain or edema.  He is tolerating anticoagulation.   Past Medical History:  Diagnosis Date   Acute blood loss anemia    Anemia    Diverticulosis    Emphysema/COPD ([place])    Esophageal varices (HCC)    H/O: GI bleed    Hypertension    Insomnia    Status post colonoscopy with polypectomy    with bleed   Thoracic ascending aortic aneurysm ([place]) 05/07/2013   Tubular adenoma     Past Surgical History:  Procedure Laterality Date   APPENDECTOMY     COLONOSCOPY WITH PROPOFOL N/A 07/09/2014   Procedure: COLONOSCOPY WITH PROPOFOL;  Surgeon: Irene Shipper, MD;  Location: [facility];  Service: Endoscopy;  Laterality: N/A;   EXCISIONAL HEMORRHOIDECTOMY     HERNIA REPAIR      ROTATOR CUFF REPAIR       Current Outpatient Medications  Medication Sig Dispense Refill   alendronate (FOSAMAX) 70 MG tablet Take 1 tablet by mouth once a week.     apixaban (ELIQUIS) 5 MG TABS tablet 1 tablet Orally Twice a day     metoprolol succinate (TOPROL-XL) 25 MG 24 hr tablet Take 25 mg by mouth daily.     losartan (COZAAR) 100 MG tablet Take 1 tablet (100 mg total) by mouth daily. 90 tablet 3   No current facility-administered medications for this visit.    Allergies:   Lisinopril    ROS:  Please see the history of present illness.   Otherwise, review of systems are positive for none.   All other systems are reviewed and negative.    PHYSICAL EXAM: VS:  BP (!) 139/98   Pulse 100   Ht 6' (1.829 m)   Wt 170 lb (77.1 kg)   SpO2 98%   BMI 23.06 kg/m  , BMI Body mass index is 23.06 kg/m. GENERAL:  Well appearing NECK:  No jugular venous distention, waveform within normal limits, carotid upstroke brisk and symmetric, no bruits,  no thyromegaly LUNGS:  Clear to auscultation bilaterally CHEST:  Unremarkable HEART:  PMI not displaced or sustained,S1 and S2 within normal limits, no S3,  no clicks, no rubs, no murmurs, irregular ABD:  Flat, positive bowel sounds normal in frequency in pitch, no bruits, no rebound, no guarding, no midline pulsatile mass, no hepatomegaly, no splenomegaly EXT:  2 plus pulses throughout, no edema, no cyanosis no clubbing   EKG:  EKG is  ordered today. The ekg ordered today demonstrates atrial fibrillation, rate 100, left axis deviation, interventricular conduction delay borderline, premature ventricular contractions, no acute ST-T wave changes, QTc is slightly prolonged.  PVCs   Recent Labs: 06/06/2022: TSH 1.830    Lipid Panel No results found for: "CHOL", "TRIG", "HDL", "CHOLHDL", "VLDL", "LDLCALC", "LDLDIRECT"    Wt Readings from Last 3 Encounters:  06/27/22 170 lb (77.1 kg)  06/06/22 168 lb 9.6 oz (76.5 kg)  04/08/20 163 lb 3.2 oz  (74 kg)      Other studies Reviewed: Additional studies/ records that were reviewed today include: Echo, monitor, CT Review of the above records demonstrates:  Please see elsewhere in the note.  See elsewhere   ASSESSMENT AND PLAN:  Atrial Fib: The patient has asymptomatic atrial fibrillation.  [name] has a CHA2DS2 - VASc score of 3.  He had good rate control on monitor.  We will plan cardioversion.  In the future if he has recurrence would be a candidate for ablation.  He has been taking his anticoagulation now consistently for 5 weeks.  Aortic root enlargement:   This was 45 mm on CT last week.  I will follow this again in 1 year.   HTN: His blood pressure is elevated and I will increase his Cozaar to 100 mg daily.   Cardiomyopathy: I think this is probably related to the atrial fibrillation and we will titrate the ACE inhibitor as above.  I might consider switching to [facility].  I wait to see what his resting heart rate is before increasing his beta-blocker.  We will pursue goal-directed medical therapy.  He has class I symptoms.  Current medicines are reviewed at length with the patient today.  The patient does not have concerns regarding medicines.  The following changes have been made: As above  Labs/ tests ordered today include:   Orders Placed This Encounter  Procedures   Basic metabolic panel   CBC   EKG 12-Lead     Disposition:   FU with me after the cardioversion.    Signed, [name], MD  06/27/2022 11:18 AM    [place]

## 2022-06-27 ENCOUNTER — Ambulatory Visit: Payer: No Typology Code available for payment source | Attending: Cardiology | Admitting: Cardiology

## 2022-06-27 ENCOUNTER — Other Ambulatory Visit: Payer: Self-pay

## 2022-06-27 ENCOUNTER — Encounter: Payer: Self-pay | Admitting: Cardiology

## 2022-06-27 VITALS — BP 139/98 | HR 100 | Ht 72.0 in | Wt 170.0 lb

## 2022-06-27 DIAGNOSIS — I10 Essential (primary) hypertension: Secondary | ICD-10-CM

## 2022-06-27 DIAGNOSIS — I7789 Other specified disorders of arteries and arterioles: Secondary | ICD-10-CM

## 2022-06-27 DIAGNOSIS — I4819 Other persistent atrial fibrillation: Secondary | ICD-10-CM | POA: Diagnosis not present

## 2022-06-27 MED ORDER — LOSARTAN POTASSIUM 100 MG PO TABS: 100.0000 mg | ORAL_TABLET | Freq: Every day | ORAL | 3 refills | Status: DC

## 2022-06-27 NOTE — Patient Instructions (Signed)
Medication Instructions:   -Increase losartan (cozaar) to 100mg  once daily.  *If you need a refill on your cardiac medications before your next appointment, please call your pharmacy*   Lab Work: Your physician recommends that you return for lab work in: can be done no more than 7 days prior to cardioversion.  If you have labs (blood work) drawn today and your tests are completely normal, you will receive your results only by: [place] (if you have MyChart) OR A paper copy in the mail If you have any lab test that is abnormal or we need to change your treatment, we will call you to review the results.   Testing/Procedures: See below   Follow-Up: At [facility], you and your health needs are our priority.  As part of our continuing mission to provide you with exceptional heart care, we have created designated Provider Care Teams.  These Care Teams include your primary Cardiologist (physician) and Advanced Practice Providers (APPs -  Physician Assistants and Nurse Practitioners) who all work together to provide you with the care you need, when you need it.  We recommend signing up for the patient portal called "MyChart".  Sign up information is provided on this After Visit Summary.  MyChart is used to connect with patients for Virtual Visits (Telemedicine).  Patients are able to view lab/test results, encounter notes, upcoming appointments, etc.  Non-urgent messages can be sent to your provider as well.   To learn more about what you can do with MyChart, go to [email or web address].    Your next appointment:   2-3 week(s)  The format for your next appointment:   In Person  Provider:   [name], MD   Other Instructions  You are scheduled for a Cardioversion on Thursday, Oct. 19th with Dr. Marlou Porch.  Please arrive at the [facility] (Main Entrance A) at [facility]: [address] [place], [place] [address] at 6:30am. (1 hour prior to procedure unless  lab work is needed; if lab work is needed arrive 1.5 hours ahead)  DIET: Nothing to eat or drink after midnight except a sip of water with medications (see medication instructions below)  FYI: For your safety, and to allow [place] to monitor your vital signs accurately during the surgery/procedure we request that   if you have artificial nails, gel coating, SNS etc. Please have those removed prior to your surgery/procedure. Not having the nail coverings /polish removed may result in cancellation or delay of your surgery/procedure.    Continue your anticoagulant: Eliquis You will need to continue your anticoagulant after your procedure until you  are told by your  Provider that it is safe to stop   Labs: to be done prior to procedure   You must have a responsible person to drive you home and stay in the waiting area during your procedure. Failure to do so could result in cancellation.  Bring your insurance cards.  *Special Note: Every effort is made to have your procedure done on time. Occasionally there are emergencies that occur at the hospital that may cause delays. Please be patient if a delay does occur.

## 2022-06-30 DIAGNOSIS — I10 Essential (primary) hypertension: Secondary | ICD-10-CM | POA: Diagnosis not present

## 2022-06-30 DIAGNOSIS — I4819 Other persistent atrial fibrillation: Secondary | ICD-10-CM | POA: Diagnosis not present

## 2022-06-30 DIAGNOSIS — I7789 Other specified disorders of arteries and arterioles: Secondary | ICD-10-CM | POA: Diagnosis not present

## 2022-07-01 ENCOUNTER — Encounter: Payer: Self-pay | Admitting: *Deleted

## 2022-07-01 LAB — BASIC METABOLIC PANEL
BUN/Creatinine Ratio: 9 — ABNORMAL LOW (ref 10–24)
BUN: 9 mg/dL (ref 8–27)
CO2: 28 mmol/L (ref 20–29)
Calcium: 9.9 mg/dL (ref 8.6–10.2)
Chloride: 104 mmol/L (ref 96–106)
Creatinine, Ser: 0.97 mg/dL (ref 0.76–1.27)
Glucose: 101 mg/dL — ABNORMAL HIGH (ref 70–99)
Potassium: 4.8 mmol/L (ref 3.5–5.2)
Sodium: 142 mmol/L (ref 134–144)
eGFR: 81 mL/min/{1.73_m2} (ref >59)

## 2022-07-01 LAB — CBC
Hematocrit: 44.9 % (ref 37.5–51.0)
Hemoglobin: 15 g/dL (ref 13.0–17.7)
MCH: 32 pg (ref 26.6–33.0)
MCHC: 33.4 g/dL (ref 31.5–35.7)
MCV: 96 fL (ref 79–97)
Platelets: 125 10*3/uL — ABNORMAL LOW (ref 150–450)
RBC: 4.69 x10E6/uL (ref 4.14–5.80)
RDW: 12.6 % (ref 11.6–15.4)
WBC: 6.2 10*3/uL (ref 3.4–10.8)

## 2022-07-05 ENCOUNTER — Encounter: Payer: Self-pay | Admitting: *Deleted

## 2022-07-06 NOTE — Anesthesia Preprocedure Evaluation (Signed)
Anesthesia Evaluation  Patient identified by MRN, date of birth, ID band Patient awake    Reviewed: Allergy & Precautions, H&P , NPO status , Patient's Chart, lab work & pertinent test results, reviewed documented beta blocker date and time   Airway Mallampati: I       Dental no notable dental hx.    Pulmonary neg pulmonary ROS,    Pulmonary exam normal        Cardiovascular hypertension, Pt. on medications and Pt. on home beta blockers + Peripheral Vascular Disease  Normal cardiovascular exam+ dysrhythmias Atrial Fibrillation  Rhythm:Irregular Rate:Normal  TAAA   Neuro/Psych negative neurological ROS  negative psych ROS   GI/Hepatic negative GI ROS, Neg liver ROS,   Endo/Other  negative endocrine ROS  Renal/GU negative Renal ROS  negative genitourinary   Musculoskeletal negative musculoskeletal ROS (+)   Abdominal Normal abdominal exam  (+)   Peds negative pediatric ROS (+)  Hematology negative hematology ROS (+)   Anesthesia Other Findings  IMPRESSIONS    1. Global hypokineiss abnormal septal motion EF has decreased since TTE  done 10/24/19. Left ventricular ejection fraction, by estimation, is 35 to  40%. The left ventricle has moderately decreased function. The left  ventricle demonstrates global  hypokinesis. The left ventricular internal cavity size was mildly dilated.  Left ventricular diastolic parameters are indeterminate.  2. Right ventricular systolic function is normal. The right ventricular  size is normal.  3. Left atrial size was mildly dilated.  4. The mitral valve is abnormal. Mild mitral valve regurgitation. No  evidence of mitral stenosis.  5. The aortic valve is tricuspid. There is mild calcification of the  aortic valve. There is mild thickening of the aortic valve. Aortic valve  regurgitation is mild. Aortic valve sclerosis is present, with no evidence  of aortic valve stenosis.   6. Consider gated chest CTA to assess full aorta if not already done.  Aortic dilatation noted. There is severe dilatation of the aortic root,  measuring 48 mm. There is severe dilatation of the ascending aorta,  measuring 46 mm.  7. The inferior vena cava is normal in size with greater than 50%  respiratory   Reproductive/Obstetrics negative OB ROS                            Anesthesia Physical  Anesthesia Plan  ASA: III  Anesthesia Plan: General   Post-op Pain Management:    Induction:   PONV Risk Score and Plan: 2 and Treatment may vary due to age or medical condition  Airway Management Planned: Nasal Cannula, Natural Airway and Simple Face Mask  Additional Equipment: None  Intra-op Plan:   Post-operative Plan:   Informed Consent: I have reviewed the patients History and Physical, chart, labs and discussed the procedure including the risks, benefits and alternatives for the proposed anesthesia with the patient or authorized representative who has indicated his/her understanding and acceptance.       Plan Discussed with: CRNA  Anesthesia Plan Comments:        Anesthesia Quick Evaluation

## 2022-07-07 ENCOUNTER — Ambulatory Visit (HOSPITAL_COMMUNITY): Payer: Medicare Other | Admitting: Anesthesiology

## 2022-07-07 ENCOUNTER — Ambulatory Visit (HOSPITAL_COMMUNITY)
Admission: RE | Admit: 2022-07-07 | Discharge: 2022-07-07 | Disposition: A | Discharge status: Home / Self Care | Payer: Medicare Other | Source: Ambulatory Visit | Attending: Cardiology | Admitting: Cardiology

## 2022-07-07 ENCOUNTER — Other Ambulatory Visit: Payer: Self-pay

## 2022-07-07 ENCOUNTER — Encounter (HOSPITAL_COMMUNITY)
Admission: RE | Disposition: A | Discharge status: Home / Self Care | Payer: Self-pay | Source: Ambulatory Visit | Attending: Cardiology

## 2022-07-07 ENCOUNTER — Encounter (HOSPITAL_COMMUNITY): Payer: Self-pay | Admitting: Cardiology

## 2022-07-07 ENCOUNTER — Ambulatory Visit (HOSPITAL_BASED_OUTPATIENT_CLINIC_OR_DEPARTMENT_OTHER): Payer: Medicare Other | Admitting: Anesthesiology

## 2022-07-07 DIAGNOSIS — I4891 Unspecified atrial fibrillation: Secondary | ICD-10-CM | POA: Diagnosis not present

## 2022-07-07 DIAGNOSIS — I10 Essential (primary) hypertension: Secondary | ICD-10-CM | POA: Diagnosis not present

## 2022-07-07 DIAGNOSIS — I4819 Other persistent atrial fibrillation: Secondary | ICD-10-CM

## 2022-07-07 DIAGNOSIS — I739 Peripheral vascular disease, unspecified: Secondary | ICD-10-CM

## 2022-07-07 DIAGNOSIS — Z79899 Other long term (current) drug therapy: Secondary | ICD-10-CM | POA: Diagnosis not present

## 2022-07-07 DIAGNOSIS — I351 Nonrheumatic aortic (valve) insufficiency: Secondary | ICD-10-CM | POA: Insufficient documentation

## 2022-07-07 DIAGNOSIS — I429 Cardiomyopathy, unspecified: Secondary | ICD-10-CM | POA: Insufficient documentation

## 2022-07-07 HISTORY — PX: CARDIOVERSION: SHX1299

## 2022-07-07 SURGERY — CARDIOVERSION
Anesthesia: General

## 2022-07-07 MED ORDER — PROPOFOL 10 MG/ML IV BOLUS
INTRAVENOUS | Status: DC | PRN
  Administered 2022-07-07: 70 mg via INTRAVENOUS

## 2022-07-07 MED ORDER — SODIUM CHLORIDE 0.9 % IV SOLN: INTRAVENOUS | Status: DC

## 2022-07-07 MED ORDER — LIDOCAINE 2% (20 MG/ML) 5 ML SYRINGE
INTRAMUSCULAR | Status: DC | PRN
  Administered 2022-07-07: 60 mg via INTRAVENOUS

## 2022-07-07 NOTE — Anesthesia Procedure Notes (Signed)
Procedure Name: General with mask airway Date/Time: 07/07/2022 7:51 AM  Performed by: Dorann Lodge, CRNAPre-anesthesia Checklist: Patient identified, Emergency Drugs available, Suction available and Patient being monitored Patient Re-evaluated:Patient Re-evaluated prior to induction Oxygen Delivery Method: Ambu bag Preoxygenation: Pre-oxygenation with 100% oxygen Induction Type: IV induction Dental Injury: Teeth and Oropharynx as per pre-operative assessment

## 2022-07-07 NOTE — Transfer of Care (Signed)
Immediate Anesthesia Transfer of Care Note  Patient: Richard Irwin  Procedure(s) Performed: CARDIOVERSION  Patient Location: Endoscopy Unit  Anesthesia Type:General  Level of Consciousness: awake and drowsy  Airway & Oxygen Therapy: Patient Spontanous Breathing  Post-op Assessment: Report given to RN and Post -op Vital signs reviewed and stable  Post vital signs: Reviewed and stable  Last Vitals:  Vitals Value Taken Time  BP 102/77 (87)   Temp    Pulse 65   Resp 16   SpO2 95     Last Pain:  Vitals:   07/07/22 0703  TempSrc: Temporal  PainSc: 0-No pain         Complications: No notable events documented.

## 2022-07-07 NOTE — Discharge Instructions (Signed)

## 2022-07-07 NOTE — CV Procedure (Signed)
    Electrical Cardioversion Procedure Note Richard Irwin [id number] 02/13/1946  Procedure: Electrical Cardioversion Indications:  Atrial Fibrillation  Time Out: Verified patient identification, verified procedure,medications/allergies/relevent history reviewed, required imaging and test results available.  Performed  Procedure Details  The patient was NPO after midnight. Anesthesia was administered at the beside  by Dr.Hatchet with propofol.  Cardioversion was performed with synchronized biphasic defibrillation via AP pads with 200 joules.  1 attempt(s) were performed.  The patient converted to normal sinus rhythm. The patient tolerated the procedure well   IMPRESSION:  Successful cardioversion of atrial fibrillation    Richard Irwin 07/07/2022, 7:58 AM

## 2022-07-07 NOTE — Interval H&P Note (Signed)
History and Physical Interval Note:  07/07/2022 7:46 AM  Richard Irwin  has presented today for surgery, with the diagnosis of AFIB.  The various methods of treatment have been discussed with the patient and family. After consideration of risks, benefits and other options for treatment, the patient has consented to  Procedure(s): CARDIOVERSION (N/A) as a surgical intervention.  The patient's history has been reviewed, patient examined, no change in status, stable for surgery.  I have reviewed the patient's chart and labs.  Questions were answered to the patient's satisfaction.     [employer]

## 2022-07-07 NOTE — Anesthesia Postprocedure Evaluation (Signed)
Anesthesia Post Note  Patient: Richard Irwin  Procedure(s) Performed: CARDIOVERSION     Patient location during evaluation: Endoscopy Anesthesia Type: General Level of consciousness: awake and sedated Pain management: pain level controlled Vital Signs Assessment: post-procedure vital signs reviewed and stable Respiratory status: spontaneous breathing Cardiovascular status: stable Postop Assessment: no apparent nausea or vomiting Anesthetic complications: no   No notable events documented.  Last Vitals:  Vitals:   07/07/22 0817 07/07/22 0819  BP: 112/84 114/89  Pulse: (!) 48 64  Resp: 16 18  Temp:    SpO2: 95% 95%    Last Pain:  Vitals:   07/07/22 0819  TempSrc:   PainSc: 0-No pain                 Huston Foley

## 2022-07-11 ENCOUNTER — Encounter (HOSPITAL_COMMUNITY): Payer: Self-pay | Admitting: Cardiology

## 2022-07-11 NOTE — Progress Notes (Unsigned)
Cardiology Office Note   Date:  07/14/2022   ID:  Finnegan, Gatta 11/10/1945, MRN [id number]  PCP:  Lawerance Cruel, MD  Cardiologist:   None Referring:  Lawerance Cruel, MD   Chief Complaint  Patient presents with   Atrial Fibrillation      History of Present Illness: Richard Irwin is a 76 y.o. male who presents for evaluation of atrial fib.  He is referred by Lawerance Cruel, MD     Echo in 2021 demonstrated an EF of 50 - 55%.   There was moderate to  severe aortic enlargement measuring 47 mm.  This was at the time of syncope when he was hospitalized.  This turned out to be related to alcohol intoxication.  He does not drink any longer.  He has had atrial fib.  He had good rate control on Zio patch.  He does have a slightly reduced ejection fraction on echocardiography.  His EF had been 50 to 55% in 2021.  Now it is 35 to 40%.  Left atrial size is mildly enlarged.  There is some mild aortic insufficiency.    He had cardioversion since I last saw him but today is back in atrial fibrillation.  He was in sinus rhythm when he had the procedure.  He has not noticed a fibrillation.  He still has some fatigue.  He still has some windedness when he walks around the block but this is not any different than previous.  He is not having any presyncope or syncope.  He is not having any chest pressure, neck or arm discomfort.  He has had no weight gain or edema.   Past Medical History:  Diagnosis Date   Acute blood loss anemia    Anemia    Diverticulosis    Emphysema/COPD ([place])    Esophageal varices (HCC)    H/O: GI bleed    Hypertension    Insomnia    Status post colonoscopy with polypectomy    with bleed   Thoracic ascending aortic aneurysm ([place]) 05/07/2013   Tubular adenoma     Past Surgical History:  Procedure Laterality Date   APPENDECTOMY     CARDIOVERSION N/A 07/07/2022   Procedure: CARDIOVERSION;  Surgeon: Jerline Pain, MD;  Location: [facility];   Service: Cardiovascular;  Laterality: N/A;   COLONOSCOPY WITH PROPOFOL N/A 07/09/2014   Procedure: COLONOSCOPY WITH PROPOFOL;  Surgeon: Irene Shipper, MD;  Location: [facility];  Service: Endoscopy;  Laterality: N/A;   EXCISIONAL HEMORRHOIDECTOMY     HERNIA REPAIR     ROTATOR CUFF REPAIR       Current Outpatient Medications  Medication Sig Dispense Refill   alendronate (FOSAMAX) 70 MG tablet Take 70 mg by mouth every Saturday.     apixaban (ELIQUIS) 5 MG TABS tablet Take 5 mg by mouth 2 (two) times daily.     Cholecalciferol (VITAMIN D) 50 MCG (2000 UT) tablet Take 2,000 Units by mouth daily.     magnesium oxide (MAG-OX) 400 (240 Mg) MG tablet Take 400 mg by mouth daily.     metoprolol succinate (TOPROL-XL) 25 MG 24 hr tablet Take 25 mg by mouth daily.     sacubitril-valsartan (ENTRESTO) 49-51 MG Take 1 tablet by mouth 2 (two) times daily. 180 tablet 1   No current facility-administered medications for this visit.    Allergies:   Lisinopril    ROS:  Please see the history of present illness.  Otherwise, review of systems are positive for none.   All other systems are reviewed and negative.    PHYSICAL EXAM: VS:  BP (!) 140/90   Pulse 92   Ht 6' (1.829 m)   Wt 169 lb 6.4 oz (76.8 kg)   SpO2 98%   BMI 22.97 kg/m  , BMI Body mass index is 22.97 kg/m. GENERAL:  Well appearing NECK:  No jugular venous distention, waveform within normal limits, carotid upstroke brisk and symmetric, no bruits, no thyromegaly LUNGS:  Clear to auscultation bilaterally CHEST:  Unremarkable HEART:  PMI not displaced or sustained,S1 and S2 within normal limits, no S3, no clicks, no rubs, no murmurs, irregular ABD:  Flat, positive bowel sounds normal in frequency in pitch, no bruits, no rebound, no guarding, no midline pulsatile mass, no hepatomegaly, no splenomegaly EXT:  2 plus pulses throughout, no edema, no cyanosis no clubbing   EKG:  EKG is  ordered today. The ekg ordered today demonstrates  atrial fibrillation, rate 92, PVCs, leftward axis, borderline interventricular conduction delay   Recent Labs: 06/06/2022: TSH 1.830 06/30/2022: BUN 9; Creatinine, Ser 0.97; Hemoglobin 15.0; Platelets 125; Potassium 4.8; Sodium 142    Lipid Panel No results found for: "CHOL", "TRIG", "HDL", "CHOLHDL", "VLDL", "LDLCALC", "LDLDIRECT"    Wt Readings from Last 3 Encounters:  07/14/22 169 lb 6.4 oz (76.8 kg)  07/07/22 170 lb (77.1 kg)  06/27/22 170 lb (77.1 kg)      Other studies Reviewed: Additional studies/ records that were reviewed today include: Hospital records and labs Review of the above records demonstrates:  Please see elsewhere in the note.     ASSESSMENT AND PLAN:  Atrial Fib: The patient has asymptomatic atrial fibrillation.  Mr. LEVOY GEISEN has a CHA2DS2 - VASc score of 3.  He is status post cardioversion but now back in A-fib.  I think he be a good date for EP ablation.  He stopped drinking alcohol.  I will set him up to see EP.  Aortic root enlargement:   This was 45 mm on CT .  I will follow this up in 1 year.  HTN: His blood pressure is going to be treated in the context of managing his cardiomyopathy.  I am going to stop the Cozaar and start Entresto 49/51 twice daily.   Cardiomyopathy: I think this is probably related to the A-fib and the alcohol.  We will do med titration and follow-up on his EF in the future.  I do not have a strong suspicion of an ischemic etiology.  The next step might be to titrate his beta-blocker slowly although he had some fatigue.  He has been to OMT and repeat an EF.   Current medicines are reviewed at length with the patient today.  The patient does not have concerns regarding medicines.  The following changes have been made: As above  Labs/ tests ordered today include: None  Orders Placed This Encounter  Procedures   Ambulatory referral to Cardiac Electrophysiology   EKG 12-Lead     Disposition:   FU with APP in 1  month   Signed, [name], MD  07/14/2022 9:34 AM    [place]

## 2022-07-12 DIAGNOSIS — I7789 Other specified disorders of arteries and arterioles: Secondary | ICD-10-CM | POA: Insufficient documentation

## 2022-07-12 DIAGNOSIS — I420 Dilated cardiomyopathy: Secondary | ICD-10-CM | POA: Insufficient documentation

## 2022-07-14 ENCOUNTER — Encounter: Payer: Self-pay | Admitting: Cardiology

## 2022-07-14 ENCOUNTER — Ambulatory Visit: Payer: No Typology Code available for payment source | Attending: Cardiology | Admitting: Cardiology

## 2022-07-14 VITALS — BP 140/90 | HR 92 | Ht 72.0 in | Wt 169.4 lb

## 2022-07-14 DIAGNOSIS — I420 Dilated cardiomyopathy: Secondary | ICD-10-CM | POA: Diagnosis not present

## 2022-07-14 DIAGNOSIS — I7789 Other specified disorders of arteries and arterioles: Secondary | ICD-10-CM | POA: Diagnosis not present

## 2022-07-14 DIAGNOSIS — I10 Essential (primary) hypertension: Secondary | ICD-10-CM | POA: Diagnosis not present

## 2022-07-14 DIAGNOSIS — I482 Chronic atrial fibrillation, unspecified: Secondary | ICD-10-CM | POA: Diagnosis not present

## 2022-07-14 MED ORDER — SACUBITRIL-VALSARTAN 49-51 MG PO TABS: 1.0000 | ORAL_TABLET | Freq: Two times a day (BID) | ORAL | 1 refills | Status: DC

## 2022-07-14 NOTE — Patient Instructions (Signed)
Medication Instructions:  Your physician has recommended you make the following change in your medication:   -Stop losartan (cozaar).  -Start sacubitril- valsartan (Entresto) 49/51mg  twice daily.    *If you need a refill on your cardiac medications before your next appointment, please call your pharmacy*    Follow-Up: At [facility], you and your health needs are our priority.  As part of our continuing mission to provide you with exceptional heart care, we have created designated Provider Care Teams.  These Care Teams include your primary Cardiologist (physician) and Advanced Practice Providers (APPs -  Physician Assistants and Nurse Practitioners) who all work together to provide you with the care you need, when you need it.  We recommend signing up for the patient portal called "MyChart".  Sign up information is provided on this After Visit Summary.  MyChart is used to connect with patients for Virtual Visits (Telemedicine).  Patients are able to view lab/test results, encounter notes, upcoming appointments, etc.  Non-urgent messages can be sent to your provider as well.   To learn more about what you can do with MyChart, go to [email or web address].    Your next appointment:   1 month(s)  The format for your next appointment:   In Person  Provider:   Fabian Sharp, PA-C, Sande Rives, PA-C, Jory Sims, DNP, ANP, Almyra Deforest, PA-C, or Diona Browner, NP

## 2022-07-21 DIAGNOSIS — E7800 Pure hypercholesterolemia, unspecified: Secondary | ICD-10-CM | POA: Diagnosis not present

## 2022-07-21 DIAGNOSIS — I10 Essential (primary) hypertension: Secondary | ICD-10-CM | POA: Diagnosis not present

## 2022-07-21 DIAGNOSIS — D696 Thrombocytopenia, unspecified: Secondary | ICD-10-CM | POA: Diagnosis not present

## 2022-07-26 DIAGNOSIS — L309 Dermatitis, unspecified: Secondary | ICD-10-CM | POA: Diagnosis not present

## 2022-07-26 DIAGNOSIS — Z6824 Body mass index (BMI) 24.0-24.9, adult: Secondary | ICD-10-CM | POA: Diagnosis not present

## 2022-07-26 DIAGNOSIS — Z0000 Encounter for general adult medical examination without abnormal findings: Secondary | ICD-10-CM | POA: Diagnosis not present

## 2022-07-26 DIAGNOSIS — I10 Essential (primary) hypertension: Secondary | ICD-10-CM | POA: Diagnosis not present

## 2022-07-26 DIAGNOSIS — E7800 Pure hypercholesterolemia, unspecified: Secondary | ICD-10-CM | POA: Diagnosis not present

## 2022-07-26 DIAGNOSIS — D696 Thrombocytopenia, unspecified: Secondary | ICD-10-CM | POA: Diagnosis not present

## 2022-07-29 ENCOUNTER — Telehealth: Payer: Self-pay | Admitting: Cardiology

## 2022-07-29 NOTE — Telephone Encounter (Signed)
Wife called stating the patient will need the notes where Dr. Percival Spanish prescribed the patient's [facility] faxed to the [place] at fax# [phone], ATTN: Dr. Rondell Reams.  Wife requested that the patient Social Security number be referenced in the notes.

## 2022-07-29 NOTE — Telephone Encounter (Signed)
Spoke with pt, dr hochrein's last office note with dob and ss# included.

## 2022-08-02 NOTE — Progress Notes (Signed)
Cardiology Office Note:    Date:  08/16/2022   ID:  Richard Irwin, DOB Jan 22, 1946, MRN [id number]  PCP:  Lawerance Cruel, [place] Providers Cardiologist:  [name], MD Electrophysiologist:  Melida Quitter, MD     Referring MD: Lawerance Cruel, MD   Chief Complaint:  Follow-up     History of Present Illness:   Richard Irwin is a 76 y.o. male with history of PAF DCCV 07/07/22, slight reduction of LVEF 35-40%, aortic root enlargement 45 mm on CT, HTN, ETOH-quit.  He saw Dr. Percival Spanish 10/26/233 and felt his CM was related to Afib and prior ETOH. Was back in Afib that day.Plan to titrate meds and repeat echo. Referred to EPS for possible ablation.    Patient comes in for f/u. He's having trouble with Entresto. He can't sleep since starting it.  He's drinking a lot of caffeine in ice tea-6-10 glasses daily.  Has a dry cough since on entresto. Didn't have a cough on losartan but did on lisinopril. Also dizzy when he stands.    Past Medical History:  Diagnosis Date   Acute blood loss anemia    Anemia    Diverticulosis    Emphysema/COPD ([place])    Esophageal varices (HCC)    H/O: GI bleed    Hypertension    Insomnia    Status post colonoscopy with polypectomy    with bleed   Thoracic ascending aortic aneurysm ([place]) 05/07/2013   Tubular adenoma    Current Medications: Current Meds  Medication Sig   alendronate (FOSAMAX) 70 MG tablet Take 70 mg by mouth every Saturday.   apixaban (ELIQUIS) 5 MG TABS tablet Take 5 mg by mouth 2 (two) times daily.   Cholecalciferol (VITAMIN D) 50 MCG (2000 UT) tablet Take 2,000 Units by mouth daily.   magnesium oxide (MAG-OX) 400 (240 Mg) MG tablet Take 400 mg by mouth daily.   metoprolol succinate (TOPROL-XL) 25 MG 24 hr tablet Take 25 mg by mouth daily.   sacubitril-valsartan (ENTRESTO) 49-51 MG Take 1 tablet by mouth 2 (two) times daily.   triamcinolone cream (KENALOG) 0.1 % 3 (three) times daily.     Allergies:   Lisinopril   Social History   Tobacco Use   Smoking status: Never   Smokeless tobacco: Never  Vaping Use   Vaping Use: Never used  Substance Use Topics   Alcohol use: Not Currently    Comment: beer 2-4 daily   Drug use: No    Family Hx: The patient's family history includes Cirrhosis in his father; Lung cancer in his mother. There is no history of Colon cancer, Pancreatic cancer, Rectal cancer, or Stomach cancer.  ROS     Physical Exam:    VS:  BP 112/80   Pulse (!) 56   Ht 6' (1.829 m)   Wt 175 lb (79.4 kg)   SpO2 98%   BMI 23.73 kg/m     Wt Readings from Last 3 Encounters:  08/16/22 175 lb (79.4 kg)  07/14/22 169 lb 6.4 oz (76.8 kg)  07/07/22 170 lb (77.1 kg)    Physical Exam  GEN: Thin, in no acute distress  Neck: no JVD, carotid bruits, or masses Cardiac: irreg irreg; no murmurs, rubs, or gallops  Respiratory:  clear to auscultation bilaterally, normal work of breathing GI: soft, nontender, nondistended, + BS Ext: without cyanosis, clubbing, or edema, Good distal pulses bilaterally Neuro:  Alert and Oriented x 3,  Psych:  euthymic mood, full affect        EKGs/Labs/Other Test Reviewed:    EKG:  EKG is  not ordered today.     Recent Labs: 06/06/2022: TSH 1.830 06/30/2022: BUN 9; Creatinine, Ser 0.97; Hemoglobin 15.0; Platelets 125; Potassium 4.8; Sodium 142   Recent Lipid Panel No results for input(s): "CHOL", "TRIG", "HDL", "VLDL", "LDLCALC", "LDLDIRECT" in the last 8760 hours.   Prior CV Studies:   Echo 06/20/22 IMPRESSIONS     1. Global hypokineiss abnormal septal motion EF has decreased since TTE  done 10/24/19. Left ventricular ejection fraction, by estimation, is 35 to  40%. The left ventricle has moderately decreased function. The left  ventricle demonstrates global  hypokinesis. The left ventricular internal cavity size was mildly dilated.  Left ventricular diastolic parameters are indeterminate.   2. Right ventricular  systolic function is normal. The right ventricular  size is normal.   3. Left atrial size was mildly dilated.   4. The mitral valve is abnormal. Mild mitral valve regurgitation. No  evidence of mitral stenosis.   5. The aortic valve is tricuspid. There is mild calcification of the  aortic valve. There is mild thickening of the aortic valve. Aortic valve  regurgitation is mild. Aortic valve sclerosis is present, with no evidence  of aortic valve stenosis.   6. Consider gated chest CTA to assess full aorta if not already done.  Aortic dilatation noted. There is severe dilatation of the aortic root,  measuring 48 mm. There is severe dilatation of the ascending aorta,  measuring 46 mm.   7. The inferior vena cava is normal in size with greater than 50%  respiratory variability, suggesting right atrial pressure of 3 mmHg.     Risk Assessment/Calculations/Metrics:    CHA2DS2-VASc Score = 4   This indicates a 4.8% annual risk of stroke. The patient's score is based upon: CHF History: 1 HTN History: 1 Diabetes History: 0 Stroke History: 0 Vascular Disease History: 0 Age Score: 2 Gender Score: 0             ASSESSMENT & PLAN:   No problem-specific Assessment & Plan notes found for this encounter.   Persistent Afib DCCV 07/07/22 but back in Afib at OV-referred to EPS for possible ablation-has appt Thurs.  NICM LVEF  35-40%, aortic root enlargement 45 mm on CT. Dr. Percival Spanish suspects this is due to Afib-titrate meds and repeat echo once on GDMT.  Patient dizzy and cough on entresto 49/51 mg. He is not orthostatic in the office today but symptomatic.  Will try to decrease entresto 24/26 mg bid to see if this helps. Keep track of his BP and decrease caffeine intake.  Dilated aortic root 45 mm-needs f/u CT in 1 yr  HTN controlled today.              Dispo:  No follow-ups on file.   Medication Adjustments/Labs and Tests Ordered: Current medicines are reviewed at length with the  patient today.  Concerns regarding medicines are outlined above.  Tests Ordered: Orders Placed This Encounter  Procedures   Basic metabolic panel   Medication Changes: No orders of the defined types were placed in this encounter.  Sumner Boast, PA-C  08/16/2022 11:11 AM    [address], [place], [place]  [address] Phone: [phone]; Fax: [phone]

## 2022-08-16 ENCOUNTER — Ambulatory Visit: Payer: Medicare Other | Admitting: Nurse Practitioner

## 2022-08-16 ENCOUNTER — Ambulatory Visit: Payer: Medicare Other | Attending: Nurse Practitioner | Admitting: Physician Assistant

## 2022-08-16 ENCOUNTER — Encounter: Payer: Self-pay | Admitting: Physician Assistant

## 2022-08-16 VITALS — BP 112/80 | HR 56 | Ht 72.0 in | Wt 175.0 lb

## 2022-08-16 DIAGNOSIS — Z79899 Other long term (current) drug therapy: Secondary | ICD-10-CM | POA: Diagnosis not present

## 2022-08-16 DIAGNOSIS — I10 Essential (primary) hypertension: Secondary | ICD-10-CM

## 2022-08-16 DIAGNOSIS — I77810 Thoracic aortic ectasia: Secondary | ICD-10-CM

## 2022-08-16 DIAGNOSIS — I4819 Other persistent atrial fibrillation: Secondary | ICD-10-CM | POA: Diagnosis not present

## 2022-08-16 DIAGNOSIS — I428 Other cardiomyopathies: Secondary | ICD-10-CM | POA: Diagnosis not present

## 2022-08-16 NOTE — Patient Instructions (Signed)
Medication Instructions:  DECREASE Entresto to 24/26mg  twice daily (RX provided since pt uses [facility]) *If you need a refill on your cardiac medications before your next appointment, please call your pharmacy*   Lab Work: BMET today If you have labs (blood work) drawn today and your tests are completely normal, you will receive your results only by: [place] (if you have MyChart) OR A paper copy in the mail If you have any lab test that is abnormal or we need to change your treatment, we will call you to review the results.   Testing/Procedures: NONE   Follow-Up: At [facility], you and your health needs are our priority.  As part of our continuing mission to provide you with exceptional heart care, we have created designated Provider Care Teams.  These Care Teams include your primary Cardiologist (physician) and Advanced Practice Providers (APPs -  Physician Assistants and Nurse Practitioners) who all work together to provide you with the care you need, when you need it.  Your next appointment:   09/09/22 @11 :30  The format for your next appointment:   In Person  Provider:   [name], MD     Important Information About Sugar

## 2022-08-16 NOTE — Progress Notes (Signed)
Cardiology Office Note:    Date:  08/30/2022   ID:  Richard Irwin, DOB 1946-07-27, MRN [id number]  PCP:  Richard Irwin, [place] Providers Cardiologist:  Richard Breeding, MD Electrophysiologist:  Richard Quitter, MD     Referring MD: Richard Cruel, MD   Chief Complaint:  Follow-up     History of Present Illness:   Richard Irwin is a 76 y.o. male with history of PAF DCCV 07/07/22, slight reduction of LVEF 35-40%, aortic root enlargement 45 mm on CT, HTN, ETOH-quit.   He saw Dr. Percival Irwin 10/26/233 and felt his CM was related to Afib and prior ETOH. Was back in Afib that day.Plan to titrate meds and repeat echo. Referred to EPS for possible ablation.      I saw the patient 08/16/22 with dizziness and cough since starting entresto. He wasn't orthostatic but I decreased dose 24/26 mg bid. He was drinking a lot of ice tea and I asked him to cut back.  Appt with Dr. Lindajo Irwin 08/18/22 and is scheduled for Afib ablation 10/14/21.  Patient comes in for f/u. When he decreased entresto his cough left for a week but then came back. Says he gets chest tightness and cough together. Worse in the evening at rest and not exertional. Can go away for months and then comes back. They are wondering if it's allergies. No GERD or reflux symptoms. Scheduled for coronary CTA in Jan. No regular exercise. Dizziness has improved.      Past Medical History:  Diagnosis Date   Acute blood loss anemia    Anemia    Diverticulosis    Emphysema/COPD ([place])    Esophageal varices (HCC)    H/O: GI bleed    Hypertension    Insomnia    Status post colonoscopy with polypectomy    with bleed   Thoracic ascending aortic aneurysm ([place]) 05/07/2013   Tubular adenoma    Current Medications: Current Meds  Medication Sig   alendronate (FOSAMAX) 70 MG tablet Take 70 mg by mouth every Saturday.   apixaban (ELIQUIS) 5 MG TABS tablet Take 5 mg by mouth 2 (two) times daily.   Cholecalciferol  (VITAMIN D) 50 MCG (2000 UT) tablet Take 2,000 Units by mouth daily.   magnesium oxide (MAG-OX) 400 (240 Mg) MG tablet Take 400 mg by mouth daily.   metoprolol succinate (TOPROL-XL) 25 MG 24 hr tablet Take 25 mg by mouth daily.   sacubitril-valsartan (ENTRESTO) 49-51 MG Take 1 tablet by mouth 2 (two) times daily. (Patient taking differently: Take 0.5 tablets by mouth 2 (two) times daily.)   triamcinolone cream (KENALOG) 0.1 % 3 (three) times daily.    Allergies:   Lisinopril   Social History   Tobacco Use   Smoking status: Never   Smokeless tobacco: Never  Vaping Use   Vaping Use: Never used  Substance Use Topics   Alcohol use: Not Currently    Comment: beer 2-4 daily   Drug use: No    Family Hx: The patient's family history includes Cirrhosis in his father; Lung cancer in his mother. There is no history of Colon cancer, Pancreatic cancer, Rectal cancer, or Stomach cancer.  ROS     Physical Exam:    VS:  BP 118/60 (BP Location: Right Arm, Patient Position: Sitting, Cuff Size: Normal)   Resp (!) 64   Ht 6' (1.829 m)   Wt 170 lb 4 oz (77.2 kg)   SpO2 98%  BMI 23.09 kg/m     Wt Readings from Last 3 Encounters:  08/30/22 170 lb 4 oz (77.2 kg)  08/18/22 174 lb (78.9 kg)  08/16/22 175 lb (79.4 kg)    Physical Exam  GEN: Thin, in no acute distress  Neck: no JVD, carotid bruits, or masses Cardiac:RRR; no murmurs, rubs, or gallops  Respiratory:  clear to auscultation bilaterally, normal work of breathing GI: soft, nontender, nondistended, + BS Ext: without cyanosis, clubbing, or edema, Good distal pulses bilaterally Neuro:  Alert and Oriented x 3,  Psych: euthymic mood, full affect        EKGs/Labs/Other Test Reviewed:    EKG:  EKG is not ordered today.     Recent Labs: 06/06/2022: TSH 1.830 06/30/2022: Hemoglobin 15.0; Platelets 125 08/16/2022: BUN 12; Creatinine, Ser 0.98; Potassium 4.0; Sodium 139   Recent Lipid Panel No results for input(s): "CHOL", "TRIG",  "HDL", "VLDL", "LDLCALC", "LDLDIRECT" in the last 8760 hours.   Prior CV Studies:   Echo 06/20/22 IMPRESSIONS     1. Global hypokineiss abnormal septal motion EF has decreased since TTE  done 10/24/19. Left ventricular ejection fraction, by estimation, is 35 to  40%. The left ventricle has moderately decreased function. The left  ventricle demonstrates global  hypokinesis. The left ventricular internal cavity size was mildly dilated.  Left ventricular diastolic parameters are indeterminate.   2. Right ventricular systolic function is normal. The right ventricular  size is normal.   3. Left atrial size was mildly dilated.   4. The mitral valve is abnormal. Mild mitral valve regurgitation. No  evidence of mitral stenosis.   5. The aortic valve is tricuspid. There is mild calcification of the  aortic valve. There is mild thickening of the aortic valve. Aortic valve  regurgitation is mild. Aortic valve sclerosis is present, with no evidence  of aortic valve stenosis.   6. Consider gated chest CTA to assess full aorta if not already done.  Aortic dilatation noted. There is severe dilatation of the aortic root,  measuring 48 mm. There is severe dilatation of the ascending aorta,  measuring 46 mm.   7. The inferior vena cava is normal in size with greater than 50%  respiratory variability, suggesting right atrial pressure of 3 mmHg.        Risk Assessment/Calculations/Metrics:    CHA2DS2-VASc Score = 4   This indicates a 4.8% annual risk of stroke. The patient's score is based upon: CHF History: 1 HTN History: 1 Diabetes History: 0 Stroke History: 0 Vascular Disease History: 0 Age Score: 2 Gender Score: 0             ASSESSMENT & PLAN:   No problem-specific Assessment & Plan notes found for this encounter.   Persistent Afib DCCV 07/07/22 but back in Afib at OV-scheduled for ablation in Jan with Dr. Myles Irwin   NICM LVEF  35-40%, aortic root enlargement 45 mm on CT. Dr.  Percival Irwin suspects this is due to Afib-titrate meds and repeat echo once on GDMT.  Patient dizzy and cough on entresto 49/51 mg. He was not orthostatic in the office  I decrease entresto 24/26 mg bid and dizziness improved. Cough left for a week and then returned but not as bad-they think he may have allergies and will try zyrtec. Some chest tightness with cough at rest-scheduled for Coronary CTA in Jan. Compensated today.   Dilated aortic root 45 mm-needs f/u CT in 1 yr   HTN controlled today.  Dispo:  No follow-ups on file.   Medication Adjustments/Labs and Tests Ordered: Current medicines are reviewed at length with the patient today.  Concerns regarding medicines are outlined above.  Tests Ordered: No orders of the defined types were placed in this encounter.  Medication Changes: No orders of the defined types were placed in this encounter.  Signed, Ermalinda Barrios, PA-C  08/30/2022 8:58 AM    [facility], [place], [place]  [address] Phone: [phone]; Fax: [phone]

## 2022-08-17 LAB — BASIC METABOLIC PANEL
BUN/Creatinine Ratio: 12 (ref 10–24)
BUN: 12 mg/dL (ref 8–27)
CO2: 23 mmol/L (ref 20–29)
Calcium: 9.2 mg/dL (ref 8.6–10.2)
Chloride: 101 mmol/L (ref 96–106)
Creatinine, Ser: 0.98 mg/dL (ref 0.76–1.27)
Glucose: 95 mg/dL (ref 70–99)
Potassium: 4 mmol/L (ref 3.5–5.2)
Sodium: 139 mmol/L (ref 134–144)
eGFR: 80 mL/min/{1.73_m2} (ref >59)

## 2022-08-18 ENCOUNTER — Ambulatory Visit: Payer: Medicare Other | Attending: Cardiovascular Disease | Admitting: Cardiovascular Disease

## 2022-08-18 ENCOUNTER — Encounter: Payer: Self-pay | Admitting: Cardiovascular Disease

## 2022-08-18 VITALS — BP 146/90 | HR 109 | Ht 72.0 in | Wt 174.0 lb

## 2022-08-18 DIAGNOSIS — I4819 Other persistent atrial fibrillation: Secondary | ICD-10-CM

## 2022-08-18 NOTE — Patient Instructions (Signed)
Medication Instructions:  Your physician recommends that you continue on your current medications as directed. Please refer to the Current Medication list given to you today.  *If you need a refill on your cardiac medications before your next appointment, please call your pharmacy*  Lab Work: BMET and CBC prior to CT scan  If you have labs (blood work) drawn today and your tests are completely normal, you will receive your results only by: [place] (if you have MyChart) OR A paper copy in the mail If you have any lab test that is abnormal or we need to change your treatment, we will call you to review the results.  Testing/Procedures: Your physician has requested that you have cardiac CT. Cardiac computed tomography (CT) is a painless test that uses an x-ray machine to take clear, detailed pictures of your heart. For further information please visit [email or web address].tn. Please follow instruction sheet as given.  Your physician has recommended that you have an ablation. Catheter ablation is a medical procedure used to treat some cardiac arrhythmias (irregular heartbeats). During catheter ablation, a long, thin, flexible tube is put into a blood vessel in your groin (upper thigh), or neck. This tube is called an ablation catheter. It is then guided to your heart through the blood vessel. Radio frequency waves destroy small areas of heart tissue where abnormal heartbeats may cause an arrhythmia to start. Please see the instruction sheet given to you today.  Follow-Up: At [facility], you and your health needs are our priority.  As part of our continuing mission to provide you with exceptional heart care, we have created designated Provider Care Teams.  These Care Teams include your primary Cardiologist (physician) and Advanced Practice Providers (APPs -  Physician Assistants and Nurse Practitioners) who all work together to provide you with the care you need, when you need  it.  Your next appointment:   See Instruction Letter  Important Information About Sugar

## 2022-08-18 NOTE — Progress Notes (Signed)
Electrophysiology Office Note:    Date:  08/18/2022   ID:  Richard Irwin, DOB [birth year], MRN [id number]  PCP:  Lawerance Cruel, [facility] Cardiologist:  [name], MD Electrophysiologist:  Melida Quitter, MD     Referring MD: [name], MD   History of Present Illness:    Richard Irwin is a 76 y.o. male with a hx of CHFrEF (EF 35-40%), HTN, PAF and other issues listed below referred to EP clinic for management of persistent AF.  He has had an episode for syncope in 2021 in the setting of alcohol withdrawal.  During the admission he had an episode of NSVT. EF was normal at that time. He quit drinking and remains abstinent.   He saw Dr. Percival Spanish in September of this year after AF was detected incidentally during a routine visit to the [place]. Repeat TTE showed an EF drop to 35-40%. Eliquis and betablocker were started. We was not particularly symptomatic. DCCV was performed, but AF recurred prior to his next follow-up.  He reports that he feels well today - no chest pain, syncope, near-syncope, change in baseline shortness of breath with exertion.    Past Medical History:  Diagnosis Date   Acute blood loss anemia    Anemia    Diverticulosis    Emphysema/COPD ([place])    Esophageal varices (HCC)    H/O: GI bleed    Hypertension    Insomnia    Status post colonoscopy with polypectomy    with bleed   Thoracic ascending aortic aneurysm ([place]) 05/07/2013   Tubular adenoma     Past Surgical History:  Procedure Laterality Date   APPENDECTOMY     CARDIOVERSION N/A 07/07/2022   Procedure: CARDIOVERSION;  Surgeon: Jerline Pain, MD;  Location: [facility];  Service: Cardiovascular;  Laterality: N/A;   COLONOSCOPY WITH PROPOFOL N/A 07/09/2014   Procedure: COLONOSCOPY WITH PROPOFOL;  Surgeon: Irene Shipper, MD;  Location: [facility];  Service: Endoscopy;  Laterality: N/A;   EXCISIONAL HEMORRHOIDECTOMY     HERNIA REPAIR     ROTATOR  CUFF REPAIR      Current Medications: Current Meds  Medication Sig   alendronate (FOSAMAX) 70 MG tablet Take 70 mg by mouth every Saturday.   apixaban (ELIQUIS) 5 MG TABS tablet Take 5 mg by mouth 2 (two) times daily.   Cholecalciferol (VITAMIN D) 50 MCG (2000 UT) tablet Take 2,000 Units by mouth daily.   magnesium oxide (MAG-OX) 400 (240 Mg) MG tablet Take 400 mg by mouth daily.   metoprolol succinate (TOPROL-XL) 25 MG 24 hr tablet Take 25 mg by mouth daily.   sacubitril-valsartan (ENTRESTO) 49-51 MG Take 1 tablet by mouth 2 (two) times daily.   triamcinolone cream (KENALOG) 0.1 % 3 (three) times daily.     Allergies:   Lisinopril   Social History   Socioeconomic History   Marital status: Married    Spouse name: Not on file   Number of children: 1   Years of education: Not on file   Highest education level: Not on file  Occupational History   Occupation: retired Press photographer  Tobacco Use   Smoking status: Never   Smokeless tobacco: Never  Vaping Use   Vaping Use: Never used  Substance and Sexual Activity   Alcohol use: Not Currently    Comment: beer 2-4 daily   Drug use: No   Sexual activity: Yes  Other Topics Concern   Not on  file  Social History Narrative   Not on file   Social Determinants of Health   Financial Resource Strain: Not on file  Food Insecurity: Not on file  Transportation Needs: Not on file  Physical Activity: Not on file  Stress: Not on file  Social Connections: Not on file     Family History: The patient's family history includes Cirrhosis in his father; Lung cancer in his mother. There is no history of Colon cancer, Pancreatic cancer, Rectal cancer, or Stomach cancer.  ROS:   Please see the history of present illness.    All other systems reviewed and are negative.  EKGs/Labs/Other Studies Reviewed Today:     3 day [place] AF: HR 47-171, AVG 91  Rare PVCs, sometimes occurring in bigeminy  TTE 06/20/2022 EF 35-40%, mildly dilated  LA  EKG:  Last EKG results: today - Atrial fibrillation with RVR, PVCs. Incomplete right bundle branch block   Recent Labs: 06/06/2022: TSH 1.830 06/30/2022: Hemoglobin 15.0; Platelets 125 08/16/2022: BUN 12; Creatinine, Ser 0.98; Potassium 4.0; Sodium 139     Physical Exam:    VS:  BP (!) 146/90   Pulse (!) 109   Ht 6' (1.829 m)   Wt 174 lb (78.9 kg)   SpO2 97%   BMI 23.60 kg/m     Wt Readings from Last 3 Encounters:  08/18/22 174 lb (78.9 kg)  08/16/22 175 lb (79.4 kg)  07/14/22 169 lb 6.4 oz (76.8 kg)     GEN: Well nourished, well developed in no acute distress CARDIAC: Irregular rhythm, no murmurs, rubs, gallops RESPIRATORY:  Normal work of breathing MUSCULOSKELETAL: no edema    ASSESSMENT & PLAN:    Atrial fibrillation: persistent, likely the cause of CHFrEF. NYHA is I-II. I think rhythm control is indicated to prevent progression of CHF. We discussed the indication, rationale, logistics, anticipated benefits, and potential risks of the ablation procedure including but not limited to -- bleed at the groin access site, chest pain, damage to nearby organs such as the diaphragm, lungs, or esophagus, need for a drainage tube, or prolonged hospitalization. I explained that the risk for stroke, heart attack, need for open chest surgery, or even death is very low but not zero. he  expressed understanding and wishes to proceed.       - continue eliquis CHFrEF: on metoprol succinate 25, entresto 49-51. Pt appears well-compensated today.      Medication Adjustments/Labs and Tests Ordered: Current medicines are reviewed at length with the patient today.  Concerns regarding medicines are outlined above.  No orders of the defined types were placed in this encounter.  No orders of the defined types were placed in this encounter.    Signed, Melida Quitter, MD  08/18/2022 9:59 AM    [place]

## 2022-08-30 ENCOUNTER — Encounter: Payer: Self-pay | Admitting: Physician Assistant

## 2022-08-30 ENCOUNTER — Ambulatory Visit: Payer: Medicare Other | Attending: Cardiology | Admitting: Physician Assistant

## 2022-08-30 VITALS — BP 118/60 | Resp 64 | Ht 72.0 in | Wt 170.2 lb

## 2022-08-30 DIAGNOSIS — I428 Other cardiomyopathies: Secondary | ICD-10-CM | POA: Diagnosis not present

## 2022-08-30 DIAGNOSIS — I77810 Thoracic aortic ectasia: Secondary | ICD-10-CM | POA: Diagnosis not present

## 2022-08-30 DIAGNOSIS — I4819 Other persistent atrial fibrillation: Secondary | ICD-10-CM | POA: Diagnosis not present

## 2022-08-30 DIAGNOSIS — I10 Essential (primary) hypertension: Secondary | ICD-10-CM

## 2022-08-30 NOTE — Patient Instructions (Signed)

## 2022-09-09 ENCOUNTER — Ambulatory Visit: Payer: Medicare Other | Admitting: Cardiology

## 2022-09-26 ENCOUNTER — Ambulatory Visit: Payer: Medicare Other | Attending: Cardiovascular Disease

## 2022-09-26 DIAGNOSIS — I4819 Other persistent atrial fibrillation: Secondary | ICD-10-CM

## 2022-09-27 LAB — BASIC METABOLIC PANEL WITH GFR
BUN/Creatinine Ratio: 12 (ref 10–24)
BUN: 11 mg/dL (ref 8–27)
CO2: 23 mmol/L (ref 20–29)
Calcium: 9.3 mg/dL (ref 8.6–10.2)
Chloride: 103 mmol/L (ref 96–106)
Creatinine, Ser: 0.91 mg/dL (ref 0.76–1.27)
Glucose: 105 mg/dL — ABNORMAL HIGH (ref 70–99)
Potassium: 4.4 mmol/L (ref 3.5–5.2)
Sodium: 141 mmol/L (ref 134–144)
eGFR: 87 mL/min/1.73

## 2022-09-27 LAB — CBC WITH DIFFERENTIAL/PLATELET
Basophils Absolute: 0 x10E3/uL (ref 0.0–0.2)
Basos: 1 %
EOS (ABSOLUTE): 0.1 x10E3/uL (ref 0.0–0.4)
Eos: 2 %
Hematocrit: 45.9 % (ref 37.5–51.0)
Hemoglobin: 15.2 g/dL (ref 13.0–17.7)
Immature Grans (Abs): 0 x10E3/uL (ref 0.0–0.1)
Immature Granulocytes: 0 %
Lymphocytes Absolute: 2.2 x10E3/uL (ref 0.7–3.1)
Lymphs: 40 %
MCH: 31.7 pg (ref 26.6–33.0)
MCHC: 33.1 g/dL (ref 31.5–35.7)
MCV: 96 fL (ref 79–97)
Monocytes Absolute: 0.5 x10E3/uL (ref 0.1–0.9)
Monocytes: 9 %
Neutrophils Absolute: 2.6 x10E3/uL (ref 1.4–7.0)
Neutrophils: 48 %
Platelets: 123 x10E3/uL — ABNORMAL LOW (ref 150–450)
RBC: 4.8 x10E6/uL (ref 4.14–5.80)
RDW: 12.3 % (ref 11.6–15.4)
WBC: 5.5 x10E3/uL (ref 3.4–10.8)

## 2022-10-05 ENCOUNTER — Telehealth (HOSPITAL_COMMUNITY): Payer: Self-pay | Admitting: Emergency Medicine

## 2022-10-05 NOTE — Telephone Encounter (Signed)
Reaching out to patient to offer assistance regarding upcoming cardiac imaging study; pt verbalizes understanding of appt date/time, parking situation and where to check in, pre-test NPO status and medications ordered, and verified current allergies; name and call back number provided for further questions should they arise Rockwell Alexandria RN Navigator Cardiac Imaging [facility] [phone] office [phone] cell  Arrival 730 WC entrance Denies iv issues Aware contrast Daily meds per usual

## 2022-10-07 ENCOUNTER — Ambulatory Visit (HOSPITAL_COMMUNITY)
Admission: RE | Admit: 2022-10-07 | Discharge: 2022-10-07 | Disposition: A | Discharge status: Home / Self Care | Payer: Medicare Other | Source: Ambulatory Visit | Attending: Cardiovascular Disease | Admitting: Cardiovascular Disease

## 2022-10-07 ENCOUNTER — Other Ambulatory Visit: Payer: Self-pay | Admitting: Home Health

## 2022-10-07 ENCOUNTER — Ambulatory Visit (HOSPITAL_BASED_OUTPATIENT_CLINIC_OR_DEPARTMENT_OTHER)
Admission: RE | Admit: 2022-10-07 | Discharge: 2022-10-07 | Disposition: A | Discharge status: Home / Self Care | Payer: Medicare Other | Source: Ambulatory Visit | Attending: Cardiology | Admitting: Cardiology

## 2022-10-07 ENCOUNTER — Other Ambulatory Visit: Payer: Self-pay | Admitting: Internal Medicine

## 2022-10-07 ENCOUNTER — Encounter: Payer: Self-pay | Admitting: Internal Medicine

## 2022-10-07 ENCOUNTER — Telehealth: Payer: Self-pay

## 2022-10-07 ENCOUNTER — Telehealth: Payer: Self-pay | Admitting: Home Health

## 2022-10-07 ENCOUNTER — Telehealth: Payer: Self-pay | Admitting: Internal Medicine

## 2022-10-07 ENCOUNTER — Other Ambulatory Visit: Payer: Self-pay

## 2022-10-07 ENCOUNTER — Ambulatory Visit: Payer: Medicare Other | Admitting: Internal Medicine

## 2022-10-07 ENCOUNTER — Telehealth: Payer: Self-pay | Admitting: Cardiovascular Disease

## 2022-10-07 VITALS — BP 112/80 | HR 82 | Ht 72.0 in | Wt 171.8 lb

## 2022-10-07 DIAGNOSIS — I2782 Chronic pulmonary embolism: Secondary | ICD-10-CM

## 2022-10-07 DIAGNOSIS — I4891 Unspecified atrial fibrillation: Secondary | ICD-10-CM

## 2022-10-07 DIAGNOSIS — I4819 Other persistent atrial fibrillation: Secondary | ICD-10-CM | POA: Insufficient documentation

## 2022-10-07 DIAGNOSIS — I2699 Other pulmonary embolism without acute cor pulmonale: Secondary | ICD-10-CM | POA: Diagnosis not present

## 2022-10-07 MED ORDER — RIVAROXABAN (XARELTO) VTE STARTER PACK (15 & 20 MG): ORAL_TABLET | ORAL | 0 refills | Status: DC

## 2022-10-07 MED ORDER — IOHEXOL 350 MG/ML SOLN
100.0000 mL | Freq: Once | INTRAVENOUS | Status: AC | PRN
  Administered 2022-10-07: 100 mL via INTRAVENOUS

## 2022-10-07 MED ORDER — DABIGATRAN ETEXILATE MESYLATE 150 MG PO CAPS: 150.0000 mg | ORAL_CAPSULE | Freq: Two times a day (BID) | ORAL | 0 refills | Status: DC

## 2022-10-07 MED ORDER — ENOXAPARIN SODIUM 120 MG/0.8ML IJ SOSY: 120.0000 mg | PREFILLED_SYRINGE | INTRAMUSCULAR | 0 refills | Status: DC

## 2022-10-07 MED ORDER — DABIGATRAN ETEXILATE MESYLATE 150 MG PO CAPS: 150.0000 mg | ORAL_CAPSULE | Freq: Two times a day (BID) | ORAL | 3 refills | Status: DC

## 2022-10-07 NOTE — Telephone Encounter (Signed)
Pt to see Dr Tenny Craw this afternoon (DOD).

## 2022-10-07 NOTE — Telephone Encounter (Signed)
Caller is providing vascular report.

## 2022-10-07 NOTE — Telephone Encounter (Signed)
Wife called after hour line, states new med Xarelto was not sent to [facility], re-sent the script.

## 2022-10-07 NOTE — Telephone Encounter (Signed)
Expedited PA submitted, key [id number].

## 2022-10-07 NOTE — Progress Notes (Signed)
Xarelto re-sent, wife states it was not called to pharmacy.

## 2022-10-07 NOTE — Telephone Encounter (Signed)
Radiology called to report to Dr Tenny Craw the DOD that the pt had his Pre Ablation CT today and it showed pulmonary emboli.... I called the pt and he reports that he has been taking his Eliquis daily with no missed doses and he has no s/sx of DVT.   Per Dr Tenny Craw... pt to be seen and to have a stat LED.   I advised the pt I will work on the appts and call him back asap.

## 2022-10-07 NOTE — Telephone Encounter (Signed)
Pt to have Doppler today at noon and see Dr Tenny Craw the DOD after that appt.

## 2022-10-07 NOTE — Telephone Encounter (Signed)
Per the Pharm D:   generic pradaxa not covered either, I just sent in rx for Xarelto starter pack - it's 15mg  twice daily with food x 21 days, then 20mg  once daily with food   MS if you can let him know please! the subsequent rx going to the [place] would just be for 20mg  daily to start once he's done with this month rx  Pt advised and verbalized understanding.

## 2022-10-07 NOTE — Telephone Encounter (Signed)
Needing to speak with DOD in regards to pt who is currently in office. Call transferred

## 2022-10-07 NOTE — Telephone Encounter (Signed)
Spoke with Richard Irwin. Patient is negative for DVT and negative for Baker's cyst in both legs

## 2022-10-07 NOTE — Progress Notes (Signed)
Cardiology Office Note:    Date:  10/07/2022   ID:  Richard Irwin, DOB 17-Nov-1945, MRN [id number]  PCP:  Richard Floro, MD  [facility] Cardiologist:  Richard Rotunda, MD Electrophysiologist:  Richard Small, MD      Referring MD: Richard Floro, MD   Chief Complaint:  Pt presents for follow up of CT showing PE   History of Present Illness:   Richard Irwin is a 77 y.o. male with history of PAF DCCV 07/07/22, slight reduction of LVEF 35-40%, aortic root enlargement 45 mm on CT, HTN, ETOH-quit.  He is followed by Richard Irwin   Seen in Oct 2023   LV dysfunction felt pto be related to Afib and prior EtOH     Referred to EP   He saw Dr. Antoine Irwin 10/26/233 and felt his CM was related to Afib and prior ETOH. Was back in Afib that day.Plan to titrate meds and repeat echo. Referred to EPS for possible ablation.     Appt with Dr. Lia Irwin 08/18/22 and is scheduled for Afib ablation 10/14/21.  Patient comes in for f/u. When he decreased entresto his cough left for a week but then came back. Says he gets chest tightness and cough together. Worse in the evening at rest and not exertional. Can go away for months and then comes back. They are wondering if it's allergies. No GERD or reflux symptoms. Scheduled for coronary CTA in Jan. No regular exercise. Dizziness has improved.   Today the patient underwent cardiac CT pre ablation   Significant for a filling defect in the RUL consistent with PE.   Also noted to have Ca score of over 8400.   LE dopplers today were neg for DVT The pt says he hs not missed Eliquis   He gets it filled at through the [place] system     The pt says he went to [place] over Xmas time   Otherwise no long trips  Denies LE swelling     He denies CP   Says he gets SOB with rapid activlty   Not with normal walking    No PND     Past Medical History:  Diagnosis Date   Acute blood loss anemia    Anemia    Diverticulosis    Emphysema/COPD (HCC)     Esophageal varices (HCC)    H/O: GI bleed    Hypertension    Insomnia    Status post colonoscopy with polypectomy    with bleed   Thoracic ascending aortic aneurysm (HCC) 05/07/2013   Tubular adenoma    Current Medications: Current Meds  Medication Sig   alendronate (FOSAMAX) 70 MG tablet Take 70 mg by mouth every Saturday.   apixaban (ELIQUIS) 5 MG TABS tablet Take 5 mg by mouth 2 (two) times daily.   Cholecalciferol (VITAMIN D) 50 MCG (2000 UT) tablet Take 2,000 Units by mouth daily.   magnesium oxide (MAG-OX) 400 (240 Mg) MG tablet Take 400 mg by mouth daily.   metoprolol succinate (TOPROL-XL) 25 MG 24 hr tablet Take 25 mg by mouth daily.   sacubitril-valsartan (ENTRESTO) 49-51 MG Take 1 tablet by mouth 2 (two) times daily. 1/2 tablet 2 times daily   triamcinolone cream (KENALOG) 0.1 % 3 (three) times daily.    Allergies:   Lisinopril   Social History   Tobacco Use   Smoking status: Never   Smokeless tobacco: Never  Vaping Use   Vaping Use: Never used  Substance Use Topics   Alcohol use: Not Currently    Comment: beer 2-4 daily   Drug use: No    Family Hx: The patient's family history includes Cirrhosis in his father; Lung cancer in his mother. There is no history of Colon cancer, Pancreatic cancer, Rectal cancer, or Stomach cancer.  ROS     Physical Exam:    VS:  BP 112/80   Pulse 82   Ht 6' (1.829 m)   Wt 171 lb 12.8 oz (77.9 kg)   SpO2 94%   BMI 23.30 kg/m     Wt Readings from Last 3 Encounters:  10/07/22 171 lb 12.8 oz (77.9 kg)  08/30/22 170 lb 4 oz (77.2 kg)  08/18/22 174 lb (78.9 kg)    Physical Exam  GEN: Thin, in no acute distress  Neck: no JVD, carotid bruits, ] Cardiac:Irreg irreg   No S3  no signficant murmurs  Respiratory:  clear to auscultation bilaterally,  GI: soft, nontender, nondistended, + BS Ext: without cyanosis, clubbing, or edema, Good distal pulses bilaterally Neuro:  Alert and Oriented x 3,  Psych: euthymic mood, full  affect        EKGs/Labs/Other Test Reviewed:    EKG:  EKG is not ordered today.     Recent Labs: 06/06/2022: TSH 1.830 09/26/2022: BUN 11; Creatinine, Ser 0.91; Hemoglobin 15.2; Platelets 123; Potassium 4.4; Sodium 141   Recent Lipid Panel No results for input(s): "CHOL", "TRIG", "HDL", "VLDL", "LDLCALC", "LDLDIRECT" in the last 8760 hours.   Prior CV Studies:  Cardiac CT   10/07/22   There is normal pulmonary vein drainage into the left atrium (2 on the right and 2 on the left) with ostial measurements as follows:   LIPV: 16.5 mm x 13.1 mm, area 1.73 cm2   LSPV: 23.0 mm x 22.6 mm, area 3.85 cm2   RIPV: 22.1 mm x 20.7 mm, area 3.52 cm2   RSPV: 18.1 mm x 16.0 mm, area 2.16 cm2   The left atrial appendage is large chicken wing / broccoli type with a single lobe and ostial size 33 x 21 mm and length 65 mm. There is no thrombus in the left atrial appendage, verified on PV delay images.   The esophagus runs in the left atrial midline and is not in the proximity to any of the pulmonary veins.   Aorta: Moderately dilated to 45 mm at the level of the main PA bifurcation. Aortic atherosclerosis. No dissection.   Aortic Valve:  Trileaflet.  No calcifications.   Coronary Arteries: Normal coronary origin. Right dominance. The study was performed without use of NTG and insufficient for plaque evaluation.   Calcium score: Coronary calcium score of 8480. This was 99th percentile for age-, race-, and sex-matched controls.   Dilated main PA to 31 mm, suggestive of pulmonary hypertension.   IMPRESSION: 1. There is normal pulmonary vein drainage into the left atrium.   2. The left atrial appendage is large chicken wing / broccoli type with a single lobe and ostial size 33 x 21 mm and length 65 mm. There is no thrombus in the left atrial appendage, verified on PV delay images.   3. The esophagus runs in the left atrial midline and is not in the proximity to any of the pulmonary  veins.   4. Calcium score: Coronary calcium score of 8480. This was 99th percentile for age-, race-, and sex-matched controls.   5. Aorta: Moderately dilated to 45 mm at the level of  the main PA bifurcation. Aortic atherosclerosis. No dissection.   6. Dilated main PA to 31 mm, suggestive of pulmonary hypertension.   7. Given significant coronary calcification, would consider ischemia evaluation.   Zoila Shutter, MD  1. Filling defect within right lower lobe segmental pulmonary arteries, compatible with pulmonary emboli. No evidence of acute right heart strain. 2. Similar aneurysmal dilation of the ascending thoracic aorta measuring 4.5 cm. Ascending thoracic aortic aneurysm. Recommend semi-annual imaging followup by CTA or MRA and referral to cardiothoracic surgery if not already obtained. This recommendation follows 2010 ACCF/AHA/AATS/ACR/ASA/SCA/SCAI/SIR/STS/SVM Guidelines for the Diagnosis and Management of Patients With Thoracic Aortic Disease. Circulation. 2010; 121: O703-D781. Aortic aneurysm NOS (ICD10-I71.9)   Critical Value/emergent results were called by telephone at the time of interpretation on 10/07/2022 at 10:03 am to provider Dr Tenny Craw, who verbally acknowledged these results.   Electronically Signed: By: Maudry Mayhew M.D. On: 10/07/2022 10:03   Echo 06/20/22 IMPRESSIONS     1. Global hypokineiss abnormal septal motion EF has decreased since TTE  done 10/24/19. Left ventricular ejection fraction, by estimation, is 35 to  40%. The left ventricle has moderately decreased function. The left  ventricle demonstrates global  hypokinesis. The left ventricular internal cavity size was mildly dilated.  Left ventricular diastolic parameters are indeterminate.   2. Right ventricular systolic function is normal. The right ventricular  size is normal.   3. Left atrial size was mildly dilated.   4. The mitral valve is abnormal. Mild mitral valve regurgitation. No   evidence of mitral stenosis.   5. The aortic valve is tricuspid. There is mild calcification of the  aortic valve. There is mild thickening of the aortic valve. Aortic valve  regurgitation is mild. Aortic valve sclerosis is present, with no evidence  of aortic valve stenosis.   6. Consider gated chest CTA to assess full aorta if not already done.  Aortic dilatation noted. There is severe dilatation of the aortic root,  measuring 48 mm. There is severe dilatation of the ascending aorta,  measuring 46 mm.   7. The inferior vena cava is normal in size with greater than 50%  respiratory variability, suggesting right atrial pressure of 3 mmHg.        Risk Assessment/Calculations/Metrics:    CHA2DS2-VASc Score = 4   This indicates a 4.8% annual risk of stroke. The patient's score is based upon: CHF History: 1 HTN History: 1 Diabetes History: 0 Stroke History: 0 Vascular Disease History: 0 Age Score: 2 Gender Score: 0             ASSESSMENT & PLAN:     1  Pulmonary Emboli  I have spoken with radiology   Definite filling defect noted   Pt has not missed any Eliquis  Reviewed with pharmacy   He is not on any meds that would alter drug meabolism Reviewed with PharmD Initial plans were to switch to pradaxa  since different mechanism of action( Lovenox x 5 days then Pradaxa) BUT pradaxa not covered by insurance  Will switch to Xarelto    Pt will need follow up CT at some pt to confirm resolution      2  Persistent Afib DCCV 07/07/22 but back in Afib at [facility]-scheduled for ablation with Dr. Nelly Laurence  This weill need to be rescheduled   I have contact G Mealor to relay current situation    3  CAD   CT today shows severe coronary calcifications  ? If this is causeing  some of symptoms  Will forward to J Hochrien    I did not start him on a statin right now given other changes above   He will need to be started on something    4  HFrEF    LVEF  35-40%, Volume status looks good  Keep on  Entresto and toprol XL    BP has limited titration   Presumed up to now to be nonischemic   With CT findings will need to reconside  5  aortic root enlargement 45 mm on CT.  Contniue to follow with periodic CTs    6  HTN controlled today on current regimen               Dispo:  No follow-ups on file.   Medication Adjustments/Labs and Tests Ordered: Current medicines are reviewed at length with the patient today.  Concerns regarding medicines are outlined above.  Tests Ordered: No orders of the defined types were placed in this encounter.  Medication Changes: No orders of the defined types were placed in this encounter.  Signed, Dietrich Pates, MD  10/07/2022 2:38 PM    [facility] [address] [place], [place], [place]  [address] Phone: [phone]; Fax: [phone]

## 2022-10-07 NOTE — Patient Instructions (Addendum)
Medication Instructions:  Lovenox should be started 12 hours after last eliquis dose  then lovenox once daily for 5 days... then pradaxa twice daily, and first pradaxa dose would be 24 hours after last lovenox injection    Lab Work:  If you have labs (blood work) drawn today and your tests are completely normal, you will receive your results only by: MyChart Message (if you have MyChart) OR A paper copy in the mail If you have any lab test that is abnormal or we need to change your treatment, we will call you to review the results.   Testing/Procedures:    Follow-Up: At [facility], you and your health needs are our priority.  As part of our continuing mission to provide you with exceptional heart care, we have created designated Provider Care Teams.  These Care Teams include your primary Cardiologist (physician) and Advanced Practice Providers (APPs -  Physician Assistants and Nurse Practitioners) who all work together to provide you with the care you need, when you need it.  We recommend signing up for the patient portal called "MyChart".  Sign up information is provided on this After Visit Summary.  MyChart is used to connect with patients for Virtual Visits (Telemedicine).  Patients are able to view lab/test results, encounter notes, upcoming appointments, etc.  Non-urgent messages can be sent to your provider as well.   To learn more about what you can do with MyChart, go to [email or web address].    Your next appointment: Follow up with Dr Nelly Laurence and Dr Antoine Poche   Enoxaparin Injection What is this medication? ENOXAPARIN (ee nox a PA rin) prevents or treats blood clots. It belongs to a group of medications called blood thinners. This medicine may be used for other purposes; ask your health care provider or pharmacist if you have questions. COMMON BRAND NAME(S): Lovenox What should I tell my care team before I take this medication? They need to know if you have any of  these conditions: Bleeding disorder, hemorrhage, or hemophilia Infection of the heart or heart valves Kidney disease Liver disease Previous stroke Prosthetic heart valve Recent surgery Recent delivery of a baby Stomach ulcers, other stomach or intestine problems An unusual or allergic reaction to enoxaparin, heparin, pork or pork products, other medications, foods, dyes, or preservatives Pregnant or trying to get pregnant Breastfeeding How should I use this medication? This medication is injected under the skin. You will be taught how to prepare and give it. For your therapy to work as well as possible, take each dose exactly as prescribed on the prescription label. Do not skip doses. Skipping or stopping this medication can increase your risk of a blood clot. Keep taking this medication unless your care team tells you to stop. It is important that you put your used needles and syringes in a special sharps container. Do not put them in a trash can. If you do not have a sharps container, call your care team to get one. This medication comes with INSTRUCTIONS FOR USE. Ask your pharmacist for directions on how to use this medicine. Read the information carefully. Talk to your care team if you have questions. Talk to your care team about use of this medication in children. Special care may be needed. Overdosage: If you think you have taken too much of this medicine contact a poison control center or emergency room at once. NOTE: This medicine is only for you. Do not share this medicine with others. What if I miss a  dose? If you miss a dose, take it as soon as you can. If it is almost time for your next dose, take only that dose. Do not take double or extra doses. What may interact with this medication? Aspirin and aspirin-like medications Certain medications that prevent or treat blood clots Dipyridamole NSAIDs, medications for pain and inflammation, such as ibuprofen or naproxen This list may  not describe all possible interactions. Give your health care provider a list of all the medicines, herbs, non-prescription drugs, or dietary supplements you use. Also tell them if you smoke, drink alcohol, or use illegal drugs. Some items may interact with your medicine. What should I watch for while using this medication? Visit your care team for regular checks on your progress. You may need blood work done while you are taking this medication. Your condition will be monitored carefully while you are receiving this medication. It is important not to miss any appointments. This medication comes in different strengths. Make sure you receive the same strength of medication with each refill. Contact your care team before using this medication if you are given a different strength. If you are going to need surgery or other procedure, tell your care team that you are using this medication. Using this medication for a long time may weaken your bones and increase the risk of bone fractures. Avoid sports and activities that might cause injury while you are using this medication. Severe falls or injuries can cause unseen bleeding. Be careful when using sharp tools or knives. Consider using an Neurosurgeon. Take special care brushing or flossing your teeth. Report any injuries, bruising, or red spots on the skin to your care team. Wear a medical ID bracelet or chain. Carry a card that describes your disease and details of your medication and dosage times. What side effects may I notice from receiving this medication? Side effects that you should report to your care team as soon as possible: Allergic reactions--skin rash, itching, hives, swelling of the face, lips, tongue, or throat Bleeding--bloody or black, tar-like stools, vomiting blood or brown material that looks like coffee grounds, red or dark brown urine, small red or purple spots on skin, unusual bruising or bleeding Side effects that usually do not  require medical attention (report to your care team if they continue or are bothersome): Pain, redness, or irritation at the injection site Swelling of the ankles, hands, or feet This list may not describe all possible side effects. Call your doctor for medical advice about side effects. You may report side effects to FDA at 1-800-FDA-1088. Where should I keep my medication? Keep out of the reach of children. Store at room temperature between 15 and 30 degrees C (59 and 86 degrees F). Do not freeze. If your injections have been specially prepared, you may need to store them in the refrigerator. Ask your pharmacist. Throw away any unused medication after the expiration date. NOTE: This sheet is a summary. It may not cover all possible information. If you have questions about this medicine, talk to your doctor, pharmacist, or health care provider.  2023 Elsevier/Gold Standard (2020-11-13 00:00:00)

## 2022-10-10 ENCOUNTER — Telehealth: Payer: Self-pay | Admitting: Internal Medicine

## 2022-10-10 ENCOUNTER — Telehealth: Payer: Self-pay | Admitting: *Deleted

## 2022-10-10 MED ORDER — RIVAROXABAN 20 MG PO TABS: 20.0000 mg | ORAL_TABLET | Freq: Every day | ORAL | 5 refills | Status: DC

## 2022-10-10 NOTE — Telephone Encounter (Signed)
Patient requesting to speak with Dewayne Hatch regarding refill      *STAT* If patient is at the pharmacy, call can be transferred to refill team.   1. Which medications need to be refilled? (please list name of each medication and dose if known) rivaroxaban (XARELTO) 20 MG TABS tablet   2. Which pharmacy/location (including street and city if local pharmacy) is [facility] information to [place]  3. Do they need a 30 day or 90 day supply? 90   This refill was not sent to the [place], requesting to speak with Dewayne Hatch regarding the refill.

## 2022-10-10 NOTE — Telephone Encounter (Signed)
Spoke with the patient and rescheduled his ablation for 3/22.

## 2022-10-10 NOTE — Telephone Encounter (Signed)
Spoke with the pt and advised him that the RX will be sent this morning and he has the 21 day starer pack that he picked up at the retail Pharmacy this past Friday 10/07/22.

## 2022-10-10 NOTE — Telephone Encounter (Signed)
-----  Message from Rollene Rotunda, MD sent at 10/08/2022 11:07 AM EST ----- Please move his appt up to Feb.  Thanks.  ----- Message ----- From: Pricilla Riffle, MD Sent: 10/07/2022  10:01 PM EST To: Rollene Rotunda, MD; Maurice Small, MD  Forwarding for your review   Saw Mr Carey today   CT showed PE   (pt on Eliquis   No missed doses) Initial plans to swtich to prradaxa.  Not covered   Switched to Xarelto     will need follow up   CT also showed severe CAD   Ca score over 8000    Needs a statin  I did not start

## 2022-10-10 NOTE — Telephone Encounter (Signed)
Spoke with pt, Follow up scheduled  

## 2022-10-14 ENCOUNTER — Encounter (HOSPITAL_COMMUNITY): Payer: Self-pay

## 2022-10-14 ENCOUNTER — Ambulatory Visit (HOSPITAL_COMMUNITY): Admit: 2022-10-14 | Payer: Medicare Other | Admitting: Cardiovascular Disease

## 2022-10-14 ENCOUNTER — Other Ambulatory Visit: Payer: Self-pay

## 2022-10-14 DIAGNOSIS — I4891 Unspecified atrial fibrillation: Secondary | ICD-10-CM

## 2022-10-14 SURGERY — ATRIAL FIBRILLATION ABLATION
Anesthesia: General

## 2022-10-26 DIAGNOSIS — R931 Abnormal findings on diagnostic imaging of heart and coronary circulation: Secondary | ICD-10-CM | POA: Insufficient documentation

## 2022-10-26 DIAGNOSIS — I2699 Other pulmonary embolism without acute cor pulmonale: Secondary | ICD-10-CM | POA: Insufficient documentation

## 2022-10-26 NOTE — Progress Notes (Signed)
Cardiology Office Note   Date:  10/27/2022   ID:  Richard Irwin, Richard Irwin 1945/12/28, MRN [id number]  PCP:  Lawerance Cruel, MD  Cardiologist:   [name], MD Referring:  Lawerance Cruel, MD   Chief Complaint  Patient presents with   Atrial Fibrillation      History of Present Illness: Richard Irwin is a 77 y.o. male who presents for evaluation of atrial fib.  He is referred by Lawerance Cruel, MD     Echo in 2021 demonstrated an EF of 50 - 55%.   There was moderate to  severe aortic enlargement measuring 47 mm.  This was at the time of syncope when he was hospitalized.  This turned out to be related to alcohol intoxication.  He does not drink any longer.  He has had atrial fib.  He had good rate control on Zio patch.  He does have a slightly reduced ejection fraction on echocardiography.  His EF had been 50 to 55% in 2021.  Now it is 35 to 40%.  Left atrial size is mildly enlarged.  There is some mild aortic insufficiency.  He has had recurrent atrial fib after cardioversion and was to have ablation.  However, on CT prior to ablation he was noted to have  a filling defect in the RUL consistent with PE despite being on adequate Eliquis.  He was going to changed to Pradaxa but the insurance would not cover this.  He was treated with a course of Lovenox and was changed to Xarelto.    He has been doing well.  He has not noticed any new shortness of breath, PND or orthopnea.  He had no palpitations, presyncope or syncope.  He had no weight gain or edema.     Past Medical History:  Diagnosis Date   Acute blood loss anemia    Anemia    Diverticulosis    Emphysema/COPD ([place])    Esophageal varices ([facility])    H/O: GI bleed    Hypertension    Insomnia    Status post colonoscopy with polypectomy    with bleed   Thoracic ascending aortic aneurysm ([place]) 05/07/2013   Tubular adenoma     Past Surgical History:  Procedure Laterality Date   APPENDECTOMY     CARDIOVERSION N/A  07/07/2022   Procedure: CARDIOVERSION;  Surgeon: Jerline Pain, MD;  Location: [facility];  Service: Cardiovascular;  Laterality: N/A;   COLONOSCOPY WITH PROPOFOL N/A 07/09/2014   Procedure: COLONOSCOPY WITH PROPOFOL;  Surgeon: Irene Shipper, MD;  Location: [facility];  Service: Endoscopy;  Laterality: N/A;   EXCISIONAL HEMORRHOIDECTOMY     HERNIA REPAIR     ROTATOR CUFF REPAIR       Current Outpatient Medications  Medication Sig Dispense Refill   alendronate (FOSAMAX) 70 MG tablet Take 70 mg by mouth every Saturday.     Cholecalciferol (VITAMIN D) 50 MCG (2000 UT) tablet Take 2,000 Units by mouth daily.     magnesium oxide (MAG-OX) 400 (240 Mg) MG tablet Take 400 mg by mouth daily.     rosuvastatin (CRESTOR) 20 MG tablet Take 1 tablet (20 mg total) by mouth daily. 90 tablet 3   sacubitril-valsartan (ENTRESTO) 49-51 MG Take 1 tablet by mouth 2 (two) times daily. 1/2 tablet 2 times daily     triamcinolone cream (KENALOG) 0.1 % 3 (three) times daily.     metoprolol succinate (TOPROL-XL) 25 MG 24 hr tablet Take 1  tablet (25 mg total) by mouth daily. 90 tablet 3   rivaroxaban (XARELTO) 20 MG TABS tablet Take 1 tablet (20 mg total) by mouth daily with supper. 30 tablet 11   No current facility-administered medications for this visit.    Allergies:   Lisinopril    ROS:  Please see the history of present illness.   Otherwise, review of systems are positive for none.   All other systems are reviewed and negative.    PHYSICAL EXAM: VS:  BP 138/79 Comment: right arm  Pulse 97   Ht 6' (1.829 m)   Wt 170 lb 3.2 oz (77.2 kg)   SpO2 98%   BMI 23.08 kg/m  , BMI Body mass index is 23.08 kg/m. GENERAL:  Well appearing NECK:  No jugular venous distention, waveform within normal limits, carotid upstroke brisk and symmetric, no bruits, no thyromegaly LUNGS:  Clear to auscultation bilaterally CHEST:  Unremarkable HEART:  PMI not displaced or sustained,S1 and S2 within normal limits, no S3,  no clicks, no rubs, no murmurs, irregular  ABD:  Flat, positive bowel sounds normal in frequency in pitch, no bruits, no rebound, no guarding, no midline pulsatile mass, no hepatomegaly, no splenomegaly EXT:  2 plus pulses throughout, no edema, no cyanosis no clubbing   EKG:  EKG is not ordered today.    Recent Labs: 06/06/2022: TSH 1.830 09/26/2022: BUN 11; Creatinine, Ser 0.91; Hemoglobin 15.2; Platelets 123; Potassium 4.4; Sodium 141    Lipid Panel No results found for: "CHOL", "TRIG", "HDL", "CHOLHDL", "VLDL", "LDLCALC", "LDLDIRECT"    Wt Readings from Last 3 Encounters:  10/27/22 170 lb 3.2 oz (77.2 kg)  10/07/22 171 lb 12.8 oz (77.9 kg)  08/30/22 170 lb 4 oz (77.2 kg)      Other studies Reviewed: Additional studies/ records that were reviewed today include: CT,  lower extremity Doppler, labs Review of the above records demonstrates:  Please see elsewhere in the note.     ASSESSMENT AND PLAN:  Atrial Fib: Richard Irwin has a CHA2DS2 - VASc score of 4.     He is scheduled for ablation in March.  Continue current therapy.   PE:  He is now on Xarelto and follow-up CT has been scheduled.  Venous Dopplers were negative.     Elevated coronary calcium: Eventually I will probably screen him with stress testing.  He has no symptoms.  I am going to start aggressive risk reduction and he is going to start Crestor 20 mg daily.  Aortic root enlargement:   This was 48 mm on CT.    This will be evaluated on CT next month when he has screening for the PE.   HTN: His blood pressure is being treated in the context of treating his cardiomypathy.  Cardiomyopathy:  I think this is probably related to the A-fib and the alcohol.  The next step will be up titration of his beta-blocker.  I will increase to Toprol 50 mg daily.   Current medicines are reviewed at length with the patient today.  The patient does not have concerns regarding medicines.  The following changes have been made:  As above  Labs/ tests ordered today include: None  Orders Placed This Encounter  Procedures   EKG 12-Lead     Disposition:   FU with APP or me in April after ablation.    Signed, [name], MD  10/27/2022 8:55 AM    [place]

## 2022-10-27 ENCOUNTER — Encounter: Payer: Self-pay | Admitting: Cardiology

## 2022-10-27 ENCOUNTER — Ambulatory Visit: Payer: Medicare Other | Attending: Cardiology | Admitting: Cardiology

## 2022-10-27 VITALS — BP 138/79 | HR 97 | Ht 72.0 in | Wt 170.2 lb

## 2022-10-27 DIAGNOSIS — I4819 Other persistent atrial fibrillation: Secondary | ICD-10-CM | POA: Diagnosis not present

## 2022-10-27 DIAGNOSIS — R931 Abnormal findings on diagnostic imaging of heart and coronary circulation: Secondary | ICD-10-CM

## 2022-10-27 DIAGNOSIS — I2699 Other pulmonary embolism without acute cor pulmonale: Secondary | ICD-10-CM | POA: Diagnosis not present

## 2022-10-27 DIAGNOSIS — I10 Essential (primary) hypertension: Secondary | ICD-10-CM

## 2022-10-27 DIAGNOSIS — I429 Cardiomyopathy, unspecified: Secondary | ICD-10-CM | POA: Diagnosis not present

## 2022-10-27 MED ORDER — RIVAROXABAN 20 MG PO TABS: 20.0000 mg | ORAL_TABLET | Freq: Every day | ORAL | 11 refills | Status: DC

## 2022-10-27 MED ORDER — ROSUVASTATIN CALCIUM 20 MG PO TABS: 20.0000 mg | ORAL_TABLET | Freq: Every day | ORAL | 3 refills | Status: DC

## 2022-10-27 MED ORDER — RIVAROXABAN 20 MG PO TABS: 20.0000 mg | ORAL_TABLET | Freq: Every day | ORAL | 0 refills | Status: AC

## 2022-10-27 MED ORDER — METOPROLOL SUCCINATE ER 25 MG PO TB24: 25.0000 mg | ORAL_TABLET | Freq: Every day | ORAL | 3 refills | Status: DC

## 2022-10-27 NOTE — Patient Instructions (Signed)
Medication Instructions:  INCREASE metoprolol succinate to 50mg  daily  START rosuvastatin 20mg  daily   *If you need a refill on your cardiac medications before your next appointment, please call your pharmacy*   Follow-Up: At [facility], you and your health needs are our priority.  As part of our continuing mission to provide you with exceptional heart care, we have created designated Provider Care Teams.  These Care Teams include your primary Cardiologist (physician) and Advanced Practice Providers (APPs -  Physician Assistants and Nurse Practitioners) who all work together to provide you with the care you need, when you need it.  We recommend signing up for the patient portal called "MyChart".  Sign up information is provided on this After Visit Summary.  MyChart is used to connect with patients for Virtual Visits (Telemedicine).  Patients are able to view lab/test results, encounter notes, upcoming appointments, etc.  Non-urgent messages can be sent to your provider as well.   To learn more about what you can do with MyChart, go to [email or web address].    Your next appointment:    April 2024 with Dr. Percival Spanish -- after ablation

## 2022-10-27 NOTE — Addendum Note (Signed)
Addended by: Fidel Levy on: 10/27/2022 08:59 AM   Modules accepted: Orders

## 2022-11-08 ENCOUNTER — Encounter: Payer: Self-pay | Admitting: Cardiovascular Disease

## 2022-11-08 ENCOUNTER — Ambulatory Visit: Payer: Medicare Other | Attending: Cardiovascular Disease | Admitting: Cardiovascular Disease

## 2022-11-08 VITALS — BP 110/70 | HR 107 | Ht 72.0 in | Wt 168.0 lb

## 2022-11-08 DIAGNOSIS — I4891 Unspecified atrial fibrillation: Secondary | ICD-10-CM

## 2022-11-08 NOTE — Patient Instructions (Addendum)
Medication Instructions:  Your physician recommends that you continue on your current medications as directed. Please refer to the Current Medication list given to you today.   *If you need a refill on your cardiac medications before your next appointment, please call your pharmacy*   Lab Work: On November 21, 2022: CBC, BMET (you do not need to be fasting) You may come to the lab any time between 7:30am and 4:30pm. If you have labs (blood work) drawn today and your tests are completely normal, you will receive your results only by: [place] (if you have MyChart) OR A paper copy in the mail If you have any lab test that is abnormal or we need to change your treatment, we will call you to review the results.   Testing/Procedures: Your physician has requested that you have cardiac CT. This is scheduled for 12/02/2022 at 1:30 PM. Cardiac computed tomography (CT) is a painless test that uses an x-ray machine to take clear, detailed pictures of your heart. For further information please visit [email or web address]. Please follow instruction sheet as given.   Your physician has recommended that you have an ablation. Catheter ablation is a medical procedure used to treat some cardiac arrhythmias (irregular heartbeats). During catheter ablation, a long, thin, flexible tube is put into a blood vessel in your groin (upper thigh), or neck. This tube is called an ablation catheter. It is then guided to your heart through the blood vessel. Radio frequency waves destroy small areas of heart tissue where abnormal heartbeats may cause an arrhythmia to start.    See instruction letter given to you at your visit today.     Follow-Up: At [facility], you and your health needs are our priority.  As part of our continuing mission to provide you with exceptional heart care, we have created designated Provider Care Teams.  These Care Teams include your primary Cardiologist (physician) and Advanced Practice  Providers (APPs -  Physician Assistants and Nurse Practitioners) who all work together to provide you with the care you need, when you need it.   Your next appointment:   4 week(s) after your ablation   Provider:   You will follow up in the [facility] located at [facility]. Your provider will be: Roderic Palau, NP or Clint R. Fenton, PA-C

## 2022-11-08 NOTE — Progress Notes (Signed)
Electrophysiology Office Note:    Date:  11/08/2022   ID:  Richard, Irwin 03/01/1946, MRN [id number]  PCP:  Richard Irwin, [place] [facility] Cardiologist:  [name], MD Electrophysiologist:  Melida Quitter, MD     Referring MD: Richard Cruel, MD   History of Present Illness:    Richard Irwin is a 77 y.o. male with a hx of CHFrEF (EF 35-40%), HTN, PAF and other issues listed below referred to EP clinic for management of persistent AF.  He has had an episode for syncope in 2021 in the setting of alcohol withdrawal.  During the admission he had an episode of NSVT. EF was normal at that time. He quit drinking and remains abstinent.   He saw Dr. Percival Irwin in September of this year after AF was detected incidentally during a routine visit to the [place]. Repeat TTE showed an EF drop to 35-40%. Eliquis and betablocker were started. We was not particularly symptomatic. DCCV was performed, but AF recurred prior to his next follow-up.  He reports that he feels well today - no chest pain, syncope, near-syncope, change in baseline shortness of breath with exertion.    Past Medical History:  Diagnosis Date   Acute blood loss anemia    Anemia    Diverticulosis    Emphysema/COPD ([place])    Esophageal varices (HCC)    H/O: GI bleed    Hypertension    Insomnia    Status post colonoscopy with polypectomy    with bleed   Thoracic ascending aortic aneurysm ([place]) 05/07/2013   Tubular adenoma     Past Surgical History:  Procedure Laterality Date   APPENDECTOMY     CARDIOVERSION N/A 07/07/2022   Procedure: CARDIOVERSION;  Surgeon: Jerline Pain, MD;  Location: [facility];  Service: Cardiovascular;  Laterality: N/A;   COLONOSCOPY WITH PROPOFOL N/A 07/09/2014   Procedure: COLONOSCOPY WITH PROPOFOL;  Surgeon: Irene Shipper, MD;  Location: [facility];  Service: Endoscopy;  Laterality: N/A;   EXCISIONAL HEMORRHOIDECTOMY     HERNIA REPAIR     ROTATOR  CUFF REPAIR      Current Medications: Current Meds  Medication Sig   alendronate (FOSAMAX) 70 MG tablet Take 70 mg by mouth every Saturday.   Cholecalciferol (VITAMIN D) 50 MCG (2000 UT) tablet Take 2,000 Units by mouth daily.   magnesium oxide (MAG-OX) 400 (240 Mg) MG tablet Take 400 mg by mouth daily.   metoprolol succinate (TOPROL-XL) 100 MG 24 hr tablet Take 100 mg by mouth daily. Pt takes 1/2 tablet daily   rivaroxaban (XARELTO) 20 MG TABS tablet Take 1 tablet (20 mg total) by mouth daily with supper.   rosuvastatin (CRESTOR) 20 MG tablet Take 1 tablet (20 mg total) by mouth daily.   sacubitril-valsartan (ENTRESTO) 49-51 MG Take 1 tablet by mouth 2 (two) times daily. 1/2 tablet 2 times daily   triamcinolone cream (KENALOG) 0.1 % 3 (three) times daily.     Allergies:   Lisinopril   Social History   Socioeconomic History   Marital status: Married    Spouse name: Not on file   Number of children: 1   Years of education: Not on file   Highest education level: Not on file  Occupational History   Occupation: retired Press photographer  Tobacco Use   Smoking status: Never   Smokeless tobacco: Never  Vaping Use   Vaping Use: Never used  Substance and Sexual Activity  Alcohol use: Not Currently    Comment: beer 2-4 daily   Drug use: No   Sexual activity: Yes  Other Topics Concern   Not on file  Social History Narrative   Not on file   Social Determinants of Health   Financial Resource Strain: Not on file  Food Insecurity: Not on file  Transportation Needs: Not on file  Physical Activity: Not on file  Stress: Not on file  Social Connections: Not on file     Family History: The patient's family history includes Cirrhosis in his father; Lung cancer in his mother. There is no history of Colon cancer, Pancreatic cancer, Rectal cancer, or Stomach cancer.  ROS:   Please see the history of present illness.    All other systems reviewed and are negative.  EKGs/Labs/Other Studies  Reviewed Today:     3 day Ziopatch AF: HR 47-171, AVG 91  Rare PVCs, sometimes occurring in bigeminy  TTE 06/20/2022 EF 35-40%, mildly dilated LA  EKG:  Last EKG results: today - Atrial fibrillation with RVR, PVCs. Incomplete right bundle branch block   Recent Labs: 06/06/2022: TSH 1.830 09/26/2022: BUN 11; Creatinine, Ser 0.91; Hemoglobin 15.2; Platelets 123; Potassium 4.4; Sodium 141     Physical Exam:    VS:  BP 110/70   Pulse (!) 107   Ht 6' (1.829 m)   Wt 168 lb (76.2 kg)   SpO2 97%   BMI 22.78 kg/m     Wt Readings from Last 3 Encounters:  11/08/22 168 lb (76.2 kg)  10/27/22 170 lb 3.2 oz (77.2 kg)  10/07/22 171 lb 12.8 oz (77.9 kg)     GEN: Well nourished, well developed in no acute distress CARDIAC: Irregular rhythm, no murmurs, rubs, gallops RESPIRATORY:  Normal work of breathing MUSCULOSKELETAL: no edema    ASSESSMENT & PLAN:    Atrial fibrillation: persistent, likely the cause of CHFrEF. NYHA is I-II. I think rhythm control is indicated to prevent progression of CHF. We discussed the indication, rationale, logistics, anticipated benefits, and potential risks of the ablation procedure including but not limited to -- bleed at the groin access site, chest pain, damage to nearby organs such as the diaphragm, lungs, or esophagus, need for a drainage tube, or prolonged hospitalization. I explained that the risk for stroke, heart attack, need for open chest surgery, or even death is very low but not zero. he  expressed understanding and wishes to proceed.       - continue xarelto PE: s/p course of lovenox, now on xarelto. Venous dopplers were negative CHFrEF: on metoprol succinate 25, entresto 49-51. Pt appears well-compensated today.      Medication Adjustments/Labs and Tests Ordered: Current medicines are reviewed at length with the patient today.  Concerns regarding medicines are outlined above.  No orders of the defined types were placed in this  encounter.  No orders of the defined types were placed in this encounter.    Signed, Melida Quitter, MD  11/08/2022 8:43 AM    [place]

## 2022-11-11 ENCOUNTER — Ambulatory Visit (HOSPITAL_COMMUNITY): Payer: Medicare Other | Admitting: Physician Assistant

## 2022-11-21 ENCOUNTER — Ambulatory Visit: Payer: Medicare Other | Attending: Cardiovascular Disease

## 2022-11-21 DIAGNOSIS — I4891 Unspecified atrial fibrillation: Secondary | ICD-10-CM | POA: Diagnosis not present

## 2022-11-21 LAB — CBC WITH DIFFERENTIAL/PLATELET

## 2022-11-22 LAB — CBC WITH DIFFERENTIAL/PLATELET
Basophils Absolute: 0.1 10*3/uL (ref 0.0–0.2)
Basos: 1 %
EOS (ABSOLUTE): 0.2 10*3/uL (ref 0.0–0.4)
Eos: 3 %
Hematocrit: 45.4 % (ref 37.5–51.0)
Hemoglobin: 15.2 g/dL (ref 13.0–17.7)
Immature Grans (Abs): 0 10*3/uL (ref 0.0–0.1)
Immature Granulocytes: 0 %
Lymphocytes Absolute: 2.4 10*3/uL (ref 0.7–3.1)
Lymphs: 42 %
MCH: 32.2 pg (ref 26.6–33.0)
MCHC: 33.5 g/dL (ref 31.5–35.7)
MCV: 96 fL (ref 79–97)
Monocytes Absolute: 0.5 10*3/uL (ref 0.1–0.9)
Monocytes: 9 %
Neutrophils Absolute: 2.5 10*3/uL (ref 1.4–7.0)
Neutrophils: 45 %
Platelets: 115 10*3/uL — ABNORMAL LOW (ref 150–450)
RBC: 4.72 x10E6/uL (ref 4.14–5.80)
RDW: 12.2 % (ref 11.6–15.4)
WBC: 5.6 10*3/uL (ref 3.4–10.8)

## 2022-11-22 LAB — BASIC METABOLIC PANEL
BUN/Creatinine Ratio: 9 — ABNORMAL LOW (ref 10–24)
BUN: 9 mg/dL (ref 8–27)
CO2: 24 mmol/L (ref 20–29)
Calcium: 9.5 mg/dL (ref 8.6–10.2)
Chloride: 103 mmol/L (ref 96–106)
Creatinine, Ser: 0.97 mg/dL (ref 0.76–1.27)
Glucose: 135 mg/dL — ABNORMAL HIGH (ref 70–99)
Potassium: 4.4 mmol/L (ref 3.5–5.2)
Sodium: 139 mmol/L (ref 134–144)
eGFR: 81 mL/min/{1.73_m2} (ref >59)

## 2022-11-30 DIAGNOSIS — M549 Dorsalgia, unspecified: Secondary | ICD-10-CM | POA: Diagnosis not present

## 2022-11-30 DIAGNOSIS — D696 Thrombocytopenia, unspecified: Secondary | ICD-10-CM | POA: Diagnosis not present

## 2022-11-30 DIAGNOSIS — M810 Age-related osteoporosis without current pathological fracture: Secondary | ICD-10-CM | POA: Diagnosis not present

## 2022-11-30 DIAGNOSIS — M353 Polymyalgia rheumatica: Secondary | ICD-10-CM | POA: Diagnosis not present

## 2022-11-30 DIAGNOSIS — M1990 Unspecified osteoarthritis, unspecified site: Secondary | ICD-10-CM | POA: Diagnosis not present

## 2022-12-01 ENCOUNTER — Telehealth (HOSPITAL_COMMUNITY): Payer: Self-pay | Admitting: Emergency Medicine

## 2022-12-01 NOTE — Telephone Encounter (Signed)
Attempted to call patient regarding upcoming cardiac CT appointment. °Left message on voicemail with name and callback number °Shyam Dawson RN Navigator Cardiac Imaging °[facility] °[phone] Office °[phone] Cell ° °

## 2022-12-01 NOTE — Telephone Encounter (Signed)
Attempted to call patient regarding upcoming cardiac CT appointment. °Left message on voicemail with name and callback number °Kareli Hossain RN Navigator Cardiac Imaging °[facility] °[phone] Office °[phone] Cell ° °

## 2022-12-02 ENCOUNTER — Ambulatory Visit (HOSPITAL_COMMUNITY)
Admission: RE | Admit: 2022-12-02 | Discharge: 2022-12-02 | Disposition: A | Discharge status: Home / Self Care | Payer: Medicare Other | Source: Ambulatory Visit | Attending: Cardiovascular Disease | Admitting: Cardiovascular Disease

## 2022-12-02 DIAGNOSIS — I4891 Unspecified atrial fibrillation: Secondary | ICD-10-CM | POA: Insufficient documentation

## 2022-12-02 MED ORDER — IOHEXOL 350 MG/ML SOLN
95.0000 mL | Freq: Once | INTRAVENOUS | Status: AC | PRN
  Administered 2022-12-02: 95 mL via INTRAVENOUS

## 2022-12-05 ENCOUNTER — Telehealth: Payer: Self-pay | Admitting: Cardiology

## 2022-12-05 NOTE — Telephone Encounter (Signed)
*  STAT* If patient is at the pharmacy, call can be transferred to refill team.   1. Which medications need to be refilled? (please list name of each medication and dose if known)   metoprolol succinate (TOPROL-XL) 25 MG 24 hr tablet    2. Which pharmacy/location (including street and city if local pharmacy) is medication to be sent to?  [id number] - [place], [place] [address]    3. Do they need a 30 day or 90 day supply? 90 day

## 2022-12-07 MED ORDER — METOPROLOL SUCCINATE ER 50 MG PO TB24: 50.0000 mg | ORAL_TABLET | Freq: Every day | ORAL | 3 refills | Status: DC

## 2022-12-08 ENCOUNTER — Telehealth: Payer: Self-pay | Admitting: Cardiovascular Disease

## 2022-12-08 NOTE — Telephone Encounter (Signed)
Pt would like a callback from nurse regarding questions that he has pertaining to Cath that's scheduled for tomorrow. Please advise

## 2022-12-08 NOTE — Pre-Procedure Instructions (Signed)
Instructed patient on the following items: Arrival time 0830 Nothing to eat or drink after midnight No meds AM of procedure Responsible person to drive you home and stay with you for 24 hrs  Have you missed any doses of anti-coagulant Xarelto- hasn't missed any doses    

## 2022-12-08 NOTE — Telephone Encounter (Signed)
Returned call to patient. He states he has already spoken with someone and all questions have been answered. No further needs voiced at this time.  Patient expressed appreciation for follow-up.

## 2022-12-09 ENCOUNTER — Other Ambulatory Visit: Payer: Self-pay

## 2022-12-09 ENCOUNTER — Encounter (HOSPITAL_COMMUNITY)
Admission: RE | Disposition: A | Discharge status: Home / Self Care | Payer: Self-pay | Source: Home / Self Care | Attending: Cardiovascular Disease

## 2022-12-09 ENCOUNTER — Ambulatory Visit (HOSPITAL_BASED_OUTPATIENT_CLINIC_OR_DEPARTMENT_OTHER): Payer: Medicare Other | Admitting: Anesthesiology

## 2022-12-09 ENCOUNTER — Other Ambulatory Visit (HOSPITAL_COMMUNITY): Payer: Self-pay

## 2022-12-09 ENCOUNTER — Ambulatory Visit (HOSPITAL_COMMUNITY): Payer: Medicare Other | Admitting: Anesthesiology

## 2022-12-09 ENCOUNTER — Ambulatory Visit (HOSPITAL_COMMUNITY)
Admission: RE | Admit: 2022-12-09 | Discharge: 2022-12-09 | Disposition: A | Discharge status: Home / Self Care | Payer: Medicare Other | Attending: Cardiovascular Disease | Admitting: Cardiovascular Disease

## 2022-12-09 DIAGNOSIS — J449 Chronic obstructive pulmonary disease, unspecified: Secondary | ICD-10-CM

## 2022-12-09 DIAGNOSIS — I5022 Chronic systolic (congestive) heart failure: Secondary | ICD-10-CM | POA: Diagnosis not present

## 2022-12-09 DIAGNOSIS — Z7901 Long term (current) use of anticoagulants: Secondary | ICD-10-CM | POA: Insufficient documentation

## 2022-12-09 DIAGNOSIS — I110 Hypertensive heart disease with heart failure: Secondary | ICD-10-CM | POA: Insufficient documentation

## 2022-12-09 DIAGNOSIS — I2510 Atherosclerotic heart disease of native coronary artery without angina pectoris: Secondary | ICD-10-CM | POA: Diagnosis not present

## 2022-12-09 DIAGNOSIS — I10 Essential (primary) hypertension: Secondary | ICD-10-CM

## 2022-12-09 DIAGNOSIS — I739 Peripheral vascular disease, unspecified: Secondary | ICD-10-CM | POA: Diagnosis not present

## 2022-12-09 DIAGNOSIS — I4891 Unspecified atrial fibrillation: Secondary | ICD-10-CM

## 2022-12-09 DIAGNOSIS — Z79899 Other long term (current) drug therapy: Secondary | ICD-10-CM | POA: Diagnosis not present

## 2022-12-09 DIAGNOSIS — I4819 Other persistent atrial fibrillation: Secondary | ICD-10-CM | POA: Diagnosis not present

## 2022-12-09 HISTORY — PX: ATRIAL FIBRILLATION ABLATION: EP1191

## 2022-12-09 SURGERY — ATRIAL FIBRILLATION ABLATION
Anesthesia: General

## 2022-12-09 MED ORDER — ONDANSETRON HCL 4 MG/2ML IJ SOLN: 4.0000 mg | Freq: Four times a day (QID) | INTRAMUSCULAR | Status: DC | PRN

## 2022-12-09 MED ORDER — FENTANYL CITRATE (PF) 100 MCG/2ML IJ SOLN
INTRAMUSCULAR | Status: DC | PRN
  Administered 2022-12-09 (×2): 50 ug via INTRAVENOUS

## 2022-12-09 MED ORDER — DOBUTAMINE INFUSION FOR EP/ECHO/NUC (1000 MCG/ML)
INTRAVENOUS | Status: DC | PRN
  Administered 2022-12-09: 20 ug/kg/min via INTRAVENOUS

## 2022-12-09 MED ORDER — HEPARIN SODIUM (PORCINE) 1000 UNIT/ML IJ SOLN
INTRAMUSCULAR | Status: DC | PRN
  Administered 2022-12-09: 14000 [IU] via INTRAVENOUS

## 2022-12-09 MED ORDER — PANTOPRAZOLE SODIUM 40 MG PO TBEC
40.0000 mg | DELAYED_RELEASE_TABLET | Freq: Every day | ORAL | 0 refills | Status: DC
  Filled 2022-12-09: qty 30, 30d supply, fill #0

## 2022-12-09 MED ORDER — ACETAMINOPHEN 325 MG PO TABS: 650.0000 mg | ORAL_TABLET | ORAL | Status: DC | PRN

## 2022-12-09 MED ORDER — ACETAMINOPHEN 500 MG PO TABS: 1000.0000 mg | ORAL_TABLET | Freq: Once | ORAL | Status: AC

## 2022-12-09 MED ORDER — PROTAMINE SULFATE 10 MG/ML IV SOLN
INTRAVENOUS | Status: DC | PRN
  Administered 2022-12-09: 50 mg via INTRAVENOUS

## 2022-12-09 MED ORDER — SODIUM CHLORIDE 0.9 % IV SOLN: 250.0000 mL | INTRAVENOUS | Status: DC | PRN

## 2022-12-09 MED ORDER — HEPARIN SODIUM (PORCINE) 1000 UNIT/ML IJ SOLN
INTRAMUSCULAR | Status: AC
  Filled 2022-12-09: qty 10

## 2022-12-09 MED ORDER — PHENYLEPHRINE HCL-NACL 20-0.9 MG/250ML-% IV SOLN
INTRAVENOUS | Status: DC | PRN
  Administered 2022-12-09: 40 ug/min via INTRAVENOUS

## 2022-12-09 MED ORDER — DOBUTAMINE INFUSION FOR EP/ECHO/NUC (1000 MCG/ML)
INTRAVENOUS | Status: AC
  Filled 2022-12-09: qty 250

## 2022-12-09 MED ORDER — SODIUM CHLORIDE 0.9 % IV SOLN: INTRAVENOUS | Status: DC

## 2022-12-09 MED ORDER — SODIUM CHLORIDE 0.9% FLUSH: 3.0000 mL | INTRAVENOUS | Status: DC | PRN

## 2022-12-09 MED ORDER — ROCURONIUM BROMIDE 10 MG/ML (PF) SYRINGE
PREFILLED_SYRINGE | INTRAVENOUS | Status: DC | PRN
  Administered 2022-12-09: 60 mg via INTRAVENOUS
  Administered 2022-12-09 (×2): 20 mg via INTRAVENOUS

## 2022-12-09 MED ORDER — ACETAMINOPHEN 500 MG PO TABS
ORAL_TABLET | ORAL | Status: AC
  Administered 2022-12-09: 1000 mg via ORAL
  Filled 2022-12-09: qty 2

## 2022-12-09 MED ORDER — COLCHICINE 0.6 MG PO TABS
0.6000 mg | ORAL_TABLET | Freq: Two times a day (BID) | ORAL | 0 refills | Status: DC
  Filled 2022-12-09: qty 10, 5d supply, fill #0

## 2022-12-09 MED ORDER — PHENYLEPHRINE 80 MCG/ML (10ML) SYRINGE FOR IV PUSH (FOR BLOOD PRESSURE SUPPORT)
PREFILLED_SYRINGE | INTRAVENOUS | Status: DC | PRN
  Administered 2022-12-09 (×4): 80 ug via INTRAVENOUS
  Administered 2022-12-09: 160 ug via INTRAVENOUS
  Administered 2022-12-09: 80 ug via INTRAVENOUS

## 2022-12-09 MED ORDER — EPHEDRINE SULFATE-NACL 50-0.9 MG/10ML-% IV SOSY
PREFILLED_SYRINGE | INTRAVENOUS | Status: DC | PRN
  Administered 2022-12-09: 5 mg via INTRAVENOUS

## 2022-12-09 MED ORDER — LIDOCAINE 2% (20 MG/ML) 5 ML SYRINGE
INTRAMUSCULAR | Status: DC | PRN
  Administered 2022-12-09: 80 mg via INTRAVENOUS

## 2022-12-09 MED ORDER — RIVAROXABAN 20 MG PO TABS
20.0000 mg | ORAL_TABLET | Freq: Every day | ORAL | Status: DC
  Administered 2022-12-09: 20 mg via ORAL
  Filled 2022-12-09: qty 1

## 2022-12-09 MED ORDER — DEXAMETHASONE SODIUM PHOSPHATE 10 MG/ML IJ SOLN
INTRAMUSCULAR | Status: DC | PRN
  Administered 2022-12-09: 5 mg via INTRAVENOUS

## 2022-12-09 MED ORDER — SODIUM CHLORIDE 0.9% FLUSH: 3.0000 mL | Freq: Two times a day (BID) | INTRAVENOUS | Status: DC

## 2022-12-09 MED ORDER — HEPARIN SODIUM (PORCINE) 1000 UNIT/ML IJ SOLN
INTRAMUSCULAR | Status: DC | PRN
  Administered 2022-12-09: 1000 [IU] via INTRAVENOUS

## 2022-12-09 MED ORDER — ETOMIDATE 2 MG/ML IV SOLN
INTRAVENOUS | Status: DC | PRN
  Administered 2022-12-09: 18 mg via INTRAVENOUS

## 2022-12-09 MED ORDER — HEPARIN (PORCINE) IN NACL 1000-0.9 UT/500ML-% IV SOLN
INTRAVENOUS | Status: DC | PRN
  Administered 2022-12-09 (×4): 500 mL

## 2022-12-09 MED ORDER — SUGAMMADEX SODIUM 200 MG/2ML IV SOLN
INTRAVENOUS | Status: DC | PRN
  Administered 2022-12-09: 200 mg via INTRAVENOUS

## 2022-12-09 MED ORDER — ONDANSETRON HCL 4 MG/2ML IJ SOLN
INTRAMUSCULAR | Status: DC | PRN
  Administered 2022-12-09: 4 mg via INTRAVENOUS

## 2022-12-09 SURGICAL SUPPLY — 19 items
BLANKET WARM UNDERBOD FULL ACC (MISCELLANEOUS) ×2 IMPLANT
CATH ABLAT QDOT MICRO BI TC FJ (CATHETERS) IMPLANT
CATH OCTARAY 2.0 F 3-3-3-3-3 (CATHETERS) IMPLANT
CATH PIGTAIL STEERABLE D1 8.7 (WIRE) IMPLANT
CATH S-M CIRCA TEMP PROBE (CATHETERS) IMPLANT
CATH SOUNDSTAR ECO REPROCESSED (CATHETERS) IMPLANT
CATH WEB BI DIR CSDF CRV REPRO (CATHETERS) IMPLANT
CLOSURE PERCLOSE PROSTYLE (VASCULAR PRODUCTS) IMPLANT
COVER SWIFTLINK CONNECTOR (BAG) ×2 IMPLANT
DEVICE CLOSURE MYNXGRIP 6/7F (Vascular Products) IMPLANT
PACK EP LATEX FREE (CUSTOM PROCEDURE TRAY) ×1
PACK EP LF (CUSTOM PROCEDURE TRAY) ×2 IMPLANT
PAD DEFIB RADIO PHYSIO CONN (PAD) ×2 IMPLANT
PATCH CARTO3 (PAD) IMPLANT
SHEATH CARTO VIZIGO MED CURVE (SHEATH) IMPLANT
SHEATH PINNACLE 8F 10CM (SHEATH) IMPLANT
SHEATH PINNACLE 9F 10CM (SHEATH) IMPLANT
SHEATH PROBE COVER 6X72 (BAG) IMPLANT
TUBING SMART ABLATE COOLFLOW (TUBING) IMPLANT

## 2022-12-09 NOTE — Anesthesia Procedure Notes (Signed)
Procedure Name: Intubation Date/Time: 12/09/2022 11:02 AM  Performed by: Janene Harvey, CRNAPre-anesthesia Checklist: Patient identified, Emergency Drugs available, Suction available and Patient being monitored Patient Re-evaluated:Patient Re-evaluated prior to induction Oxygen Delivery Method: Circle system utilized Preoxygenation: Pre-oxygenation with 100% oxygen Induction Type: IV induction Ventilation: Mask ventilation without difficulty Laryngoscope Size: Mac and 4 Tube type: Oral Tube size: 7.5 mm Number of attempts: 1 Airway Equipment and Method: Stylet and Oral airway Placement Confirmation: ETT inserted through vocal cords under direct vision, positive ETCO2 and breath sounds checked- equal and bilateral Secured at: 22 cm Tube secured with: Tape Dental Injury: Teeth and Oropharynx as per pre-operative assessment and Injury to lip  Comments: Intubation by EMT student Maeve Deltano - Dr. Gifford Shave supervising Small upper right lip laceration noted post intubation

## 2022-12-09 NOTE — Transfer of Care (Signed)
Immediate Anesthesia Transfer of Care Note  Patient: Richard Irwin  Procedure(s) Performed: ATRIAL FIBRILLATION ABLATION  Patient Location: PACU  Anesthesia Type:General  Level of Consciousness: awake, alert , and oriented  Airway & Oxygen Therapy: Patient Spontanous Breathing and Patient connected to nasal cannula oxygen  Post-op Assessment: Report given to RN and Post -op Vital signs reviewed and stable  Post vital signs: Reviewed and stable  Last Vitals:  Vitals Value Taken Time  BP 107/80 12/09/22 1345  Temp 36.6 C 12/09/22 1348  Pulse 76 12/09/22 1348  Resp 15 12/09/22 1348  SpO2 99 % 12/09/22 1348  Vitals shown include unvalidated device data.  Last Pain:  Vitals:   12/09/22 0908  TempSrc: Temporal  PainSc:          Complications: There were no known notable events for this encounter.

## 2022-12-09 NOTE — Progress Notes (Signed)
Per Dr. Myles Gip, patient can ambulate after two hours of bedrest, take Xarelto at 1700 and be discharged at 1730. Orders followed.

## 2022-12-09 NOTE — Anesthesia Postprocedure Evaluation (Signed)
Anesthesia Post Note  Patient: Richard Irwin  Procedure(s) Performed: ATRIAL FIBRILLATION ABLATION     Patient location during evaluation: Cath Lab Anesthesia Type: General Level of consciousness: awake and alert Pain management: pain level controlled Vital Signs Assessment: post-procedure vital signs reviewed and stable Respiratory status: spontaneous breathing, nonlabored ventilation, respiratory function stable and patient connected to nasal cannula oxygen Cardiovascular status: blood pressure returned to baseline and stable Postop Assessment: no apparent nausea or vomiting Anesthetic complications: no   There were no known notable events for this encounter.  Last Vitals:  Vitals:   12/09/22 1410 12/09/22 1416  BP: 97/73   Pulse: 66   Resp: 13   Temp:  36.6 C  SpO2: 94%     Last Pain:  Vitals:   12/09/22 0908  TempSrc: Temporal  PainSc:                  [place] Lighter

## 2022-12-09 NOTE — Anesthesia Preprocedure Evaluation (Addendum)
Anesthesia Evaluation  Patient identified by MRN, date of birth, ID band Patient awake    Reviewed: Allergy & Precautions, NPO status , Patient's Chart, lab work & pertinent test results, reviewed documented beta blocker date and time   Airway Mallampati: II  TM Distance: >3 FB Neck ROM: Full    Dental  (+) Teeth Intact, Dental Advisory Given   Pulmonary COPD PE: s/p course of lovenox, now on xarelto   Pulmonary exam normal breath sounds clear to auscultation       Cardiovascular hypertension, Pt. on home beta blockers and Pt. on medications (-) angina + CAD and + Peripheral Vascular Disease (TAA)  (-) Past MI and (-) Cardiac Stents + dysrhythmias Atrial Fibrillation  Rhythm:Irregular Rate:Abnormal  CHFrEF (EF 35-40%)   Neuro/Psych negative neurological ROS  negative psych ROS   GI/Hepatic negative GI ROS,,,(+)   Esophageal Varices      Endo/Other  negative endocrine ROS    Renal/GU negative Renal ROS     Musculoskeletal negative musculoskeletal ROS (+)    Abdominal   Peds  Hematology  (+) Blood dyscrasia (Xarelto)   Anesthesia Other Findings Day of surgery medications reviewed with the patient.  Reproductive/Obstetrics                              Anesthesia Physical Anesthesia Plan  ASA: 4  Anesthesia Plan: General   Post-op Pain Management: Tylenol PO (pre-op)*   Induction: Intravenous  PONV Risk Score and Plan: 2 and Dexamethasone and Ondansetron  Airway Management Planned: Oral ETT  Additional Equipment:   Intra-op Plan:   Post-operative Plan: Extubation in OR  Informed Consent: I have reviewed the patients History and Physical, chart, labs and discussed the procedure including the risks, benefits and alternatives for the proposed anesthesia with the patient or authorized representative who has indicated his/her understanding and acceptance.     Dental advisory  given  Plan Discussed with: CRNA  Anesthesia Plan Comments:          Anesthesia Quick Evaluation

## 2022-12-09 NOTE — Discharge Instructions (Signed)
Post procedure care instructions No driving for 4 days. No lifting over 5 lbs for 1 week. No vigorous or sexual activity for 1 week. You may return to work/your usual activities on 12/17/22. Keep procedure site clean & dry. If you notice increased pain, swelling, bleeding or pus, call/return!  You may shower after 24 hours, but no soaking in baths/hot tubs/pools for 1 week.   You have an appointment set up with the [facility].  Multiple studies have shown that being followed by a dedicated atrial fibrillation clinic in addition to the standard care you receive from your other physicians improves health. We believe that enrollment in the atrial fibrillation clinic will allow [place] to better care for you.   The phone number to the [facility] is [phone]. The clinic is staffed Monday through Friday from 8:30am to 5pm.  Directions: The clinic is located in the [facility], [place] the hospital at the MAIN ENTRANCE "A", use [address] to the 6th floor.  Registration desk to the right of elevators on 6th floor  If you have any trouble locating the clinic, please don't hesitate to call [phone].

## 2022-12-09 NOTE — H&P (Signed)
Electrophysiology Office Note:    Date:  12/09/2022   ID:  Richard Irwin, Richard Irwin 08/05/1946, MRN [id number]  PCP:  Lawerance Cruel, [place] Prov[facility] Cardiologist:  [name], MD Electrophysiologist:  Melida Quitter, MD     Referring MD: No ref. provider found   History of Present Illness:    Richard Irwin is a 77 y.o. male with a hx of CHFrEF (EF 35-40%), HTN, PAF and other issues listed below referred to EP clinic for management of persistent AF.  He has had an episode for syncope in 2021 in the setting of alcohol withdrawal.  During the admission he had an episode of NSVT. EF was normal at that time. He quit drinking and remains abstinent.   He saw Dr. Percival Spanish in September of this year after AF was detected incidentally during a routine visit to the [place]. Repeat TTE showed an EF drop to 35-40%. Eliquis and betablocker were started. We was not particularly symptomatic. DCCV was performed, but AF recurred prior to his next follow-up.  He reports that he feels well today - no chest pain, syncope, near-syncope, change in baseline shortness of breath with exertion.   I reviewed the patient's CT and labs. There was no LAA thrombus. he  has not missed any doses of anticoagulation, and he took his dose yesterday. There have been no changes in the patient's diagnoses, medications, or condition since our recent clinic visit. CT showed persistent filling defect in a PA branch which appears chronic.   Past Medical History:  Diagnosis Date   Acute blood loss anemia    Anemia    Diverticulosis    Emphysema/COPD ([place])    Esophageal varices (HCC)    H/O: GI bleed    Hypertension    Insomnia    Status post colonoscopy with polypectomy    with bleed   Thoracic ascending aortic aneurysm ([place]) 05/07/2013   Tubular adenoma     Past Surgical History:  Procedure Laterality Date   APPENDECTOMY     CARDIOVERSION N/A 07/07/2022   Procedure: CARDIOVERSION;   Surgeon: Jerline Pain, MD;  Location: [facility];  Service: Cardiovascular;  Laterality: N/A;   COLONOSCOPY WITH PROPOFOL N/A 07/09/2014   Procedure: COLONOSCOPY WITH PROPOFOL;  Surgeon: Irene Shipper, MD;  [facility];  Service: Endoscopy;  Laterality: N/A;   EXCISIONAL HEMORRHOIDECTOMY     HERNIA REPAIR     ROTATOR CUFF REPAIR      Current Medications: Current Meds  Medication Sig   alendronate (FOSAMAX) 70 MG tablet Take 70 mg by mouth every Saturday.   Cholecalciferol (VITAMIN D) 125 MCG (5000 UT) CAPS Take 5,000 Units by mouth daily.   magnesium oxide (MAG-OX) 400 (240 Mg) MG tablet Take 400 mg by mouth daily.   metoprolol succinate (TOPROL-XL) 50 MG 24 hr tablet Take 1 tablet (50 mg total) by mouth daily.   rivaroxaban (XARELTO) 20 MG TABS tablet Take 1 tablet (20 mg total) by mouth daily with supper.   rosuvastatin (CRESTOR) 20 MG tablet Take 1 tablet (20 mg total) by mouth daily.   sacubitril-valsartan (ENTRESTO) 49-51 MG Take 0.5 tablets by mouth 2 (two) times daily.   [DISCONTINUED] metoprolol succinate (TOPROL-XL) 50 MG 24 hr tablet Take 50 mg by mouth daily.     Allergies:   Lisinopril   Social History   Socioeconomic History   Marital status: Married    Spouse name: Not on file   Number  of children: 1   Years of education: Not on file   Highest education level: Not on file  Occupational History   Occupation: retired Press photographer  Tobacco Use   Smoking status: Never   Smokeless tobacco: Never  Vaping Use   Vaping Use: Never used  Substance and Sexual Activity   Alcohol use: Not Currently    Comment: beer 2-4 daily   Drug use: No   Sexual activity: Yes  Other Topics Concern   Not on file  Social History Narrative   Not on file   Social Determinants of Health   Financial Resource Strain: Not on file  Food Insecurity: Not on file  Transportation Needs: Not on file  Physical Activity: Not on file  Stress: Not on file  Social Connections: Not on  file     Family History: The patient's family history includes Cirrhosis in his father; Lung cancer in his mother. There is no history of Colon cancer, Pancreatic cancer, Rectal cancer, or Stomach cancer.  ROS:   Please see the history of present illness.    All other systems reviewed and are negative.  EKGs/Labs/Other Studies Reviewed Today:     3 day Ziopatch AF: HR 47-171, AVG 91  Rare PVCs, sometimes occurring in bigeminy  TTE 06/20/2022 EF 35-40%, mildly dilated LA  EKG:  Last EKG results: today - Atrial fibrillation with RVR, PVCs. Incomplete right bundle branch block   Recent Labs: 06/06/2022: TSH 1.830 11/21/2022: BUN 9; Creatinine, Ser 0.97; Hemoglobin 15.2; Platelets 115; Potassium 4.4; Sodium 139     Physical Exam:    VS:  BP (!) 127/90   Pulse 90   Temp (!) 97 F (36.1 C) (Temporal)   Resp 16   Ht 5\' 11"  (1.803 m)   Wt 77.1 kg   SpO2 98%   BMI 23.71 kg/m     Wt Readings from Last 3 Encounters:  12/09/22 77.1 kg  11/08/22 76.2 kg  10/27/22 77.2 kg     GEN: Well nourished, well developed in no acute distress CARDIAC: Irregular rhythm, no murmurs, rubs, gallops RESPIRATORY:  Normal work of breathing MUSCULOSKELETAL: no edema    ASSESSMENT & PLAN:    Atrial fibrillation: persistent, likely the cause of CHFrEF. NYHA is I-II. I think rhythm control is indicated to prevent progression of CHF. We discussed the indication, rationale, logistics, anticipated benefits, and potential risks of the ablation procedure including but not limited to -- bleed at the groin access site, chest pain, damage to nearby organs such as the diaphragm, lungs, or esophagus, need for a drainage tube, or prolonged hospitalization. I explained that the risk for stroke, heart attack, need for open chest surgery, or even death is very low but not zero. he  expressed understanding and wishes to proceed.       - continue xarelto PE: s/p course of lovenox, now on xarelto. Venous dopplers  were negative CHFrEF: on metoprol succinate 25, entresto 49-51. Pt appears well-compensated today.      Medication Adjustments/Labs and Tests Ordered: Current medicines are reviewed at length with the patient today.  Concerns regarding medicines are outlined above.  Orders Placed This Encounter  Procedures   Informed Consent Details: Physician/Practitioner Attestation; Transcribe to consent form and obtain patient signature   Initiate Pre-op Protocol   [facility] on call to EP Lab   Confirm CBC and BMP (or CMP) results within 7 days for inpatient and 30 days for outpatient:   Clip right and left femoral area  PM before surgery   Clip right internal jugular area PM before surgery   Pre-admission testing diagnosis   EP STUDY   Insert peripheral IV   Meds ordered this encounter  Medications   acetaminophen (TYLENOL) tablet 1,000 mg   acetaminophen (TYLENOL) 500 MG tablet    Benita Gutter A: cabinet override   0.9 %  sodium chloride infusion     Signed, Melida Quitter, MD  12/09/2022 10:40 AM    [place]

## 2022-12-12 ENCOUNTER — Encounter (HOSPITAL_COMMUNITY): Payer: Self-pay | Admitting: Cardiovascular Disease

## 2022-12-12 LAB — POCT ACTIVATED CLOTTING TIME
Activated Clotting Time: 325 seconds
Activated Clotting Time: 358 seconds
Activated Clotting Time: 304 seconds
Activated Clotting Time: 309 seconds

## 2023-01-05 ENCOUNTER — Ambulatory Visit: Payer: Medicare Other | Admitting: Cardiology

## 2023-01-06 ENCOUNTER — Encounter (HOSPITAL_COMMUNITY): Payer: Self-pay | Admitting: Physician Assistant

## 2023-01-06 ENCOUNTER — Other Ambulatory Visit (HOSPITAL_COMMUNITY): Payer: Self-pay | Admitting: Physician Assistant

## 2023-01-06 ENCOUNTER — Ambulatory Visit (HOSPITAL_COMMUNITY)
Admission: RE | Admit: 2023-01-06 | Discharge: 2023-01-06 | Disposition: A | Discharge status: Home / Self Care | Payer: Medicare Other | Source: Ambulatory Visit | Attending: Physician Assistant | Admitting: Physician Assistant

## 2023-01-06 VITALS — BP 116/84 | HR 113 | Ht 71.0 in | Wt 167.6 lb

## 2023-01-06 DIAGNOSIS — I5022 Chronic systolic (congestive) heart failure: Secondary | ICD-10-CM | POA: Diagnosis not present

## 2023-01-06 DIAGNOSIS — D6869 Other thrombophilia: Secondary | ICD-10-CM

## 2023-01-06 DIAGNOSIS — I4819 Other persistent atrial fibrillation: Secondary | ICD-10-CM

## 2023-01-06 DIAGNOSIS — Z7901 Long term (current) use of anticoagulants: Secondary | ICD-10-CM | POA: Diagnosis not present

## 2023-01-06 DIAGNOSIS — J449 Chronic obstructive pulmonary disease, unspecified: Secondary | ICD-10-CM | POA: Diagnosis not present

## 2023-01-06 DIAGNOSIS — I2510 Atherosclerotic heart disease of native coronary artery without angina pectoris: Secondary | ICD-10-CM | POA: Insufficient documentation

## 2023-01-06 DIAGNOSIS — Z79899 Other long term (current) drug therapy: Secondary | ICD-10-CM | POA: Diagnosis not present

## 2023-01-06 DIAGNOSIS — I110 Hypertensive heart disease with heart failure: Secondary | ICD-10-CM | POA: Insufficient documentation

## 2023-01-06 LAB — BASIC METABOLIC PANEL
Anion gap: 6 (ref 5–15)
BUN: 5 mg/dL — ABNORMAL LOW (ref 8–23)
CO2: 26 mmol/L (ref 22–32)
Calcium: 9.2 mg/dL (ref 8.9–10.3)
Chloride: 103 mmol/L (ref 98–111)
Creatinine, Ser: 0.88 mg/dL (ref 0.61–1.24)
GFR, Estimated: >60 mL/min (ref >60)
Glucose, Bld: 112 mg/dL — ABNORMAL HIGH (ref 70–99)
Potassium: 3.9 mmol/L (ref 3.5–5.1)
Sodium: 135 mmol/L (ref 135–145)

## 2023-01-06 LAB — CBC
HCT: 44.5 % (ref 39.0–52.0)
Hemoglobin: 14.5 g/dL (ref 13.0–17.0)
MCH: 31.7 pg (ref 26.0–34.0)
MCHC: 32.6 g/dL (ref 30.0–36.0)
MCV: 97.4 fL (ref 80.0–100.0)
Platelets: 117 10*3/uL — ABNORMAL LOW (ref 150–400)
RBC: 4.57 MIL/uL (ref 4.22–5.81)
RDW: 13.2 % (ref 11.5–15.5)
WBC: 5.5 10*3/uL (ref 4.0–10.5)
nRBC: 0 % (ref 0.0–0.2)

## 2023-01-06 NOTE — Patient Instructions (Addendum)
Cardioversion scheduled for:  Monday April 29th   - Arrive at the [facility] and go to admitting at 7:30am   - Do not eat or drink anything after midnight the night prior to your procedure.   - Take all your morning medication (except diabetic medications) with a sip of water prior to arrival.  - You will not be able to drive home after your procedure.    - Do NOT miss any doses of your blood thinner - if you should miss a dose please notify our office immediately.   - If you feel as if you go back into normal rhythm prior to scheduled cardioversion, please notify our office immediately.   If your procedure is canceled in the cardioversion suite you will be charged a cancellation fee.

## 2023-01-06 NOTE — Progress Notes (Signed)
Primary Care Physician: Daisy Floro, MD Primary Cardiologist: Dr Antoine Poche Primary Electrophysiologist: Dr Nelly Laurence Referring Physician: Dr Tana Felts is a 77 y.o. male with a history of chronic HFrEF, HTN, CAD, PE, esophageal varices, COPD, thoracic aortic aneurysm, atrial fibrillation who presents for consultation in the [facility]ibr[facility]. He has had an episode for syncope in 2021 in the setting of alcohol withdrawal.  During the admission he had an episode of NSVT. EF was normal at that time. He quit drinking and remains abstinent. He saw Dr. Antoine Poche in September of this year after AF was detected incidentally during a routine visit to the [place]. Repeat TTE showed an EF drop to 35-40%. Eliquis and betablocker were started. We was not particularly symptomatic. DCCV was performed, but AF recurred prior to his next follow-up. Patient is on Xarelto for a CHADS2VASC score of 5. He is s/p afib ablation with Dr Nelly Laurence on 12/09/22.   On follow up today, patient reports that he felt well for a few days post ablation with more energy but then started to feel like he was back in afib. ECG today does show he is back in afib. He denies chest pain, swallowing pain, or groin issues. No bleeding issues on anticoagulation.   Today, he denies symptoms of palpitations, chest pain, shortness of breath, orthopnea, PND, lower extremity edema, dizziness, presyncope, syncope, snoring, daytime somnolence, bleeding, or neurologic sequela. The patient is tolerating medications without difficulties and is otherwise without complaint today.    Atrial Fibrillation Risk Factors:  he does not have symptoms or diagnosis of sleep apnea. he does not have a history of rheumatic fever. he does have a history of alcohol use.   he has a BMI of Body mass index is 23.38 kg/m.Marland Kitchen Filed Weights   01/06/23 1126  Weight: 76 kg    Family History  Problem Relation Age of Onset   Cirrhosis  Father    Lung cancer Mother    Colon cancer Neg Hx    Pancreatic cancer Neg Hx    Rectal cancer Neg Hx    Stomach cancer Neg Hx      Atrial Fibrillation Management history:  Previous antiarrhythmic drugs: none Previous cardioversions: 07/07/22 Previous ablations: 12/09/22 Anticoagulation history: Eliquis   Past Medical History:  Diagnosis Date   Acute blood loss anemia    Anemia    Diverticulosis    Emphysema/COPD    Esophageal varices    H/O: GI bleed    Hypertension    Insomnia    Status post colonoscopy with polypectomy    with bleed   Thoracic ascending aortic aneurysm 05/07/2013   Tubular adenoma    Past Surgical History:  Procedure Laterality Date   APPENDECTOMY     ATRIAL FIBRILLATION ABLATION N/A 12/09/2022   Procedure: ATRIAL FIBRILLATION ABLATION;  Surgeon: Maurice Small, MD;  Location: [facility] INVASIVE CV LAB;  Service: Cardiovascular;  Laterality: N/A;   CARDIOVERSION N/A 07/07/2022   Procedure: CARDIOVERSION;  Surgeon: Jake Bathe, MD;  Location: [facility] ENDOSCOPY;  Service: Cardiovascular;  Laterality: N/A;   COLONOSCOPY WITH PROPOFOL N/A 07/09/2014   Procedure: COLONOSCOPY WITH PROPOFOL;  Surgeon: Hilarie Fredrickson, MD;  Location: [facility] ENDOSCOPY;  Service: Endoscopy;  Laterality: N/A;   EXCISIONAL HEMORRHOIDECTOMY     HERNIA REPAIR     ROTATOR CUFF REPAIR      Current Outpatient Medications  Medication Sig Dispense Refill   alendronate (FOSAMAX) 70 MG tablet Take  70 mg by mouth every Saturday.     Cholecalciferol (VITAMIN D) 125 MCG (5000 UT) CAPS Take 5,000 Units by mouth daily.     magnesium oxide (MAG-OX) 400 (240 Mg) MG tablet Take 400 mg by mouth daily.     metoprolol succinate (TOPROL-XL) 50 MG 24 hr tablet Take 1 tablet (50 mg total) by mouth daily. 90 tablet 3   pantoprazole (PROTONIX) 40 MG tablet Take 1 tablet (40 mg total) by mouth daily. 30 tablet 0   rivaroxaban (XARELTO) 20 MG TABS tablet Take 1 tablet (20 mg total) by mouth daily with supper. 30  tablet 0   rosuvastatin (CRESTOR) 20 MG tablet Take 1 tablet (20 mg total) by mouth daily. 90 tablet 3   sacubitril-valsartan (ENTRESTO) 49-51 MG Take 0.5 tablets by mouth 2 (two) times daily.     colchicine 0.6 MG tablet Take 1 tablet (0.6 mg total) by mouth 2 (two) times daily for 5 days. 10 tablet 0   No current facility-administered medications for this encounter.    Allergies  Allergen Reactions   Lisinopril Cough    Social History   Socioeconomic History   Marital status: Married    Spouse name: Not on file   Number of children: 1   Years of education: Not on file   Highest education level: Not on file  Occupational History   Occupation: retired Airline pilot  Tobacco Use   Smoking status: Never   Smokeless tobacco: Never   Tobacco comments:    Never smoke 01/06/23  Vaping Use   Vaping Use: Never used  Substance and Sexual Activity   Alcohol use: Not Currently    Comment: beer 2-4 daily   Drug use: No   Sexual activity: Yes  Other Topics Concern   Not on file  Social History Narrative   Not on file   Social Determinants of Health   Financial Resource Strain: Not on file  Food Insecurity: Not on file  Transportation Needs: Not on file  Physical Activity: Not on file  Stress: Not on file  Social Connections: Not on file  Intimate Partner Violence: Not on file     ROS- All systems are reviewed and negative except as per the HPI above.  Physical Exam: Vitals:   01/06/23 1126  BP: 116/84  Pulse: (!) 113  Weight: 76 kg  Height:  (1.803 m)    GEN- The patient is a well appearing male, alert and oriented x 3 today.   Head- normocephalic, atraumatic Eyes-  Sclera clear, conjunctiva pink Ears- hearing intact Oropharynx- clear Neck- supple  Lungs- Clear to ausculation bilaterally, normal work of breathing Heart- irregular rate and rhythm, no murmurs, rubs or gallops  GI- soft, NT, ND, + BS Extremities- no clubbing, cyanosis, or edema MS- no  significant deformity or atrophy Skin- no rash or lesion Psych- euthymic mood, full affect Neuro- strength and sensation are intact  Wt Readings from Last 3 Encounters:  01/06/23 76 kg  12/09/22 77.1 kg  11/08/22 76.2 kg    EKG today demonstrates  Afib, PVC Vent. rate 113 BPM PR interval * ms QRS duration 116 ms QT/QTcB 350/480 ms  Echo 06/20/22 demonstrated  1. Global hypokineiss abnormal septal motion EF has decreased since TTE  done 10/24/19. Left ventricular ejection fraction, by estimation, is 35 to  40%. The left ventricle has moderately decreased function. The left  ventricle demonstrates global hypokinesis. The left ventricular internal cavity size was mildly dilated. Left ventricular diastolic  parameters are indeterminate.   2. Right ventricular systolic function is normal. The right ventricular  size is normal.   3. Left atrial size was mildly dilated.   4. The mitral valve is abnormal. Mild mitral valve regurgitation. No  evidence of mitral stenosis.   5. The aortic valve is tricuspid. There is mild calcification of the  aortic valve. There is mild thickening of the aortic valve. Aortic valve  regurgitation is mild. Aortic valve sclerosis is present, with no evidence  of aortic valve stenosis.   6. Consider gated chest CTA to assess full aorta if not already done.  Aortic dilatation noted. There is severe dilatation of the aortic root,  measuring 48 mm. There is severe dilatation of the ascending aorta,  measuring 46 mm.   7. The inferior vena cava is normal in size with greater than 50%  respiratory variability, suggesting right atrial pressure of 3 mmHg.   Epic records are reviewed at length today.  CHA2DS2-VASc Score = 5  The patient's score is based upon: CHF History: 1 HTN History: 1 Diabetes History: 0 Stroke History: 0 Vascular Disease History: 1 Age Score: 2 Gender Score: 0       ASSESSMENT AND PLAN: 1. Persistent Atrial Fibrillation (ICD10:   I48.19) The patient's CHA2DS2-VASc score is 5, indicating a 7.2% annual risk of stroke.   S/p afib ablation 12/09/22 Patient appears to be back in persistent afib. We discussed rhythm control options today, will plan for DCCV. We also discussed that it is not unusual to have recurrence of afib in the first 3 months post ablation.  Continue Xarelto 20 mg daily with no missed doses for 3 months post ablation.  Continue Toprol 50 mg daily  2. Secondary Hypercoagulable State (ICD10:  D68.69) The patient is at significant risk for stroke/thromboembolism based upon his CHA2DS2-VASc Score of 5.  Continue Rivaroxaban (Xarelto).   3. Chronic HFrEF EF 35-40%, suspected related to afib. Fluid status appears stable today.   4. CAD CAC score 8393 on CT On statin No anginal symptoms.  5. HTN Stable, no changes today.   Follow up in the AF clinic post DCCV.    Jorja Loa PA-C Afib Clinic [facility] [address] [place], [place] [address] [phone] 01/06/2023 11:42 AM

## 2023-01-08 NOTE — H&P (View-Only) (Signed)
Cardiology Office Note:   Date:  01/09/2023  ID:  Richard Irwin, DOB 06-22-1946, MRN [id number]  History of Present Illness:   Richard Irwin is a 77 y.o. male with a history of chronic HFrEF, HTN, CAD, PE, esophageal varices, COPD, thoracic aortic aneurysm, atrial fibrillation who presents for follow up.  He has had an episode for syncope in 2021 in the setting of alcohol withdrawal.  During the admission he had an episode of NSVT. EF was normal at that time. He quit drinking and remains abstinent. He was found to have AF detected incidentally during a routine visit to the [place]. Repeat TTE showed an EF drop to 35-40%. Eliquis and betablocker were started. We was not particularly symptomatic. DCCV was performed, but AF recurred prior to his next follow-up. Patient is on Xarelto for a CHADS2VASC score of 5. He is s/p afib ablation with Dr Nelly Laurence on 12/09/22.   Since last seen he has been back in atrial for and there is plan for cardioversion later this month.  He just feels fatigued with that.  He still tries to do a little walking but he does not have much energy with that.  He denies any presyncope or syncope.  He is not having any chest pressure, neck or arm discomfort.  He is having no weight gain or edema.  ROS: Insomnia.  Otherwise as stated in the HPI and negative for all other systems.  Studies Reviewed:    EKG: Atrial fibrillation, rate 101, borderline interventricular conduction delay, leftward axis, premature ventricular contractions.   Risk Assessment/Calculations:    CHA2DS2-VASc Score = 5  1} This indicates a 7.2% annual risk of stroke. The patient's score is based upon: CHF History: 1 HTN History: 1 Diabetes History: 0 Stroke History: 0 Vascular Disease History: 1 Age Score: 2 Gender Score: 0           Physical Exam:   VS:  BP 122/76 (BP Location: Left Arm, Patient Position: Sitting, Cuff Size: Normal)   Pulse (!) 101   Ht  (1.803 m)   Wt 165 lb 12.8 oz  (75.2 kg)   SpO2 98%   BMI 23.12 kg/m    Wt Readings from Last 3 Encounters:  01/09/23 165 lb 12.8 oz (75.2 kg)  01/06/23 167 lb 9.6 oz (76 kg)  12/09/22 170 lb (77.1 kg)     GEN: Well nourished, well developed in no acute distress NECK: No JVD; No carotid bruits CARDIAC: Irregular RR, no murmurs, rubs, gallops RESPIRATORY:  Clear to auscultation without rales, wheezing or rhonchi  ABDOMEN: Soft, non-tender, non-distended EXTREMITIES:  No edema; No deformity   ASSESSMENT AND PLAN:   Persistent Atrial Fibrillation (ICD10:  I48.19): He is for cardioversion later this month.  He will continue his meds as listed.  His rate is little bit elevated and I am going to increase his metoprolol to 75 mg daily.  Secondary Hypercoagulable State (ICD10:  D68.69): He is up-to-date with blood work.  He is on the appropriate dose of anticoagulant.  Chronic HFrEF:  EF was 35 - 40% in October 2023.  Blood pressures been slightly low.  I am going to titrate meds as listed.  He will continue the other meds.  Eventually we he is in sinus I will reassess an ejection fraction..    CAD: He has no symptoms.  His score was 8393.  I might eventually do treadmill testing with his rhythm staying here but he is not having any  angina.  I am going to pursue aggressive risk reduction.  HTN:  This is being managed in the context of treating his CHF   Dyslipidemia: The goal is an LDL in the 50s.  He was started on Crestor beginning of February.  When he comes back next month to the Atrial Fibrillation Clinic I will order LP(a) and LDL     Signed, Rollene Rotunda, MD

## 2023-01-08 NOTE — Progress Notes (Unsigned)
Cardiology Office Note:   Date:  01/09/2023  ID:  Richard Irwin, DOB 06-22-1946, MRN [id number]  History of Present Illness:   Richard Irwin is a 77 y.o. male with a history of chronic HFrEF, HTN, CAD, PE, esophageal varices, COPD, thoracic aortic aneurysm, atrial fibrillation who presents for follow up.  He has had an episode for syncope in 2021 in the setting of alcohol withdrawal.  During the admission he had an episode of NSVT. EF was normal at that time. He quit drinking and remains abstinent. He was found to have AF detected incidentally during a routine visit to the [place]. Repeat TTE showed an EF drop to 35-40%. Eliquis and betablocker were started. We was not particularly symptomatic. DCCV was performed, but AF recurred prior to his next follow-up. Patient is on Xarelto for a CHADS2VASC score of 5. He is s/p afib ablation with Dr Nelly Laurence on 12/09/22.   Since last seen he has been back in atrial for and there is plan for cardioversion later this month.  He just feels fatigued with that.  He still tries to do a little walking but he does not have much energy with that.  He denies any presyncope or syncope.  He is not having any chest pressure, neck or arm discomfort.  He is having no weight gain or edema.  ROS: Insomnia.  Otherwise as stated in the HPI and negative for all other systems.  Studies Reviewed:    EKG: Atrial fibrillation, rate 101, borderline interventricular conduction delay, leftward axis, premature ventricular contractions.   Risk Assessment/Calculations:    CHA2DS2-VASc Score = 5  1} This indicates a 7.2% annual risk of stroke. The patient's score is based upon: CHF History: 1 HTN History: 1 Diabetes History: 0 Stroke History: 0 Vascular Disease History: 1 Age Score: 2 Gender Score: 0           Physical Exam:   VS:  BP 122/76 (BP Location: Left Arm, Patient Position: Sitting, Cuff Size: Normal)   Pulse (!) 101   Ht  (1.803 m)   Wt 165 lb 12.8 oz  (75.2 kg)   SpO2 98%   BMI 23.12 kg/m    Wt Readings from Last 3 Encounters:  01/09/23 165 lb 12.8 oz (75.2 kg)  01/06/23 167 lb 9.6 oz (76 kg)  12/09/22 170 lb (77.1 kg)     GEN: Well nourished, well developed in no acute distress NECK: No JVD; No carotid bruits CARDIAC: Irregular RR, no murmurs, rubs, gallops RESPIRATORY:  Clear to auscultation without rales, wheezing or rhonchi  ABDOMEN: Soft, non-tender, non-distended EXTREMITIES:  No edema; No deformity   ASSESSMENT AND PLAN:   Persistent Atrial Fibrillation (ICD10:  I48.19): He is for cardioversion later this month.  He will continue his meds as listed.  His rate is little bit elevated and I am going to increase his metoprolol to 75 mg daily.  Secondary Hypercoagulable State (ICD10:  D68.69): He is up-to-date with blood work.  He is on the appropriate dose of anticoagulant.  Chronic HFrEF:  EF was 35 - 40% in October 2023.  Blood pressures been slightly low.  I am going to titrate meds as listed.  He will continue the other meds.  Eventually we he is in sinus I will reassess an ejection fraction..    CAD: He has no symptoms.  His score was 8393.  I might eventually do treadmill testing with his rhythm staying here but he is not having any  angina.  I am going to pursue aggressive risk reduction.  HTN:  This is being managed in the context of treating his CHF   Dyslipidemia: The goal is an LDL in the 50s.  He was started on Crestor beginning of February.  When he comes back next month to the Atrial Fibrillation Clinic I will order LP(a) and LDL     Signed, Rollene Rotunda, MD

## 2023-01-09 ENCOUNTER — Ambulatory Visit: Payer: Medicare Other | Attending: Cardiology | Admitting: Cardiology

## 2023-01-09 ENCOUNTER — Encounter: Payer: Self-pay | Admitting: Cardiology

## 2023-01-09 VITALS — BP 122/76 | HR 101 | Ht 71.0 in | Wt 165.8 lb

## 2023-01-09 DIAGNOSIS — D6869 Other thrombophilia: Secondary | ICD-10-CM

## 2023-01-09 DIAGNOSIS — I4819 Other persistent atrial fibrillation: Secondary | ICD-10-CM

## 2023-01-09 DIAGNOSIS — I2510 Atherosclerotic heart disease of native coronary artery without angina pectoris: Secondary | ICD-10-CM

## 2023-01-09 DIAGNOSIS — I5022 Chronic systolic (congestive) heart failure: Secondary | ICD-10-CM

## 2023-01-09 MED ORDER — METOPROLOL SUCCINATE ER 50 MG PO TB24: ORAL_TABLET | ORAL | 3 refills | Status: DC

## 2023-01-09 NOTE — Patient Instructions (Addendum)
Medication Instructions:  Your physician has recommended you make the following change in your medication:   -Add metoprolol succinate (Toprol-XL)  (1/2 tablet) in the evening.  *If you need a refill on your cardiac medications before your next appointment, please call your pharmacy*   Lab Work: Your physician recommends that you return for lab work in: May (A-fib clinic visit) FASTING Lipids & LPa  If you have labs (blood work) drawn today and your tests are completely normal, you will receive your results only by: MyChart Message (if you have MyChart) OR A paper copy in the mail If you have any lab test that is abnormal or we need to change your treatment, we will call you to review the results.   Follow-Up: At [facility], you and your health needs are our priority.  As part of our continuing mission to provide you with exceptional heart care, we have created designated Provider Care Teams.  These Care Teams include your primary Cardiologist (physician) and Advanced Practice Providers (APPs -  Physician Assistants and Nurse Practitioners) who all work together to provide you with the care you need, when you need it.  We recommend signing up for the patient portal called "MyChart".  Sign up information is provided on this After Visit Summary.  MyChart is used to connect with patients for Virtual Visits (Telemedicine).  Patients are able to view lab/test results, encounter notes, upcoming appointments, etc.  Non-urgent messages can be sent to your provider as well.   To learn more about what you can do with MyChart, go to [email or web address].    Your next appointment:   4 month(s)  Provider:   Rollene Rotunda, MD

## 2023-01-10 ENCOUNTER — Ambulatory Visit: Payer: Medicare Other | Admitting: Cardiovascular Disease

## 2023-01-13 NOTE — Pre-Procedure Instructions (Signed)
Left message for patient regarding instructions for cardioversion - arrive at 0630, NPO after midnight, ensure ride home and responsible person to stay with pt for 24 hours

## 2023-01-15 NOTE — Anesthesia Preprocedure Evaluation (Signed)
Anesthesia Evaluation  Patient identified by MRN, date of birth, ID band Patient awake    Reviewed: Allergy & Precautions, NPO status , Patient's Chart, lab work & pertinent test results, reviewed documented beta blocker date and time   Airway Mallampati: II  TM Distance: >3 FB Neck ROM: Full    Dental no notable dental hx. (+) Teeth Intact, Dental Advisory Given   Pulmonary COPD, PE   Pulmonary exam normal breath sounds clear to auscultation       Cardiovascular hypertension, Pt. on home beta blockers and Pt. on medications + CAD  Normal cardiovascular exam+ dysrhythmias (xarelto, no missed doses) Atrial Fibrillation  Rhythm:Irregular Rate:Normal  TTE 2023  1. Global hypokineiss abnormal septal motion EF has decreased since TTE  done 10/24/19. Left ventricular ejection fraction, by estimation, is 35 to  40%. The left ventricle has moderately decreased function. The left  ventricle demonstrates global  hypokinesis. The left ventricular internal cavity size was mildly dilated.  Left ventricular diastolic parameters are indeterminate.   2. Right ventricular systolic function is normal. The right ventricular  size is normal.   3. Left atrial size was mildly dilated.   4. The mitral valve is abnormal. Mild mitral valve regurgitation. No  evidence of mitral stenosis.   5. The aortic valve is tricuspid. There is mild calcification of the  aortic valve. There is mild thickening of the aortic valve. Aortic valve  regurgitation is mild. Aortic valve sclerosis is present, with no evidence  of aortic valve stenosis.   6. Consider gated chest CTA to assess full aorta if not already done.  Aortic dilatation noted. There is severe dilatation of the aortic root,  measuring 48 mm. There is severe dilatation of the ascending aorta,  measuring 46 mm.   7. The inferior vena cava is normal in size with greater than 50%  respiratory variability,  suggesting right atrial pressure of 3 mmHg.   Moderate thoracic aortic aneurysm 46 mm    Neuro/Psych negative neurological ROS  negative psych ROS   GI/Hepatic negative GI ROS,,,(+) Cirrhosis   Esophageal Varices  substance abuse  alcohol use  Endo/Other  negative endocrine ROS    Renal/GU negative Renal ROS  negative genitourinary   Musculoskeletal negative musculoskeletal ROS (+)    Abdominal   Peds  Hematology negative hematology ROS (+)   Anesthesia Other Findings   Reproductive/Obstetrics                             Anesthesia Physical Anesthesia Plan  ASA: 3  Anesthesia Plan: General   Post-op Pain Management:    Induction: Intravenous  PONV Risk Score and Plan: 2 and Propofol infusion and Treatment may vary due to age or medical condition  Airway Management Planned: Natural Airway  Additional Equipment:   Intra-op Plan:   Post-operative Plan:   Informed Consent: I have reviewed the patients History and Physical, chart, labs and discussed the procedure including the risks, benefits and alternatives for the proposed anesthesia with the patient or authorized representative who has indicated his/her understanding and acceptance.     Dental advisory given  Plan Discussed with: CRNA  Anesthesia Plan Comments:        Anesthesia Quick Evaluation

## 2023-01-16 ENCOUNTER — Encounter (HOSPITAL_COMMUNITY)
Admission: RE | Disposition: A | Discharge status: Home / Self Care | Payer: Self-pay | Source: Home / Self Care | Attending: Cardiology

## 2023-01-16 ENCOUNTER — Other Ambulatory Visit: Payer: Self-pay

## 2023-01-16 ENCOUNTER — Ambulatory Visit (HOSPITAL_COMMUNITY)
Admission: RE | Admit: 2023-01-16 | Discharge: 2023-01-16 | Disposition: A | Discharge status: Home / Self Care | Payer: Medicare Other | Attending: Cardiology | Admitting: Cardiology

## 2023-01-16 ENCOUNTER — Encounter (HOSPITAL_COMMUNITY): Payer: Self-pay | Admitting: Cardiology

## 2023-01-16 ENCOUNTER — Ambulatory Visit (HOSPITAL_COMMUNITY): Payer: Medicare Other | Admitting: Anesthesiology

## 2023-01-16 ENCOUNTER — Ambulatory Visit (HOSPITAL_BASED_OUTPATIENT_CLINIC_OR_DEPARTMENT_OTHER): Payer: Medicare Other | Admitting: Anesthesiology

## 2023-01-16 DIAGNOSIS — D6869 Other thrombophilia: Secondary | ICD-10-CM | POA: Insufficient documentation

## 2023-01-16 DIAGNOSIS — I2510 Atherosclerotic heart disease of native coronary artery without angina pectoris: Secondary | ICD-10-CM

## 2023-01-16 DIAGNOSIS — I4891 Unspecified atrial fibrillation: Secondary | ICD-10-CM | POA: Diagnosis not present

## 2023-01-16 DIAGNOSIS — Z79899 Other long term (current) drug therapy: Secondary | ICD-10-CM | POA: Insufficient documentation

## 2023-01-16 DIAGNOSIS — Z7901 Long term (current) use of anticoagulants: Secondary | ICD-10-CM | POA: Insufficient documentation

## 2023-01-16 DIAGNOSIS — J449 Chronic obstructive pulmonary disease, unspecified: Secondary | ICD-10-CM | POA: Insufficient documentation

## 2023-01-16 DIAGNOSIS — I5022 Chronic systolic (congestive) heart failure: Secondary | ICD-10-CM | POA: Insufficient documentation

## 2023-01-16 DIAGNOSIS — I10 Essential (primary) hypertension: Secondary | ICD-10-CM

## 2023-01-16 DIAGNOSIS — I712 Thoracic aortic aneurysm, without rupture, unspecified: Secondary | ICD-10-CM | POA: Insufficient documentation

## 2023-01-16 DIAGNOSIS — I4819 Other persistent atrial fibrillation: Secondary | ICD-10-CM | POA: Diagnosis not present

## 2023-01-16 DIAGNOSIS — I110 Hypertensive heart disease with heart failure: Secondary | ICD-10-CM | POA: Diagnosis not present

## 2023-01-16 DIAGNOSIS — E785 Hyperlipidemia, unspecified: Secondary | ICD-10-CM | POA: Insufficient documentation

## 2023-01-16 HISTORY — PX: CARDIOVERSION: SHX1299

## 2023-01-16 LAB — LIPID PANEL
Cholesterol: 131 mg/dL (ref 0–200)
HDL: 56 mg/dL (ref >40)
LDL Cholesterol: 67 mg/dL (ref 0–99)
Total CHOL/HDL Ratio: 2.3 RATIO
Triglycerides: 40 mg/dL (ref <150)
VLDL: 8 mg/dL (ref 0–40)

## 2023-01-16 SURGERY — CARDIOVERSION
Anesthesia: General

## 2023-01-16 MED ORDER — PROPOFOL 10 MG/ML IV BOLUS
INTRAVENOUS | Status: DC | PRN
  Administered 2023-01-16: 40 mg via INTRAVENOUS
  Administered 2023-01-16: 50 mg via INTRAVENOUS

## 2023-01-16 MED ORDER — SODIUM CHLORIDE 0.9 % IV BOLUS
250.0000 mL | Freq: Once | INTRAVENOUS | Status: AC
  Administered 2023-01-16: 250 mL via INTRAVENOUS

## 2023-01-16 MED ORDER — LIDOCAINE 2% (20 MG/ML) 5 ML SYRINGE
INTRAMUSCULAR | Status: DC | PRN
  Administered 2023-01-16: 60 mg via INTRAVENOUS

## 2023-01-16 MED ORDER — SODIUM CHLORIDE 0.9 % IV SOLN: INTRAVENOUS | Status: DC

## 2023-01-16 SURGICAL SUPPLY — 1 items: ELECT DEFIB PAD ADLT CADENCE (PAD) ×2 IMPLANT

## 2023-01-16 NOTE — Discharge Instructions (Signed)

## 2023-01-16 NOTE — Anesthesia Procedure Notes (Signed)
Procedure Name: General with mask airway Date/Time: 01/16/2023 7:33 AM  Performed by: Jodell Cipro, CRNAOxygen Delivery Method: Ambu bag Dental Injury: Teeth and Oropharynx as per pre-operative assessment

## 2023-01-16 NOTE — CV Procedure (Signed)
Procedure: Electrical Cardioversion Indications:  Atrial Fibrillation  Procedure Details:  Consent: Risks of procedure as well as the alternatives and risks of each were explained to the (patient/caregiver).  Consent for procedure obtained.  Time Out: Verified patient identification, verified procedure, site/side was marked, verified correct patient position, special equipment/implants available, medications/allergies/relevent history reviewed, required imaging and test results available. PERFORMED.  Patient placed on cardiac monitor, pulse oximetry, supplemental oxygen as necessary.  Sedation given:  Propofol 90mg ; lidocaine 60mg  Pacer pads placed anterior and posterior chest.  Cardioverted 2 time(s).  Cardioversion with synchronized biphasic 150J, 200J shock.  Evaluation: Findings: Post procedure EKG shows:  NSR with frequent PACs and occasional PVCs Complications: None Patient did tolerate procedure well.  Time Spent Directly with the Patient:    Richard Irwin 01/16/2023, 8:10 AM

## 2023-01-16 NOTE — Anesthesia Postprocedure Evaluation (Signed)
Anesthesia Post Note  Patient: Richard Irwin  Procedure(s) Performed: CARDIOVERSION     Patient location during evaluation: PACU Anesthesia Type: General Level of consciousness: awake and alert Pain management: pain level controlled Vital Signs Assessment: post-procedure vital signs reviewed and stable Respiratory status: spontaneous breathing, nonlabored ventilation, respiratory function stable and patient connected to nasal cannula oxygen Cardiovascular status: blood pressure returned to baseline and stable Postop Assessment: no apparent nausea or vomiting Anesthetic complications: no  No notable events documented.  Last Vitals:  Vitals:   01/16/23 0820 01/16/23 0830  BP: (!) 95/54 108/62  Pulse: 71 (!) 59  Resp: 19 19  Temp:    SpO2: 96% 94%    Last Pain:  Vitals:   01/16/23 0830  TempSrc:   PainSc: 0-No pain                 Toshiyuki Fredell L Neilah Fulwider

## 2023-01-16 NOTE — Transfer of Care (Signed)
Immediate Anesthesia Transfer of Care Note  Patient: Richard Irwin  Procedure(s) Performed: CARDIOVERSION  Patient Location: Cath Lab  Anesthesia Type:General  Level of Consciousness: awake, alert , and oriented  Airway & Oxygen Therapy: Patient Spontanous Breathing and Patient connected to nasal cannula oxygen  Post-op Assessment: Report given to RN and Post -op Vital signs reviewed and stable  Post vital signs: Reviewed and stable  Last Vitals:  Vitals Value Taken Time  BP    Temp    Pulse    Resp    SpO2      Last Pain:  Vitals:   01/16/23 0645  TempSrc: Temporal  PainSc: 0-No pain         Complications: No notable events documented.

## 2023-01-16 NOTE — Interval H&P Note (Signed)
History and Physical Interval Note:  01/16/2023 7:40 AM  Richard Irwin  has presented today for surgery, with the diagnosis of AFIB.  The various methods of treatment have been discussed with the patient and family. After consideration of risks, benefits and other options for treatment, the patient has consented to  Procedure(s): CARDIOVERSION (N/A) as a surgical intervention.  The patient's history has been reviewed, patient examined, no change in status, stable for surgery.  I have reviewed the patient's chart and labs.  Questions were answered to the patient's satisfaction.     Meriam Sprague

## 2023-01-17 LAB — LIPOPROTEIN A (LPA): Lipoprotein (a): 49.8 nmol/L — ABNORMAL HIGH (ref <75.0)

## 2023-01-26 NOTE — Progress Notes (Signed)
Primary Care Physician: Daisy Floro, MD Primary Cardiologist: Dr Antoine Poche Primary Electrophysiologist: Dr Nelly Laurence Referring Physician: Dr Tana Felts is a 77 y.o. male with a history of chronic HFrEF, HTN, CAD, PE, esophageal varices, COPD, thoracic aortic aneurysm, atrial fibrillation who presents for follow up in the [facility]. He has had an episode for syncope in 2021 in the setting of alcohol withdrawal.  During the admission he had an episode of NSVT. EF was normal at that time. He quit drinking and remains abstinent. He saw Dr. Antoine Poche in September of this year after AF was detected incidentally during a routine visit to the [place]. Repeat TTE showed an EF drop to 35-40%. Eliquis and betablocker were started. We was not particularly symptomatic. DCCV was performed, but AF recurred prior to his next follow-up. Patient is on Xarelto for a CHADS2VASC score of 5. He is s/p afib ablation with Dr Nelly Laurence on 12/09/22.   Patient reports that he felt well for a few days post ablation with more energy but then started to feel like he was back in afib. ECG on 01/06/23 showed he was in afib again.  On follow up today, patient is s/p DCCV on 01/16/23. He remains in SR today. He still feels fatigued, minimally improved. No bleeding issues on anticoagulation. His primary concern is about his unintentional weight loss. He plans to discuss with his GI specialist.   Today, he denies symptoms of palpitations, chest pain, shortness of breath, orthopnea, PND, lower extremity edema, dizziness, presyncope, syncope, snoring, daytime somnolence, bleeding, or neurologic sequela. The patient is tolerating medications without difficulties and is otherwise without complaint today.    Atrial Fibrillation Risk Factors:  he does not have symptoms or diagnosis of sleep apnea. he does not have a history of rheumatic fever. he does have a history of alcohol use.   he has a  BMI of Body mass index is 22.76 kg/m.Marland Kitchen Filed Weights   01/27/23 0822  Weight: 74 kg   Family History  Problem Relation Age of Onset   Cirrhosis Father    Lung cancer Mother    Colon cancer Neg Hx    Pancreatic cancer Neg Hx    Rectal cancer Neg Hx    Stomach cancer Neg Hx      Atrial Fibrillation Management history:  Previous antiarrhythmic drugs: none Previous cardioversions: 07/07/22, 01/16/23 Previous ablations: 12/09/22 Anticoagulation history: Eliquis   Past Medical History:  Diagnosis Date   Acute blood loss anemia    Anemia    Diverticulosis    Emphysema/COPD (HCC)    Esophageal varices (HCC)    H/O: GI bleed    Hypertension    Insomnia    Status post colonoscopy with polypectomy    with bleed   Thoracic ascending aortic aneurysm (HCC) 05/07/2013   Tubular adenoma    Past Surgical History:  Procedure Laterality Date   APPENDECTOMY     ATRIAL FIBRILLATION ABLATION N/A 12/09/2022   Procedure: ATRIAL FIBRILLATION ABLATION;  Surgeon: Maurice Small, MD;  [facility];  Service: Cardiovascular;  Laterality: N/A;   CARDIOVERSION N/A 07/07/2022   Procedure: CARDIOVERSION;  Surgeon: Jake Bathe, MD;  Location: [facility] ENDOSCOPY;  Service: Cardiovascular;  Laterality: N/A;   CARDIOVERSION N/A 01/16/2023   Procedure: CARDIOVERSION;  Surgeon: Meriam Sprague, MD;  Location: [facility]VAS[facility] CV LAB;  Service: Cardiovascular;  Laterality: N/A;   COLONOSCOPY WITH PROPOFOL N/A 07/09/2014   Procedure:  COLONOSCOPY WITH PROPOFOL;  Surgeon: Hilarie Fredrickson, MD;  Location: [facility] ENDOSCOPY;  Service: Endoscopy;  Laterality: N/A;   EXCISIONAL HEMORRHOIDECTOMY     HERNIA REPAIR     ROTATOR CUFF REPAIR      Current Outpatient Medications  Medication Sig Dispense Refill   alendronate (FOSAMAX) 70 MG tablet Take 70 mg by mouth every Saturday.     Cholecalciferol (VITAMIN D) 125 MCG (5000 UT) CAPS Take 5,000 Units by mouth daily.     magnesium oxide (MAG-OX) 400 (240  Mg) MG tablet Take 400 mg by mouth daily.     metoprolol succinate (TOPROL-XL) 25 MG 24 hr tablet Take 25 mg by mouth See admin instructions. Take with 50 mg for a total of 75 mg daily     metoprolol succinate (TOPROL-XL) 50 MG 24 hr tablet Take 1 tablet (50 mg total) by mouth every morning AND 0.5 tablets (25 mg total) every evening.  . (Patient taking differently: Take in morning with 25 mg for a total of 75 mg daily) 135 tablet 3   rivaroxaban (XARELTO) 20 MG TABS tablet Take 1 tablet (20 mg total) by mouth daily with supper. 30 tablet 0   rosuvastatin (CRESTOR) 20 MG tablet Take 1 tablet (20 mg total) by mouth daily. 90 tablet 3   sacubitril-valsartan (ENTRESTO) 49-51 MG Take 0.5 tablets by mouth 2 (two) times daily.     triamcinolone cream (KENALOG) 0.1 % SMARTSIG:1 Application Topical 2-3 Times Daily     No current facility-administered medications for this encounter.    Allergies  Allergen Reactions   Lisinopril Cough    Social History   Socioeconomic History   Marital status: Married    Spouse name: Not on file   Number of children: 1   Years of education: Not on file   Highest education level: Not on file  Occupational History   Occupation: retired Airline pilot  Tobacco Use   Smoking status: Never   Smokeless tobacco: Never   Tobacco comments:    Never smoke 01/06/23  Vaping Use   Vaping Use: Never used  Substance and Sexual Activity   Alcohol use: Not Currently    Comment: beer 2-4 daily   Drug use: No   Sexual activity: Yes  Other Topics Concern   Not on file  Social History Narrative   Not on file   Social Determinants of Health   Financial Resource Strain: Not on file  Food Insecurity: Not on file  Transportation Needs: Not on file  Physical Activity: Not on file  Stress: Not on file  Social Connections: Not on file  Intimate Partner Violence: Not on file     ROS- All systems are reviewed and negative except as per the HPI above.  Physical Exam: Vitals:    01/27/23 0822  BP: 122/80  Pulse: (!) 54  Weight: 74 kg  Height: 5\' 11"  (1.803 m)     GEN- The patient is a well appearing elderly male, alert and oriented x 3 today.   HEENT-head normocephalic, atraumatic, sclera clear, conjunctiva pink, hearing intact, trachea midline. Lungs- Clear to ausculation bilaterally, normal work of breathing Heart- Regular rate and rhythm, no murmurs, rubs or gallops  GI- soft, NT, ND, + BS Extremities- no clubbing, cyanosis, or edema MS- no significant deformity or atrophy Skin- no rash or lesion Psych- euthymic mood, full affect Neuro- strength and sensation are intact   Wt Readings from Last 3 Encounters:  01/27/23 74 kg  01/16/23 74.8 kg  01/09/23 75.2 kg    EKG today demonstrates  SB Vent. rate 54 BPM PR interval 192 ms QRS duration 118 ms QT/QTcB 478/453 ms  Echo 06/20/22 demonstrated  1. Global hypokineiss abnormal septal motion EF has decreased since TTE  done 10/24/19. Left ventricular ejection fraction, by estimation, is 35 to  40%. The left ventricle has moderately decreased function. The left  ventricle demonstrates global hypokinesis. The left ventricular internal cavity size was mildly dilated. Left ventricular diastolic parameters are indeterminate.   2. Right ventricular systolic function is normal. The right ventricular  size is normal.   3. Left atrial size was mildly dilated.   4. The mitral valve is abnormal. Mild mitral valve regurgitation. No  evidence of mitral stenosis.   5. The aortic valve is tricuspid. There is mild calcification of the  aortic valve. There is mild thickening of the aortic valve. Aortic valve  regurgitation is mild. Aortic valve sclerosis is present, with no evidence  of aortic valve stenosis.   6. Consider gated chest CTA to assess full aorta if not already done.  Aortic dilatation noted. There is severe dilatation of the aortic root,  measuring 48 mm. There is severe dilatation of the ascending  aorta,  measuring 46 mm.   7. The inferior vena cava is normal in size with greater than 50%  respiratory variability, suggesting right atrial pressure of 3 mmHg.   Epic records are reviewed at length today.  CHA2DS2-VASc Score = 5  The patient's score is based upon: CHF History: 1 HTN History: 1 Diabetes History: 0 Stroke History: 0 Vascular Disease History: 1 Age Score: 2 Gender Score: 0       ASSESSMENT AND PLAN: 1. Persistent Atrial Fibrillation (ICD10:  I48.19) The patient's CHA2DS2-VASc score is 5, indicating a 7.2% annual risk of stroke.   S/p afib ablation 12/09/22 S/p DCCV 01/16/23 Patient back in SR.  Continue Xarelto 20 mg daily with no missed doses for 3 months post ablation.  Continue Toprol 75 mg daily  2. Secondary Hypercoagulable State (ICD10:  D68.69) The patient is at significant risk for stroke/thromboembolism based upon his CHA2DS2-VASc Score of 5.  Continue Rivaroxaban (Xarelto).   3. Chronic HFrEF EF 35-40%, suspected related to afib. Fluid status appears stable.  Hopefully will improve with SR.  4. CAD CAC score 8393 on CT On statin No anginal symptoms.  5. HTN Stable, no changes today.   Follow up with Dr Nelly Laurence as scheduled.    Jorja Loa PA-C [facility] [address] [place], [place] [address] [phone] 01/27/2023 8:38 AM

## 2023-01-27 ENCOUNTER — Ambulatory Visit (HOSPITAL_COMMUNITY)
Admission: RE | Admit: 2023-01-27 | Discharge: 2023-01-27 | Disposition: A | Discharge status: Home / Self Care | Payer: Medicare Other | Source: Ambulatory Visit | Attending: Physician Assistant | Admitting: Physician Assistant

## 2023-01-27 VITALS — BP 122/80 | HR 54 | Ht 71.0 in | Wt 163.2 lb

## 2023-01-27 DIAGNOSIS — I5022 Chronic systolic (congestive) heart failure: Secondary | ICD-10-CM | POA: Diagnosis not present

## 2023-01-27 DIAGNOSIS — I2510 Atherosclerotic heart disease of native coronary artery without angina pectoris: Secondary | ICD-10-CM | POA: Diagnosis not present

## 2023-01-27 DIAGNOSIS — Z86711 Personal history of pulmonary embolism: Secondary | ICD-10-CM | POA: Diagnosis not present

## 2023-01-27 DIAGNOSIS — I8500 Esophageal varices without bleeding: Secondary | ICD-10-CM | POA: Diagnosis not present

## 2023-01-27 DIAGNOSIS — J449 Chronic obstructive pulmonary disease, unspecified: Secondary | ICD-10-CM | POA: Insufficient documentation

## 2023-01-27 DIAGNOSIS — I4819 Other persistent atrial fibrillation: Secondary | ICD-10-CM | POA: Diagnosis not present

## 2023-01-27 DIAGNOSIS — I110 Hypertensive heart disease with heart failure: Secondary | ICD-10-CM | POA: Insufficient documentation

## 2023-01-27 DIAGNOSIS — D6869 Other thrombophilia: Secondary | ICD-10-CM | POA: Insufficient documentation

## 2023-01-27 DIAGNOSIS — I712 Thoracic aortic aneurysm, without rupture, unspecified: Secondary | ICD-10-CM | POA: Diagnosis not present

## 2023-01-30 ENCOUNTER — Ambulatory Visit (HOSPITAL_COMMUNITY): Payer: Medicare Other | Admitting: Physician Assistant

## 2023-03-06 ENCOUNTER — Ambulatory Visit: Payer: Medicare Other | Admitting: Cardiology

## 2023-03-10 ENCOUNTER — Ambulatory Visit: Payer: Medicare Other | Attending: Cardiovascular Disease | Admitting: Cardiovascular Disease

## 2023-03-10 ENCOUNTER — Encounter: Payer: Self-pay | Admitting: Cardiovascular Disease

## 2023-03-10 ENCOUNTER — Ambulatory Visit (INDEPENDENT_AMBULATORY_CARE_PROVIDER_SITE_OTHER): Payer: Medicare Other

## 2023-03-10 VITALS — BP 116/68 | HR 67 | Ht 71.0 in | Wt 160.0 lb

## 2023-03-10 DIAGNOSIS — I4819 Other persistent atrial fibrillation: Secondary | ICD-10-CM

## 2023-03-10 NOTE — Addendum Note (Signed)
Addended by: Sherle Poe R on: 03/10/2023 11:25 AM   Modules accepted: Orders

## 2023-03-10 NOTE — Progress Notes (Unsigned)
Enrolled for [employer] to mail a ZIO XT long term holter monitor to the patients address on file.  

## 2023-03-10 NOTE — Patient Instructions (Signed)
Medication Instructions:  Your physician recommends that you continue on your current medications as directed. Please refer to the Current Medication list given to you today.  *If you need a refill on your cardiac medications before your next appointment, please call your pharmacy*   Testing/Procedures: Echocardiogram Your physician has requested that you have an echocardiogram. Echocardiography is a painless test that uses sound waves to create images of your heart. It provides your doctor with information about the size and shape of your heart and how well your heart's chambers and valves are working. This procedure takes approximately one hour. There are no restrictions for this procedure. Please do NOT wear cologne, perfume, aftershave, or lotions (deodorant is allowed). Please arrive 15 minutes prior to your appointment time.   Zio Cardiac Monitor - this will be mailed to you Your physician has recommended that you wear an event monitor. Event monitors are medical devices that record the heart's electrical activity. Doctors most often [place] these monitors to diagnose arrhythmias. Arrhythmias are problems with the speed or rhythm of the heartbeat. The monitor is a small, portable device. You can wear one while you do your normal daily activities. This is usually used to diagnose what is causing palpitations/syncope (passing out).    Follow-Up: At [facility], you and your health needs are our priority.  As part of our continuing mission to provide you with exceptional heart care, we have created designated Provider Care Teams.  These Care Teams include your primary Cardiologist (physician) and Advanced Practice Providers (APPs -  Physician Assistants and Nurse Practitioners) who all work together to provide you with the care you need, when you need it.  We recommend signing up for the patient portal called "MyChart".  Sign up information is provided on this After Visit Summary.  MyChart  is used to connect with patients for Virtual Visits (Telemedicine).  Patients are able to view lab/test results, encounter notes, upcoming appointments, etc.  Non-urgent messages can be sent to your provider as well.   To learn more about what you can do with MyChart, go to [email or web address].    Your next appointment:   After echocardiogram (at least 4 weeks out)  Provider:   York Pellant, MD   Christena Deem- Long Term Monitor Instructions  Your physician has requested you wear a ZIO patch monitor for 14 days.  This is a single patch monitor. [employer] supplies one patch monitor per enrollment. Additional stickers are not available. Please do not apply patch if you will be having a Nuclear Stress Test,  Echocardiogram, Cardiac CT, MRI, or Chest Xray during the period you would be wearing the  monitor. The patch cannot be worn during these tests. You cannot remove and re-apply the  ZIO XT patch monitor.  Your ZIO patch monitor will be mailed 3 day USPS to your address on file. It may take 3-5 days  to receive your monitor after you have been enrolled.  Once you have received your monitor, please review the enclosed instructions. Your monitor  has already been registered assigning a specific monitor serial # to you.  Billing and Patient Assistance Program Information  We have supplied [employer] with any of your insurance information on file for billing purposes. [employer] offers a sliding scale Patient Assistance Program for patients that do not have  insurance, or whose insurance does not completely cover the cost of the ZIO monitor.  You must apply for the Patient Assistance Program to qualify for this discounted rate.  To apply, please call [employer] at [phone], select option 4, select option 2, ask to apply for  Patient Assistance Program. Meredeth Ide will ask your household income, and how many people  are in your household. They will quote your out-of-pocket cost based on that  information.  [employer] will also be able to set up a 66-month, interest-free payment plan if needed.  Applying the monitor   Shave hair from upper left chest.  Hold abrader disc by orange tab. Rub abrader in 40 strokes over the upper left chest as  indicated in your monitor instructions.  Clean area with 4 enclosed alcohol pads. Let dry.  Apply patch as indicated in monitor instructions. Patch will be placed under collarbone on left  side of chest with arrow pointing upward.  Rub patch adhesive wings for 2 minutes. Remove white label marked "1". Remove the white  label marked "2". Rub patch adhesive wings for 2 additional minutes.  While looking in a mirror, press and release button in center of patch. A small green light will  flash 3-4 times. This will be your only indicator that the monitor has been turned on.  Do not shower for the first 24 hours. You may shower after the first 24 hours.  Press the button if you feel a symptom. You will hear a small click. Record Date, Time and  Symptom in the Patient Logbook.  When you are ready to remove the patch, follow instructions on the last 2 pages of Patient  Logbook. Stick patch monitor onto the last page of Patient Logbook.  Place Patient Logbook in the blue and white box. Use locking tab on box and tape box closed  securely. The blue and white box has prepaid postage on it. Please place it in the mailbox as  soon as possible. Your physician should have your test results approximately 7 days after the  monitor has been mailed back to [facility].  Call [facility] Customer Care at [phone] if you have questions regarding  your ZIO XT patch monitor. Call them immediately if you see an orange light blinking on your  monitor.  If your monitor falls off in less than 4 days, contact our Monitor department at [phone].  If your monitor becomes loose or falls off after 4 days call [employer] at [phone] for  suggestions on  securing your monitor

## 2023-03-10 NOTE — Progress Notes (Signed)
Electrophysiology Office Note:    Date:  03/10/2023   ID:  Richard Irwin, DOB [birth year], MRN [id number]  PCP:  Daisy Floro, MD    HeartCare Providers Cardiologist:  Rollene Rotunda, MD Electrophysiologist:  Maurice Small, MD     Referring MD: Daisy Floro, MD   History of Present Illness:    Richard Irwin is a 77 y.o. male with a hx of CHFrEF (EF 35-40%), HTN, PAF and other issues listed below referred to EP clinic for management of persistent AF.  He has had an episode for syncope in 2021 in the setting of alcohol withdrawal.  During the admission he had an episode of NSVT. EF was normal at that time. He quit drinking and remains abstinent.   He saw Dr. Antoine Poche in September of this year after AF was detected incidentally during a routine visit to the [place]. Repeat TTE showed an EF drop to 35-40%. Eliquis and betablocker were started. We was not particularly symptomatic. DCCV was performed, but AF recurred prior to his next follow-up.  He underwent A-fib ablation on March 22.  He did well for a period but had recurrence and required cardioversion on April 29 --during the blinding period. He has not had any symptoms since to suggest recurrence.  He reports that he feels well today - no chest pain, syncope, near-syncope, change in baseline shortness of breath with exertion.    Past Medical History:  Diagnosis Date   Acute blood loss anemia    Anemia    Diverticulosis    Emphysema/COPD (HCC)    Esophageal varices (HCC)    H/O: GI bleed    Hypertension    Insomnia    Status post colonoscopy with polypectomy    with bleed   Thoracic ascending aortic aneurysm (HCC) 05/07/2013   Tubular adenoma     Past Surgical History:  Procedure Laterality Date   APPENDECTOMY     ATRIAL FIBRILLATION ABLATION N/A 12/09/2022   Procedure: ATRIAL FIBRILLATION ABLATION;  Surgeon: Maurice Small, MD;  Location: [facility]VAS[facility] LAB;  Service: Cardiovascular;   Laterality: N/A;   CARDIOVERSION N/A 07/07/2022   Procedure: CARDIOVERSION;  Surgeon: Jake Bathe, MD;  Location: [facility] ENDOSCOPY;  Service: Cardiovascular;  Laterality: N/A;   CARDIOVERSION N/A 01/16/2023   Procedure: CARDIOVERSION;  Surgeon: Meriam Sprague, MD;  Location: [facility]VASIVE CV LAB;  Service: Cardiovascular;  Laterality: N/A;   COLONOSCOPY WITH PROPOFOL N/A 07/09/2014   Procedure: COLONOSCOPY WITH PROPOFOL;  Surgeon: Hilarie Fredrickson, MD;  Location: [facility] ENDOSCOPY;  Service: Endoscopy;  Laterality: N/A;   EXCISIONAL HEMORRHOIDECTOMY     HERNIA REPAIR     ROTATOR CUFF REPAIR      Current Medications: Current Meds  Medication Sig   alendronate (FOSAMAX) 70 MG tablet Take 70 mg by mouth every Saturday.   Cholecalciferol (VITAMIN D) 125 MCG (5000 UT) CAPS Take 5,000 Units by mouth daily.   magnesium oxide (MAG-OX) 400 (240 Mg) MG tablet Take 400 mg by mouth daily.   metoprolol succinate (TOPROL-XL) 25 MG 24 hr tablet Take 25 mg by mouth See admin instructions. Take with 50 mg for a total of 75 mg daily   metoprolol succinate (TOPROL-XL) 50 MG 24 hr tablet Take 1 tablet (50 mg total) by mouth every morning AND 0.5 tablets (25 mg total) every evening.  . (Patient taking differently: Take in morning with 25 mg for a total of 75 mg daily)   rivaroxaban (XARELTO)  20 MG TABS tablet Take 1 tablet (20 mg total) by mouth daily with supper.   rosuvastatin (CRESTOR) 20 MG tablet Take 1 tablet (20 mg total) by mouth daily.   sacubitril-valsartan (ENTRESTO) 49-51 MG Take 0.5 tablets by mouth 2 (two) times daily.   triamcinolone cream (KENALOG) 0.1 % SMARTSIG:1 Application Topical 2-3 Times Daily     Allergies:   Lisinopril   Social History   Socioeconomic History   Marital status: Married    Spouse name: Not on file   Number of children: 1   Years of education: Not on file   Highest education level: Not on file  Occupational History   Occupation: retired Airline pilot  Tobacco Use   Smoking  status: Never   Smokeless tobacco: Never   Tobacco comments:    Never smoke 01/06/23  Vaping Use   Vaping Use: Never used  Substance and Sexual Activity   Alcohol use: Not Currently    Comment: beer 2-4 daily   Drug use: No   Sexual activity: Yes  Other Topics Concern   Not on file  Social History Narrative   Not on file   Social Determinants of Health   Financial Resource Strain: Not on file  Food Insecurity: Not on file  Transportation Needs: Not on file  Physical Activity: Not on file  Stress: Not on file  Social Connections: Not on file     Family History: The patient's family history includes Cirrhosis in his father; Lung cancer in his mother. There is no history of Colon cancer, Pancreatic cancer, Rectal cancer, or Stomach cancer.  ROS:   Please see the history of present illness.    All other systems reviewed and are negative.  EKGs/Labs/Other Studies Reviewed Today:     3 day Ziopatch AF: HR 47-171, AVG 91  Rare PVCs, sometimes occurring in bigeminy  TTE 06/20/2022 EF 35-40%, mildly dilated LA  EKG:  Last EKG results: today - Atrial fibrillation with RVR, PVCs. Incomplete right bundle branch block   Recent Labs: 06/06/2022: TSH 1.830 01/06/2023: BUN 5; Creatinine, Ser 0.88; Hemoglobin 14.5; Platelets 117; Potassium 3.9; Sodium 135     Physical Exam:    VS:  BP 116/68   Pulse 67   Ht 5\' 11"  (1.803 m)   Wt 160 lb (72.6 kg)   SpO2 99%   BMI 22.32 kg/m     Wt Readings from Last 3 Encounters:  03/10/23 160 lb (72.6 kg)  01/27/23 163 lb 3.2 oz (74 kg)  01/16/23 165 lb (74.8 kg)     GEN: Well nourished, well developed in no acute distress CARDIAC: Irregular rhythm, no murmurs, rubs, gallops RESPIRATORY:  Normal work of breathing MUSCULOSKELETAL: no edema    ASSESSMENT & PLAN:    Atrial fibrillation:  persistent, likely the cause of CHFrEF.  I think rhythm control is indicated to prevent progression of CHF. continue xarelto  Secondary  hypercoagulable state: CHA2DS2-VASc score is 5 Continue Xarelto 20  PE:  s/p course of lovenox, now on xarelto. Venous dopplers were negative   CHFrEF:  EF 35-40%  on metoprol succinate 25, entresto 49-51. Pt appears well-compensated today. Repeat echocardiogram  Frequent PVCs: On ECG today (March 10, 2023) Will place Zio patch to assess burden     Medication Adjustments/Labs and Tests Ordered: Current medicines are reviewed at length with the patient today.  Concerns regarding medicines are outlined above.  Orders Placed This Encounter  Procedures   EKG 12-Lead   No orders of  the defined types were placed in this encounter.    Signed, Maurice Small, MD  03/10/2023 11:04 AM    [facility]

## 2023-03-18 DIAGNOSIS — I4819 Other persistent atrial fibrillation: Secondary | ICD-10-CM

## 2023-03-30 DIAGNOSIS — I4819 Other persistent atrial fibrillation: Secondary | ICD-10-CM | POA: Diagnosis not present

## 2023-04-05 ENCOUNTER — Ambulatory Visit (HOSPITAL_COMMUNITY): Payer: Medicare Other | Attending: Cardiovascular Disease

## 2023-04-05 DIAGNOSIS — I4819 Other persistent atrial fibrillation: Secondary | ICD-10-CM | POA: Insufficient documentation

## 2023-04-05 LAB — ECHOCARDIOGRAM COMPLETE
Area-P 1/2: 2.44 cm2
P 1/2 time: 874 msec
S' Lateral: 2.8 cm

## 2023-04-07 ENCOUNTER — Telehealth: Payer: Self-pay

## 2023-04-07 MED ORDER — METOPROLOL SUCCINATE ER 50 MG PO TB24: 50.0000 mg | ORAL_TABLET | Freq: Every day | ORAL | 3 refills | Status: DC

## 2023-04-07 MED ORDER — METOPROLOL SUCCINATE ER 25 MG PO TB24: 25.0000 mg | ORAL_TABLET | Freq: Every day | ORAL | 3 refills | Status: DC

## 2023-04-07 NOTE — Telephone Encounter (Signed)
Pt calling stating that he would like to take metoprolol 50 mg and metoprolol 25 mg, totally 75 mg, instead of having to cut the 50 mg in half. Would Dr. Antoine Poche like to refill these medication that way the pt is requesting? Please address

## 2023-04-07 NOTE — Telephone Encounter (Signed)
Unable to reach pt or leave a message  New script sent to the pharmacy

## 2023-04-14 DIAGNOSIS — H2513 Age-related nuclear cataract, bilateral: Secondary | ICD-10-CM | POA: Diagnosis not present

## 2023-04-14 DIAGNOSIS — H18413 Arcus senilis, bilateral: Secondary | ICD-10-CM | POA: Diagnosis not present

## 2023-04-14 DIAGNOSIS — H40023 Open angle with borderline findings, high risk, bilateral: Secondary | ICD-10-CM | POA: Diagnosis not present

## 2023-04-14 DIAGNOSIS — H25043 Posterior subcapsular polar age-related cataract, bilateral: Secondary | ICD-10-CM | POA: Diagnosis not present

## 2023-04-14 DIAGNOSIS — H2511 Age-related nuclear cataract, right eye: Secondary | ICD-10-CM | POA: Diagnosis not present

## 2023-04-17 ENCOUNTER — Encounter: Payer: Self-pay | Admitting: Cardiovascular Disease

## 2023-04-17 ENCOUNTER — Ambulatory Visit: Payer: Medicare Other | Attending: Cardiovascular Disease | Admitting: Cardiovascular Disease

## 2023-04-17 VITALS — BP 120/78 | HR 68 | Ht 71.0 in | Wt 161.6 lb

## 2023-04-17 DIAGNOSIS — I4819 Other persistent atrial fibrillation: Secondary | ICD-10-CM

## 2023-04-17 NOTE — Progress Notes (Signed)
Electrophysiology Office Note:    Date:  04/17/2023   ID:  Richard Irwin, DOB 11-Jun-1946, MRN [id number]  PCP:  Daisy Floro, MD   [facility] Providers Cardiologist:  Rollene Rotunda, MD Electrophysiologist:  Maurice Small, MD     Referring MD: Daisy Floro, MD   History of Present Illness:    Richard Irwin is a 77 y.o. male with a hx of CHFrEF (EF 35-40%), HTN, PAF and other issues listed below referred to EP clinic for management of persistent AF.  He has had an episode for syncope in 2021 in the setting of alcohol withdrawal.  During the admission he had an episode of NSVT. EF was normal at that time. He quit drinking and remains abstinent.   He saw Dr. Antoine Poche in September of this year after AF was detected incidentally during a routine visit to the [place]. Repeat TTE showed an EF drop to 35-40%. Eliquis and betablocker were started. We was not particularly symptomatic. DCCV was performed, but AF recurred prior to his next follow-up.  He underwent A-fib ablation on March 22.  He did well for a period but had recurrence and required cardioversion on April 29 --during the blinding period. He has not had any symptoms since to suggest recurrence.  He reports that he feels well today - no chest pain, syncope, near-syncope, change in baseline shortness of breath with exertion.    Past Medical History:  Diagnosis Date   Acute blood loss anemia    Anemia    Diverticulosis    Emphysema/COPD (HCC)    Esophageal varices (HCC)    H/O: GI bleed    Hypertension    Insomnia    Status post colonoscopy with polypectomy    with bleed   Thoracic ascending aortic aneurysm (HCC) 05/07/2013   Tubular adenoma     Past Surgical History:  Procedure Laterality Date   APPENDECTOMY     ATRIAL FIBRILLATION ABLATION N/A 12/09/2022   Procedure: ATRIAL FIBRILLATION ABLATION;  Surgeon: Maurice Small, MD;  Location: [facility] INVASIVE CV LAB;  Service: Cardiovascular;   Laterality: N/A;   CARDIOVERSION N/A 07/07/2022   Procedure: CARDIOVERSION;  Surgeon: Jake Bathe, MD;  Location: [facility] ENDOSCOPY;  Service: Cardiovascular;  Laterality: N/A;   CARDIOVERSION N/A 01/16/2023   Procedure: CARDIOVERSION;  Surgeon: Meriam Sprague, MD;  Location: [facility] INVASIVE CV LAB;  Service: Cardiovascular;  Laterality: N/A;   COLONOSCOPY WITH PROPOFOL N/A 07/09/2014   Procedure: COLONOSCOPY WITH PROPOFOL;  Surgeon: Hilarie Fredrickson, MD;  Location: [facility] ENDOSCOPY;  Service: Endoscopy;  Laterality: N/A;   EXCISIONAL HEMORRHOIDECTOMY     HERNIA REPAIR     ROTATOR CUFF REPAIR      Current Medications: Current Meds  Medication Sig   alendronate (FOSAMAX) 70 MG tablet Take 70 mg by mouth every Saturday.   Cholecalciferol (VITAMIN D) 125 MCG (5000 UT) CAPS Take 5,000 Units by mouth daily.   magnesium oxide (MAG-OX) 400 (240 Mg) MG tablet Take 400 mg by mouth daily.   metoprolol succinate (TOPROL-XL) 25 MG 24 hr tablet Take 1 tablet (25 mg total) by mouth daily. Take with 50 mg for a total of 75 mg daily   metoprolol succinate (TOPROL-XL) 50 MG 24 hr tablet Take 1 tablet (50 mg total) by mouth every morning AND 0.5 tablets (25 mg total) every evening.  . (Patient taking differently: Take in morning with 25 mg for a total of 75 mg daily)   metoprolol  succinate (TOPROL-XL) 50 MG 24 hr tablet Take 1 tablet (50 mg total) by mouth daily. Take with or immediately following a meal.   rivaroxaban (XARELTO) 20 MG TABS tablet Take 1 tablet (20 mg total) by mouth daily with supper.   rosuvastatin (CRESTOR) 20 MG tablet Take 1 tablet (20 mg total) by mouth daily.   sacubitril-valsartan (ENTRESTO) 49-51 MG Take 0.5 tablets by mouth 2 (two) times daily.   triamcinolone cream (KENALOG) 0.1 % SMARTSIG:1 Application Topical 2-3 Times Daily     Allergies:   Lisinopril   Social History   Socioeconomic History   Marital status: Married    Spouse name: Not on file   Number of children: 1   Years of  education: Not on file   Highest education level: Not on file  Occupational History   Occupation: retired Airline pilot  Tobacco Use   Smoking status: Never   Smokeless tobacco: Never   Tobacco comments:    Never smoke 01/06/23  Vaping Use   Vaping status: Never Used  Substance and Sexual Activity   Alcohol use: Not Currently    Comment: beer 2-4 daily   Drug use: No   Sexual activity: Yes  Other Topics Concern   Not on file  Social History Narrative   Not on file   Social Determinants of Health   Financial Resource Strain: Not on file  Food Insecurity: Not on file  Transportation Needs: Not on file  Physical Activity: Not on file  Stress: Not on file  Social Connections: Not on file     Family History: The patient's family history includes Cirrhosis in his father; Lung cancer in his mother. There is no history of Colon cancer, Pancreatic cancer, Rectal cancer, or Stomach cancer.  ROS:   Please see the history of present illness.    All other systems reviewed and are negative.  EKGs/Labs/Other Studies Reviewed Today:     3 day Ziopatch AF: HR 47-171, AVG 91  Rare PVCs, sometimes occurring in bigeminy  3 day Zio 02/2023 Sinus rhythm HR 51-85, avg 65 Frequent PVCs, burden 14% -- triplets and a 4 beat run occurred Symptom episodes correlated with frequent PVCs  TTE 06/20/2022 EF 35-40%, mildly dilated LA  TTE 04/05/2023 EF 40-45%; left atrium is moderately dilated; aorta dilated to 47mm  EKG:  Last EKG results: today - Atrial fibrillation with RVR, PVCs. Incomplete right bundle branch block   Recent Labs: 06/06/2022: TSH 1.830 01/06/2023: BUN 5; Creatinine, Ser 0.88; Hemoglobin 14.5; Platelets 117; Potassium 3.9; Sodium 135     Physical Exam:    VS:  BP 120/78   Pulse 68   Ht 5\' 11"  (1.803 m)   Wt 161 lb 9.6 oz (73.3 kg)   SpO2 96%   BMI 22.54 kg/m     Wt Readings from Last 3 Encounters:  04/17/23 161 lb 9.6 oz (73.3 kg)  03/10/23 160 lb (72.6 kg)   01/27/23 163 lb 3.2 oz (74 kg)     GEN: Well nourished, well developed in no acute distress CARDIAC: Irregular rhythm, no murmurs, rubs, gallops RESPIRATORY:  Normal work of breathing MUSCULOSKELETAL: no edema    ASSESSMENT & PLAN:    Atrial fibrillation:  persistent, likely the cause of CHFrEF.  S/p AF ablation 12/09/2022 No AF on monitor  continue xarelto  Secondary hypercoagulable state: CHA2DS2-VASc score is 5 Continue Xarelto 20  PE:  s/p course of lovenox, now on xarelto. Venous dopplers were negative  CHFrEF:  EF 35-40% --  improved to 40-45%  on metoprol succinate 25, entresto 49-51. Pt appears well-compensated today.  Frequent PVCs: Burden 14% on monitor  Seems unlikely to be contributing to heart failure since his EF has improved I do not think antiarrhythmic drug is warranted; he is not a good candidate for ablation I will keep him on metoprolol      Medication Adjustments/Labs and Tests Ordered: Current medicines are reviewed at length with the patient today.  Concerns regarding medicines are outlined above.  Orders Placed This Encounter  Procedures   EKG 12-Lead   No orders of the defined types were placed in this encounter.    Signed, Maurice Small, MD  04/17/2023 5:41 PM    [facility]

## 2023-04-17 NOTE — Patient Instructions (Signed)
Medication Instructions:  Your physician recommends that you continue on your current medications as directed. Please refer to the Current Medication list given to you today. *If you need a refill on your cardiac medications before your next appointment, please call your pharmacy*   Follow-Up: At [facility], you and your health needs are our priority.  As part of our continuing mission to provide you with exceptional heart care, we have created designated Provider Care Teams.  These Care Teams include your primary Cardiologist (physician) and Advanced Practice Providers (APPs -  Physician Assistants and Nurse Practitioners) who all work together to provide you with the care you need, when you need it.  We recommend signing up for the patient portal called "MyChart".  Sign up information is provided on this After Visit Summary.  MyChart is used to connect with patients for Virtual Visits (Telemedicine).  Patients are able to view lab/test results, encounter notes, upcoming appointments, etc.  Non-urgent messages can be sent to your provider as well.   To learn more about what you can do with MyChart, go to [email or web address].    Your next appointment:   6 month(s)  Provider:   Augustus Mealor, MD  

## 2023-05-04 DIAGNOSIS — I2510 Atherosclerotic heart disease of native coronary artery without angina pectoris: Secondary | ICD-10-CM | POA: Insufficient documentation

## 2023-05-04 DIAGNOSIS — E785 Hyperlipidemia, unspecified: Secondary | ICD-10-CM | POA: Insufficient documentation

## 2023-05-04 NOTE — Progress Notes (Signed)
Cardiology Office Note:   Date:  05/05/2023  ID:  Richard Irwin, DOB 21-Jan-1946, MRN [id number] PCP: Daisy Floro, MD  [facility] Providers Cardiologist:  Rollene Rotunda, MD Electrophysiologist:  Maurice Small, MD {  History of Present Illness:   Richard Irwin is a 77 y.o. male  with a history of chronic HFrEF, HTN, CAD, PE, esophageal varices, COPD, thoracic aortic aneurysm, atrial fibrillation who presents for follow up.  He has had an episode for syncope in 2021 in the setting of alcohol withdrawal.  During the admission he had an episode of NSVT. EF was normal at that time. He quit drinking and remains abstinent. He was found to have AF detected incidentally during a routine visit to the [place]. Repeat TTE showed an EF drop to 35-40%. Eliquis and betablocker were started. We was not particularly symptomatic. DCCV was performed, but AF recurred prior to his next follow-up. Patient is on Xarelto for a CHADS2VASC score of 5. He is s/p afib ablation with Dr Nelly Laurence on 12/09/22.   He had recurrent atrial fib and had DCCV in April.     Since last seen he thinks he feels well.  He does a little walking and a lot of yard work.  The patient denies any new symptoms such as chest discomfort, neck or arm discomfort. There has been no new shortness of breath, PND or orthopnea. There have been no reported palpitations, presyncope or syncope.   ROS: As stated in the HPI and negative for all other systems.  Studies Reviewed:    EKG:   NA  Risk Assessment/Calculations:    CHA2DS2-VASc Score = 5   This indicates a 7.2% annual risk of stroke. The patient's score is based upon: CHF History: 1 HTN History: 1 Diabetes History: 0 Stroke History: 0 Vascular Disease History: 1 Age Score: 2 Gender Score: 0             Physical Exam:   VS:  BP 116/62 (BP Location: Left Arm, Patient Position: Sitting, Cuff Size: Normal)   Pulse (!) 58   Ht 5\' 11"  (1.803 m)   Wt 159 lb 3.2 oz  (72.2 kg)   SpO2 98%   BMI 22.20 kg/m    Wt Readings from Last 3 Encounters:  05/05/23 159 lb 3.2 oz (72.2 kg)  04/17/23 161 lb 9.6 oz (73.3 kg)  03/10/23 160 lb (72.6 kg)     GEN: Well nourished, well developed in no acute distress NECK: No JVD; No carotid bruits CARDIAC: RRR, no murmurs, rubs, gallops RESPIRATORY:  Clear to auscultation without rales, wheezing or rhonchi  ABDOMEN: Soft, non-tender, non-distended EXTREMITIES:  No edema; No deformity   ASSESSMENT AND PLAN:   Persistent Atrial Fibrillation (ICD10:  I48.19): He seems to be maintaining sinus rhythm.  He tolerates anticoagulation.  He is today for blood work.  No change in therapy.    Secondary Hypercoagulable State (ICD10:  D68.69): He tolerates blood work and no bleeding contraindications.   Chronic HFrEF:  EF was 35 - 40% in October 2023.  I tried to titrate his Entresto.  He has gotten lightheaded with low blood pressures.  He will remain on the meds as listed which I think is his optimal medical therapy.    CAD: He has no symptoms.    His score was 8393.  We talked quite a bit about this.  He has no symptoms.  He is vigorously active.  He needs primary risk reduction.  He knows to present should he have any symptoms in the future such as chest discomfort or shortness of breath.   HTN: The blood pressure being managed in the context of treating his heart failure.    Dyslipidemia: The goal is an LDL in the 50s.  His LDL was 67 so I will increase his Crestor to 40 mg daily and check a lipid profile in about 3 months.            Follow up with me in three months.   Signed, Rollene Rotunda, MD

## 2023-05-05 ENCOUNTER — Ambulatory Visit: Payer: Medicare Other | Attending: Cardiology | Admitting: Cardiology

## 2023-05-05 ENCOUNTER — Encounter: Payer: Self-pay | Admitting: Cardiology

## 2023-05-05 VITALS — BP 116/62 | HR 58 | Ht 71.0 in | Wt 159.2 lb

## 2023-05-05 DIAGNOSIS — I10 Essential (primary) hypertension: Secondary | ICD-10-CM

## 2023-05-05 DIAGNOSIS — D6869 Other thrombophilia: Secondary | ICD-10-CM | POA: Diagnosis not present

## 2023-05-05 DIAGNOSIS — I4819 Other persistent atrial fibrillation: Secondary | ICD-10-CM

## 2023-05-05 DIAGNOSIS — I2510 Atherosclerotic heart disease of native coronary artery without angina pectoris: Secondary | ICD-10-CM

## 2023-05-05 DIAGNOSIS — E785 Hyperlipidemia, unspecified: Secondary | ICD-10-CM

## 2023-05-05 MED ORDER — ROSUVASTATIN CALCIUM 40 MG PO TABS: 40.0000 mg | ORAL_TABLET | Freq: Every day | ORAL | 3 refills | Status: DC

## 2023-05-05 NOTE — Patient Instructions (Addendum)
Medication Instructions:  Your physician has recommended you make the following change in your medication:   -Increase rosuvastatin (crestor) to 40mg  once daily.  *If you need a refill on your cardiac medications before your next appointment, please call your pharmacy*   Lab Work: Your physician recommends that you return for lab work in: 3 months for FASTING lipids (cholesterol levels)  If you have labs (blood work) drawn today and your tests are completely normal, you will receive your results only by: MyChart Message (if you have MyChart) OR A paper copy in the mail If you have any lab test that is abnormal or we need to change your treatment, we will call you to review the results.    Follow-Up: At [facility], you and your health needs are our priority.  As part of our continuing mission to provide you with exceptional heart care, we have created designated Provider Care Teams.  These Care Teams include your primary Cardiologist (physician) and Advanced Practice Providers (APPs -  Physician Assistants and Nurse Practitioners) who all work together to provide you with the care you need, when you need it.  We recommend signing up for the patient portal called "MyChart".  Sign up information is provided on this After Visit Summary.  MyChart is used to connect with patients for Virtual Visits (Telemedicine).  Patients are able to view lab/test results, encounter notes, upcoming appointments, etc.  Non-urgent messages can be sent to your provider as well.   To learn more about what you can do with MyChart, go to [email or web address].    Your next appointment:   3 month(s)  Provider:   Rollene Rotunda, MD     Other Instructions  [facility] [employer]: Website: [email or web address]  DR. HOCHREIN RECOMMENDS YOU PURCHASE  " Kardia" By [employer]. FROM THE  G[employer]  APP PLAY STORE.  THE APP IS FREE , BUT THE  EQUIPMENT HAS A COST. IT ALLOWS YOU TO OBTAIN  A RECORDING OF YOUR HEART RATE AND RHYTHM BY PROVIDING A SHORT STRIP THAT YOU CAN SHARE WITH YOUR PROVIDER.     [facility] - sending an EKG Download app and set up profile. Run EKG - by placing 1-2 fingers on the silver plates After EKG is complete - Download PDF  - Skip password (if you apply a password the provider will need it to view the EKG) Click share button (square with upward arrow) in bottom left corner To send: choose MyChart (first time log into MyChart)  Pop up window about sending ECG Click continue Choose type of message Choose provider Type subject and message Click send (EKG should be attached)  - To send additional EKGs in one message click the paperclip image and bottom of page to attach.

## 2023-05-11 DIAGNOSIS — R7301 Impaired fasting glucose: Secondary | ICD-10-CM | POA: Diagnosis not present

## 2023-05-11 DIAGNOSIS — R634 Abnormal weight loss: Secondary | ICD-10-CM | POA: Diagnosis not present

## 2023-05-26 DIAGNOSIS — H2512 Age-related nuclear cataract, left eye: Secondary | ICD-10-CM | POA: Diagnosis not present

## 2023-05-26 DIAGNOSIS — H2511 Age-related nuclear cataract, right eye: Secondary | ICD-10-CM | POA: Diagnosis not present

## 2023-06-19 ENCOUNTER — Telehealth: Payer: Self-pay | Admitting: Gastroenterology

## 2023-06-19 NOTE — Telephone Encounter (Signed)
PT wants to discuss new symptoms with nurse. He has experienced profound weight loss and a change in bowel habits. He is concerned it is associated with colon cancer. Please advise.

## 2023-06-19 NOTE — Telephone Encounter (Signed)
Attempted to reach pt- no answer and voicemail not set up

## 2023-06-20 NOTE — Telephone Encounter (Signed)
Attempted to reach pt and the line was answered but then hung up. Attempted again and the line was busy

## 2023-06-21 NOTE — Telephone Encounter (Signed)
Attempted to reach the pt again with no answer and no voice mail.  Will await further communication from the pt

## 2023-06-22 DIAGNOSIS — D696 Thrombocytopenia, unspecified: Secondary | ICD-10-CM | POA: Diagnosis not present

## 2023-06-22 DIAGNOSIS — M810 Age-related osteoporosis without current pathological fracture: Secondary | ICD-10-CM | POA: Diagnosis not present

## 2023-06-22 DIAGNOSIS — M549 Dorsalgia, unspecified: Secondary | ICD-10-CM | POA: Diagnosis not present

## 2023-06-22 DIAGNOSIS — M353 Polymyalgia rheumatica: Secondary | ICD-10-CM | POA: Diagnosis not present

## 2023-06-22 DIAGNOSIS — M1990 Unspecified osteoarthritis, unspecified site: Secondary | ICD-10-CM | POA: Diagnosis not present

## 2023-06-23 DIAGNOSIS — H2512 Age-related nuclear cataract, left eye: Secondary | ICD-10-CM | POA: Diagnosis not present

## 2023-07-27 DIAGNOSIS — H43312 Vitreous membranes and strands, left eye: Secondary | ICD-10-CM | POA: Diagnosis not present

## 2023-07-27 DIAGNOSIS — Z961 Presence of intraocular lens: Secondary | ICD-10-CM | POA: Diagnosis not present

## 2023-08-01 DIAGNOSIS — I10 Essential (primary) hypertension: Secondary | ICD-10-CM | POA: Diagnosis not present

## 2023-08-01 DIAGNOSIS — E7800 Pure hypercholesterolemia, unspecified: Secondary | ICD-10-CM | POA: Diagnosis not present

## 2023-08-01 DIAGNOSIS — D696 Thrombocytopenia, unspecified: Secondary | ICD-10-CM | POA: Diagnosis not present

## 2023-08-04 DIAGNOSIS — I10 Essential (primary) hypertension: Secondary | ICD-10-CM | POA: Diagnosis not present

## 2023-08-04 DIAGNOSIS — E785 Hyperlipidemia, unspecified: Secondary | ICD-10-CM | POA: Diagnosis not present

## 2023-08-04 DIAGNOSIS — I2510 Atherosclerotic heart disease of native coronary artery without angina pectoris: Secondary | ICD-10-CM | POA: Diagnosis not present

## 2023-08-05 LAB — LIPID PANEL
Chol/HDL Ratio: 2.1 {ratio} (ref 0.0–5.0)
Cholesterol, Total: 141 mg/dL (ref 100–199)
HDL: 68 mg/dL (ref >39)
LDL Chol Calc (NIH): 60 mg/dL (ref 0–99)
Triglycerides: 63 mg/dL (ref 0–149)
VLDL Cholesterol Cal: 13 mg/dL (ref 5–40)

## 2023-08-07 DIAGNOSIS — I5022 Chronic systolic (congestive) heart failure: Secondary | ICD-10-CM | POA: Insufficient documentation

## 2023-08-07 NOTE — Progress Notes (Unsigned)
Cardiology Office Note:   Date:  08/10/2023  ID:  Richard Irwin, DOB September 07, 1946, MRN [id number] PCP: Daisy Floro, MD  [facility] Providers Cardiologist:  Rollene Rotunda, MD Electrophysiologist:  Maurice Small, MD {  History of Present Illness:   Richard Irwin is a 77 y.o. male chronic HFrEF, HTN, CAD, PE, esophageal varices, COPD, thoracic aortic aneurysm, atrial fibrillation who presents for follow up.  He has had an episode for syncope in 2021 in the setting of alcohol withdrawal.  During the admission he had an episode of NSVT. EF was normal at that time. He quit drinking and remains abstinent. He was found to have AF detected incidentally during a routine visit to the [place]. Repeat TTE showed an EF drop to 35-40%. Eliquis and beta blocker were started. We was not particularly symptomatic. DCCV was performed, but AF recurred prior to his next follow-up. Patient is on Xarelto for a CHADS2VASC score of 5. He is s/p afib ablation with Dr Nelly Laurence on 12/09/22.   He had recurrent atrial fib and had DCCV in April 2024.  He had an echo in July 2024 with an EF of 40 - 45%.  .     Since last being seen he has felt well.  He has been active.  Has been doing yard work.  He does a little walking. The patient denies any new symptoms such as chest discomfort, neck or arm discomfort. There has been no new shortness of breath, PND or orthopnea. There have been no reported palpitations, presyncope or syncope.   ROS: As stated in the HPI and negative for all other systems.  Studies Reviewed:    EKG:   NA  Risk Assessment/Calculations:    CHA2DS2-VASc Score = 5   This indicates a 7.2% annual risk of stroke. The patient's score is based upon: CHF History: 1 HTN History: 1 Diabetes History: 0 Stroke History: 0 Vascular Disease History: 1 Age Score: 2 Gender Score: 0       Physical Exam:   VS:  BP 120/78 (BP Location: Left Arm, Patient Position: Sitting, Cuff Size: Normal)    Pulse 69   Ht 5\' 11"  (1.803 m)   Wt 162 lb 12.8 oz (73.8 kg)   SpO2 99%   BMI 22.71 kg/m    Wt Readings from Last 3 Encounters:  08/10/23 162 lb 12.8 oz (73.8 kg)  05/05/23 159 lb 3.2 oz (72.2 kg)  04/17/23 161 lb 9.6 oz (73.3 kg)     GEN: Well nourished, well developed in no acute distress NECK: No JVD; No carotid bruits CARDIAC: RRR, no murmurs, rubs, gallops RESPIRATORY:  Clear to auscultation without rales, wheezing or rhonchi  ABDOMEN: Soft, non-tender, non-distended EXTREMITIES:  No edema; No deformity   ASSESSMENT AND PLAN:   Persistent Atrial Fibrillation (ICD10:  I48.19): He seems to be maintaining sinus rhythm.  He tolerates anticoagulation.  No change in therapy.  Secondary Hypercoagulable State (ICD10:  D68.69): He tolerates anticoagulation as above.   Chronic HFrEF:  EF was 35 - 40% in October 2023.   This was increased to 40 - 45% on echo in July.  I will titrate the Entresto to 96/103 twice daily.  Other meds remain as listed and we can continue to titrate at follow-up visits.   Plan is to reassess his ejection fraction probably in another 6 months and if he has not improved after further med titration I will probably do an ischemia workup with a CT  angiogram.  CAD: He does have a markedly elevated score but he said absolutely no symptoms despite being vigorously active.  We will continue with aggressive risk reduction.    HTN: The blood pressure being managed in the context of managing her heart failure.   Dyslipidemia: The 60 with an HDL of 68.  No change in therapy.    Aortic root dilatation:    This was 47 mm on echo.  I will follow-up with another CT in 1 year.       Follow up with APP in two months.   Signed, Rollene Rotunda, MD

## 2023-08-08 DIAGNOSIS — D6869 Other thrombophilia: Secondary | ICD-10-CM | POA: Diagnosis not present

## 2023-08-08 DIAGNOSIS — J358 Other chronic diseases of tonsils and adenoids: Secondary | ICD-10-CM | POA: Diagnosis not present

## 2023-08-08 DIAGNOSIS — E7800 Pure hypercholesterolemia, unspecified: Secondary | ICD-10-CM | POA: Diagnosis not present

## 2023-08-08 DIAGNOSIS — Z0000 Encounter for general adult medical examination without abnormal findings: Secondary | ICD-10-CM | POA: Diagnosis not present

## 2023-08-08 DIAGNOSIS — I7121 Aneurysm of the ascending aorta, without rupture: Secondary | ICD-10-CM | POA: Diagnosis not present

## 2023-08-08 DIAGNOSIS — D696 Thrombocytopenia, unspecified: Secondary | ICD-10-CM | POA: Diagnosis not present

## 2023-08-08 DIAGNOSIS — I5022 Chronic systolic (congestive) heart failure: Secondary | ICD-10-CM | POA: Diagnosis not present

## 2023-08-08 DIAGNOSIS — I10 Essential (primary) hypertension: Secondary | ICD-10-CM | POA: Diagnosis not present

## 2023-08-10 ENCOUNTER — Ambulatory Visit: Payer: Medicare Other | Attending: Cardiology | Admitting: Cardiology

## 2023-08-10 ENCOUNTER — Encounter: Payer: Self-pay | Admitting: Cardiology

## 2023-08-10 VITALS — BP 120/78 | HR 69 | Ht 71.0 in | Wt 162.8 lb

## 2023-08-10 DIAGNOSIS — E785 Hyperlipidemia, unspecified: Secondary | ICD-10-CM

## 2023-08-10 DIAGNOSIS — I4819 Other persistent atrial fibrillation: Secondary | ICD-10-CM | POA: Diagnosis not present

## 2023-08-10 DIAGNOSIS — I5022 Chronic systolic (congestive) heart failure: Secondary | ICD-10-CM | POA: Diagnosis not present

## 2023-08-10 DIAGNOSIS — I10 Essential (primary) hypertension: Secondary | ICD-10-CM

## 2023-08-10 DIAGNOSIS — D6869 Other thrombophilia: Secondary | ICD-10-CM

## 2023-08-10 MED ORDER — SACUBITRIL-VALSARTAN 97-103 MG PO TABS: 1.0000 | ORAL_TABLET | Freq: Two times a day (BID) | ORAL | 3 refills | Status: AC

## 2023-08-10 NOTE — Patient Instructions (Signed)
Medication Instructions:  Start taking new dose of sacubitril-valsartan 97-103 twice per day. New script printed and given to patient to be filled at [facility].  *If you need a refill on your cardiac medications before your next appointment, please call your pharmacy*     Follow-Up: At [facility], you and your health needs are our priority.  As part of our continuing mission to provide you with exceptional heart care, we have created designated Provider Care Teams.  These Care Teams include your primary Cardiologist (physician) and Advanced Practice Providers (APPs -  Physician Assistants and Nurse Practitioners) who all work together to provide you with the care you need, when you need it.  Your next appointment:   2 month(s)  Provider:   Any available  APP

## 2023-08-29 ENCOUNTER — Ambulatory Visit: Payer: Medicare Other | Admitting: Gastroenterology

## 2023-08-29 ENCOUNTER — Encounter: Payer: Self-pay | Admitting: Gastroenterology

## 2023-08-29 ENCOUNTER — Other Ambulatory Visit (INDEPENDENT_AMBULATORY_CARE_PROVIDER_SITE_OTHER): Payer: Medicare Other

## 2023-08-29 VITALS — BP 110/70 | HR 73 | Ht 71.0 in | Wt 165.0 lb

## 2023-08-29 DIAGNOSIS — Z860101 Personal history of adenomatous and serrated colon polyps: Secondary | ICD-10-CM

## 2023-08-29 DIAGNOSIS — Z8719 Personal history of other diseases of the digestive system: Secondary | ICD-10-CM | POA: Diagnosis not present

## 2023-08-29 DIAGNOSIS — R634 Abnormal weight loss: Secondary | ICD-10-CM | POA: Diagnosis not present

## 2023-08-29 DIAGNOSIS — I8500 Esophageal varices without bleeding: Secondary | ICD-10-CM

## 2023-08-29 LAB — CBC WITH DIFFERENTIAL/PLATELET
Basophils Absolute: 0 10*3/uL (ref 0.0–0.1)
Basophils Relative: 0.6 % (ref 0.0–3.0)
Eosinophils Absolute: 0.1 10*3/uL (ref 0.0–0.7)
Eosinophils Relative: 1.9 % (ref 0.0–5.0)
HCT: 41.7 % (ref 39.0–52.0)
Hemoglobin: 14 g/dL (ref 13.0–17.0)
Lymphocytes Relative: 34.4 % (ref 12.0–46.0)
Lymphs Abs: 1.7 10*3/uL (ref 0.7–4.0)
MCHC: 33.5 g/dL (ref 30.0–36.0)
MCV: 97.3 fL (ref 78.0–100.0)
Monocytes Absolute: 0.6 10*3/uL (ref 0.1–1.0)
Monocytes Relative: 12.1 % — ABNORMAL HIGH (ref 3.0–12.0)
Neutro Abs: 2.5 10*3/uL (ref 1.4–7.7)
Neutrophils Relative %: 51 % (ref 43.0–77.0)
Platelets: 136 10*3/uL — ABNORMAL LOW (ref 150.0–400.0)
RBC: 4.29 Mil/uL (ref 4.22–5.81)
RDW: 13.4 % (ref 11.5–15.5)
WBC: 4.9 10*3/uL (ref 4.0–10.5)

## 2023-08-29 LAB — COMPREHENSIVE METABOLIC PANEL
ALT: 11 U/L (ref 0–53)
AST: 20 U/L (ref 0–37)
Albumin: 4.2 g/dL (ref 3.5–5.2)
Alkaline Phosphatase: 49 U/L (ref 39–117)
BUN: 13 mg/dL (ref 6–23)
CO2: 29 meq/L (ref 19–32)
Calcium: 9.4 mg/dL (ref 8.4–10.5)
Chloride: 102 meq/L (ref 96–112)
Creatinine, Ser: 0.86 mg/dL (ref 0.40–1.50)
GFR: 83.68 mL/min (ref >60.00)
Glucose, Bld: 97 mg/dL (ref 70–99)
Potassium: 4.5 meq/L (ref 3.5–5.1)
Sodium: 137 meq/L (ref 135–145)
Total Bilirubin: 0.9 mg/dL (ref 0.2–1.2)
Total Protein: 7.2 g/dL (ref 6.0–8.3)

## 2023-08-29 LAB — PROTIME-INR
INR: 1.7 {ratio} — ABNORMAL HIGH (ref 0.8–1.0)
Prothrombin Time: 17.6 s — ABNORMAL HIGH (ref 9.6–13.1)

## 2023-08-29 LAB — TSH: TSH: 1.24 u[IU]/mL (ref 0.35–5.50)

## 2023-08-29 NOTE — Progress Notes (Signed)
Assessment     Weight loss, now stabilized - likely related to stopping alcohol History of alcoholic hepatitis, esophageal varices, thrombocytopenia - R/O cirrhosis Personal history of adenomatous colon polyps  Afib, history of PE on Xarelto   Recommendations      CT AP - R/O cirrhosis, portal hypertension  CBC, CMP, PT/INR, TSH, tTG, IgA No future colonoscopy recalls planned due to age Consider EGD to assess varices  REV with Dr. Doy Hutching in 2 months   HPI    [name] with esophageal varices and a history of alcoholic hepatitis who relates weight loss. He has chronic HFrEF, HTN, CAD, PE, COPD, thoracic aortic aneurysm, atrial fibrillation. He is accompanied by his wife. Alcoholic hepatitis in 2021 and he has remained abstinent from alcohol since then. He lost weight from 185 to 160-165 after stopping alcohol and his weight has remained stable in the 160-165 range since. Denies abdominal pain, constipation, diarrhea, change in stool caliber, melena, hematochezia, nausea, vomiting, dysphagia, reflux symptoms, chest pain.    Colonoscopy 11/2019 - Two 5 to 6 mm polyps in the transverse colon, removed with a cold snare. Resected and retrieved. - Mild diverticulosis in the left colon. - Internal hemorrhoids. - The examination was otherwise normal on direct and retroflexion views.   EGD 11/2019 - Grade I esophageal varices with no bleeding and no stigmata of recent bleeding. - Benign-appearing esophageal stenosis. - Erosive gastropathy with no bleeding and no stigmata of recent bleeding. Biopsied. - Erythematous mucosa in the gastric fundus and gastric body. Biopsied. - Normal duodenal bulb and second portion of the duodenum.   Labs / Imaging       Latest Ref Rng & Units 04/08/2020   12:14 PM 01/23/2020   10:36 AM 11/26/2019   11:05 AM  Hepatic Function  Total Protein 6.5 - 8.1 g/dL 7.3  6.6  6.6   Albumin 3.5 - 5.0 g/dL 3.4  3.0  2.7   AST 15 - 41 U/L 29  31   54   ALT 0 - 44 U/L 15  15  20    Alk Phosphatase 38 - 126 U/L 111  93  118   Total Bilirubin 0.3 - 1.2 mg/dL 1.5  1.7  2.6   Bilirubin, Direct 0.0 - 0.3 mg/dL   1.3        Latest Ref Rng & Units 01/06/2023    1:04 PM 11/21/2022    8:53 AM 09/26/2022    8:50 AM  CBC  WBC 4.0 - 10.5 K/uL 5.5  5.6  5.5   Hemoglobin 13.0 - 17.0 g/dL 41.3  24.4  01.0   Hematocrit 39.0 - 52.0 % 44.5  45.4  45.9   Platelets 150 - 400 K/uL 117  115  123    Current Medications, Allergies, Past Medical History, Past Surgical History, Family History and Social History were reviewed in [employer] record.   Physical Exam: General: Well developed, well nourished, no acute distress Head: Normocephalic and atraumatic Eyes: Sclerae anicteric, EOMI Ears: Normal auditory acuity Mouth: No deformities or lesions noted Lungs: Clear throughout to auscultation Heart: Regular rate and rhythm; No murmurs, rubs or bruits Abdomen: Soft, non tender and non distended. No masses, hepatosplenomegaly or hernias noted. Normal Bowel sounds Rectal: Not done Musculoskeletal: Symmetrical with no gross deformities  Pulses:  Normal pulses noted Extremities: No edema or deformities noted Neurological: Alert oriented x 4, grossly nonfocal Psychological:  Alert and cooperative. Normal  mood and affect   Richard Hissong T. Russella Dar, MD 08/29/2023, 8:12 AM

## 2023-08-29 NOTE — Patient Instructions (Addendum)
Your provider has requested that you go to the basement level for lab work before leaving today. Press "B" on the elevator. The lab is located at the first door on the left as you exit the elevator.  You have been scheduled for a CT scan of the abdomen and pelvis at [facility], 1st floor Radiology. You are scheduled on 08/31/23 at 5:00pm. You should arrive at 2:45 pm to drink oral contrast.  Please follow the written instructions below on the day of your exam:   1) Do not eat anything after 1:00pm (4 hours prior to your test)   You may take any medications as prescribed with a small amount of water, if necessary. If you take any of the following medications: METFORMIN, GLUCOPHAGE, GLUCOVANCE, AVANDAMET, RIOMET, FORTAMET, ACTOPLUS MET, JANUMET, GLUMETZA or METAGLIP, you MAY be asked to HOLD this medication 48 hours AFTER the exam.   The purpose of you drinking the oral contrast is to aid in the visualization of your intestinal tract. The contrast solution may cause some diarrhea. Depending on your individual set of symptoms, you may also receive an intravenous injection of x-ray contrast/dye. Plan on being at [facility] for 45 minutes or longer, depending on the type of exam you are having performed.   If you have any questions regarding your exam or if you need to reschedule, you may call [facility] at [phone] between the hours of 8:00 am and 5:00 pm, Monday-Friday.   The [place] [facility] providers would like to encourage you to use [facility] to communicate with providers for non-urgent requests or questions.  Due to long hold times on the telephone, sending your provider a message by [facility] may be a faster and more efficient way to get a response.  Please allow 48 business hours for a response.  Please remember that this is for non-urgent requests.   Due to recent changes in healthcare laws, you may see the results of your imaging and laboratory studies on MyChart before your  provider has had a chance to review them.  We understand that in some cases there may be results that are confusing or concerning to you. Not all laboratory results come back in the same time frame and the provider may be waiting for multiple results in order to interpret others.  Please give [place] 48 hours in order for your provider to thoroughly review all the results before contacting the office for clarification of your results.   Thank you for choosing me and [facility].  Venita Lick. Pleas Koch., MD., Clementeen Graham

## 2023-08-30 ENCOUNTER — Other Ambulatory Visit: Payer: Self-pay

## 2023-08-30 DIAGNOSIS — E561 Deficiency of vitamin K: Secondary | ICD-10-CM

## 2023-08-30 LAB — TISSUE TRANSGLUTAMINASE, IGA: (tTG) Ab, IgA: <1 U/mL

## 2023-08-30 LAB — IGA: Immunoglobulin A: 264 mg/dL (ref 70–320)

## 2023-08-30 MED ORDER — VITAMIN K 100 MCG PO TABS: 100.0000 ug | ORAL_TABLET | Freq: Every day | ORAL | 0 refills | Status: DC

## 2023-08-31 ENCOUNTER — Ambulatory Visit (HOSPITAL_COMMUNITY): Payer: Medicare Other

## 2023-08-31 DIAGNOSIS — H43312 Vitreous membranes and strands, left eye: Secondary | ICD-10-CM | POA: Diagnosis not present

## 2023-08-31 DIAGNOSIS — Z961 Presence of intraocular lens: Secondary | ICD-10-CM | POA: Diagnosis not present

## 2023-09-01 ENCOUNTER — Other Ambulatory Visit: Payer: Self-pay | Admitting: *Deleted

## 2023-09-04 NOTE — Telephone Encounter (Signed)
Refill for vitamin k still has not been sent to [facility]. Please advise.

## 2023-09-05 ENCOUNTER — Telehealth: Payer: Self-pay | Admitting: Gastroenterology

## 2023-09-05 MED ORDER — VITAMIN K 100 MCG PO TABS: 100.0000 ug | ORAL_TABLET | Freq: Every day | ORAL | 0 refills | Status: DC

## 2023-09-05 NOTE — Telephone Encounter (Signed)
Inbound call from patient, states pharmacy did not receive prescription of Vitamin K. Would like it resent.

## 2023-09-05 NOTE — Telephone Encounter (Signed)
Prescription resent to patient's pharmacy. Attempted ton contact patient to confirm receipt and no answer and no voicemail.

## 2023-09-07 ENCOUNTER — Ambulatory Visit (HOSPITAL_COMMUNITY)
Admission: RE | Admit: 2023-09-07 | Discharge: 2023-09-07 | Disposition: A | Discharge status: Home / Self Care | Payer: Medicare Other | Source: Ambulatory Visit | Attending: Gastroenterology | Admitting: Gastroenterology

## 2023-09-07 DIAGNOSIS — R634 Abnormal weight loss: Secondary | ICD-10-CM | POA: Insufficient documentation

## 2023-09-07 DIAGNOSIS — N320 Bladder-neck obstruction: Secondary | ICD-10-CM | POA: Diagnosis not present

## 2023-09-07 MED ORDER — IOHEXOL 300 MG/ML  SOLN
30.0000 mL | Freq: Once | INTRAMUSCULAR | Status: AC | PRN
Start: 2023-09-07 — End: 2023-09-07
  Administered 2023-09-07: 30 mL via ORAL

## 2023-09-07 MED ORDER — IOHEXOL 300 MG/ML  SOLN
100.0000 mL | Freq: Once | INTRAMUSCULAR | Status: AC | PRN
  Administered 2023-09-07: 100 mL via INTRAVENOUS

## 2023-09-22 ENCOUNTER — Encounter (INDEPENDENT_AMBULATORY_CARE_PROVIDER_SITE_OTHER): Payer: Self-pay | Admitting: Otolaryngology

## 2023-09-22 ENCOUNTER — Ambulatory Visit (INDEPENDENT_AMBULATORY_CARE_PROVIDER_SITE_OTHER): Payer: Medicare Other | Admitting: Otolaryngology

## 2023-09-22 VITALS — BP 173/75 | HR 63 | Ht 71.0 in | Wt 165.0 lb

## 2023-09-22 DIAGNOSIS — R198 Other specified symptoms and signs involving the digestive system and abdomen: Secondary | ICD-10-CM

## 2023-09-22 DIAGNOSIS — R196 Halitosis: Secondary | ICD-10-CM | POA: Diagnosis not present

## 2023-09-22 DIAGNOSIS — R09A2 Foreign body sensation, throat: Secondary | ICD-10-CM

## 2023-09-22 DIAGNOSIS — K219 Gastro-esophageal reflux disease without esophagitis: Secondary | ICD-10-CM

## 2023-09-22 MED ORDER — FAMOTIDINE 20 MG PO TABS: 20.0000 mg | ORAL_TABLET | Freq: Two times a day (BID) | ORAL | 1 refills | Status: DC

## 2023-09-22 NOTE — Progress Notes (Signed)
 ENT CONSULT:  Reason for Consult: tonsil stones hx of tonsillectomy in 70's   HPI: Discussed the use of AI scribe software for clinical note transcription with the patient, who gave verbal consent to proceed.  History of Present Illness   The patient is a 78 yoM, with a history of atrial fibrillation, PE, on Xarelto , presents with a longstanding issue of sensation of tonsil stones and associated symptoms, despite having undergone a tonsillectomy in the 1970s. He reports a sensation of a foreign body in the throat, leading to frequent coughing and gagging episodes, occurring approximately three to four times a week. The patient also complains of chronic halitosis. He denies any issues with swallowing, pain in the throat, or sore throat.  The patient underwent an upper endoscopy three years ago, which revealed esophageal varices grade I and gastritis. He also had a CT scan of the abdomen, which was negative. He reports a gradual weight loss of about 20 pounds over four years, which has since stabilized. The patient quit drinking socially four years ago for health reasons.  The patient was diagnosed with a lung clot approximately a year ago, which has since resolved. He is currently on Xarelto  for this condition. He also takes Metoprolol  for atrial fibrillation. The patient denies any history of smoking or lung disease.       Records Reviewed:  GI office visit 08/29/23 A/P Weight loss, now stabilized - likely related to stopping alcohol History of alcoholic hepatitis, esophageal varices, thrombocytopenia - R/O cirrhosis Personal history of adenomatous colon polyps  Afib, history of PE on Xarelto   CT AP - R/O cirrhosis, portal hypertension  CBC, CMP, PT/INR, TSH, tTG, IgA No future colonoscopy recalls planned due to age Consider EGD to assess varices  REV with Dr. Suzann in 2 months  This is a 78 year old male with esophageal varices and a history of alcoholic hepatitis who relates weight  loss. He has chronic HFrEF, HTN, CAD, PE, COPD, thoracic aortic aneurysm, atrial fibrillation. He is accompanied by his wife. Alcoholic hepatitis in 2021 and he has remained abstinent from alcohol since then. He lost weight from 185 to 160-165 after stopping alcohol and his weight has remained stable in the 160-165 range since. Denies abdominal pain, constipation, diarrhea, change in stool caliber, melena, hematochezia, nausea, vomiting, dysphagia, reflux symptoms, chest pain.      Colonoscopy 11/2019 - Two 5 to 6 mm polyps in the transverse colon, removed with a cold snare. Resected and retrieved. - Mild diverticulosis in the left colon. - Internal hemorrhoids. - The examination was otherwise normal on direct and retroflexion views.   EGD 11/2019 - Grade I esophageal varices with no bleeding and no stigmata of recent bleeding. - Benign-appearing esophageal stenosis. - Erosive gastropathy with no bleeding and no stigmata of recent bleeding. Biopsied. - Erythematous mucosa in the gastric fundus and gastric body. Biopsied. - Normal duodenal bulb and second portion of the duodenum.  Cardiology Office visit 08/10/23 Richard Irwin is a 78 y.o. male chronic HFrEF, HTN, CAD, PE, esophageal varices, COPD, thoracic aortic aneurysm, atrial fibrillation who presents for follow up.  He has had an episode for syncope in 2021 in the setting of alcohol withdrawal.  During the admission he had an episode of NSVT. EF was normal at that time. He quit drinking and remains abstinent. He was found to have AF detected incidentally during a routine visit to the [place]. Repeat TTE showed an EF drop to 35-40%. Eliquis and beta blocker  were started. We was not particularly symptomatic. DCCV was performed, but AF recurred prior to his next follow-up. Patient is on Xarelto  for a CHADS2VASC score of 5. He is s/p afib ablation with Dr Nancey on 12/09/22.   He had recurrent atrial fib and had DCCV in April 2024.  He had an echo in  July 2024 with an EF of 40 - 45%.  .     Since last being seen he has felt well.  He has been active.  Has been doing yard work.  He does a little walking. The patient denies any new symptoms such as chest discomfort, neck or arm discomfort. There has been no new shortness of breath, PND or orthopnea. There have been no reported palpitations, presyncope or syncope.   Persistent Atrial Fibrillation (ICD10:  I48.19): He seems to be maintaining sinus rhythm.  He tolerates anticoagulation.  No change in therapy.   Secondary Hypercoagulable State (ICD10:  D68.69): He tolerates anticoagulation as above.   Chronic HFrEF:  EF was 35 - 40% in October 2023.   This was increased to 40 - 45% on echo in July.  I will titrate the Entresto  to 96/103 twice daily.  Other meds remain as listed and we can continue to titrate at follow-up visits.   Plan is to reassess his ejection fraction probably in another 6 months and if he has not improved after further med titration I will probably do an ischemia workup with a CT angiogram.   CAD: He does have a markedly elevated score but he said absolutely no symptoms despite being vigorously active.  We will continue with aggressive risk reduction.    HTN: The blood pressure being managed in the context of managing her heart failure.   Dyslipidemia: The 60 with an HDL of 68.  No change in therapy.     Aortic root dilatation:    This was 47 mm on echo.  I will follow-up with another CT in 1 year.      Past Medical History:  Diagnosis Date   Acute blood loss anemia    Anemia    Diverticulosis    Emphysema/COPD (HCC)    Esophageal varices (HCC)    H/O: GI bleed    Hypertension    Insomnia    Status post colonoscopy with polypectomy    with bleed   Thoracic ascending aortic aneurysm (HCC) 05/07/2013   Tubular adenoma     Past Surgical History:  Procedure Laterality Date   APPENDECTOMY     ATRIAL FIBRILLATION ABLATION N/A 12/09/2022   Procedure: ATRIAL  FIBRILLATION ABLATION;  Surgeon: Nancey Eulas BRAVO, MD;  Location: [facility] INVASIVE CV LAB;  Service: Cardiovascular;  Laterality: N/A;   CARDIOVERSION N/A 07/07/2022   Procedure: CARDIOVERSION;  Surgeon: Jeffrie Oneil BROCKS, MD;  Location: [facility] ENDOSCOPY;  Service: Cardiovascular;  Laterality: N/A;   CARDIOVERSION N/A 01/16/2023   Procedure: CARDIOVERSION;  Surgeon: Hobart Powell BRAVO, MD;  Location: [facility] INVASIVE CV LAB;  Service: Cardiovascular;  Laterality: N/A;   COLONOSCOPY WITH PROPOFOL  N/A 07/09/2014   Procedure: COLONOSCOPY WITH PROPOFOL ;  Surgeon: Norleen LOISE Kiang, MD;  Location: [facility] ENDOSCOPY;  Service: Endoscopy;  Laterality: N/A;   EXCISIONAL HEMORRHOIDECTOMY     HERNIA REPAIR     ROTATOR CUFF REPAIR      Family History  Problem Relation Age of Onset   Cirrhosis Father    Lung cancer Mother    Colon cancer Neg Hx    Pancreatic cancer Neg Hx  Rectal cancer Neg Hx    Stomach cancer Neg Hx     Social History:  reports that he has never smoked. He has never used smokeless tobacco. He reports that he does not currently use alcohol. He reports that he does not use drugs.  Allergies:  Allergies  Allergen Reactions   Lisinopril Cough    Medications: I have reviewed the patient's current medications.  The PMH, PSH, Medications, Allergies, and SH were reviewed and updated.  ROS: Constitutional: Negative for fever, weight loss and weight gain. Cardiovascular: Negative for chest pain and dyspnea on exertion. Respiratory: Is not experiencing shortness of breath at rest. Gastrointestinal: Negative for nausea and vomiting. Neurological: Negative for headaches. Psychiatric: The patient is not nervous/anxious  Blood pressure (!) 173/75, pulse 63, height 5' 11 (1.803 m), weight 165 lb (74.8 kg), SpO2 99%.  PHYSICAL EXAM:  Exam: General: Well-developed, well-nourished Communication and Voice: Clear pitch and clarity Respiratory Respiratory effort: Equal inspiration and expiration  without stridor Cardiovascular Peripheral Vascular: Warm extremities with equal color/perfusion Eyes: No nystagmus with equal extraocular motion bilaterally Neuro/Psych/Balance: Patient oriented to person, place, and time; Appropriate mood and affect; Gait is intact with no imbalance; Cranial nerves I-XII are intact Head and Face Inspection: Normocephalic and atraumatic without mass or lesion Palpation: Facial skeleton intact without bony stepoffs Salivary Glands: No mass or tenderness Facial Strength: Facial motility symmetric and full bilaterally ENT Pinna: External ear intact and fully developed External canal: Canal is patent with intact skin Tympanic Membrane: Clear and mobile External Nose: No scar or anatomic deformity Internal Nose: Septum is relatively straight on anterior rhinoscopy. Lips, Teeth, and gums: Mucosa and teeth intact and viable Oral cavity/oropharynx: No erythema or exudate, no lesions present and surgically absent tonsils without lesions or tonsil stones Neck Neck and Trachea: Midline trachea without mass or lesion Thyroid : No mass or nodularity Lymphatics: No lymphadenopathy  Procedure: none  Studies Reviewed : EGD 12/16/2019   Assessment/Plan: Encounter Diagnoses  Name Primary?   Halitosis    Episode of gagging Yes   Globus sensation    Chronic GERD     Assessment and Plan    Chronic Halitosis Chronic halitosis for several decades, likely multifactorial. Differential includes changes in oral bacteria, dry mouth, GERD irritation to his throat, and dental issues. No evidence of tonsil stones on examination and no residual tonsillar tissue. Recommended starting with PCP for further exploration. - Gargle with salt water  daily - Consider dietary changes to reduce inflammation  - Increase water  intake - Take probiotic supplements - Follow up with GI for further evaluation  Gagging Episodes Frequent gagging episodes (3-4 times/week) with sensation of  something stuck in the throat. No evidence of tonsil stones. Possible laryngospasm or throat sensitivity or chronic GERD LPR. Recommended trial of reflux medication and dietary changes. Trial of Reflux Gourmet. - Prescribed famotidine  (Pepcid ) 20 mg BID - Consider dietary changes to reduce reflux - Use Reflux Gourmet after meals - Follow up with PCP if symptoms persist  Esophageal Varices Esophageal varices noted on upper endoscopy three years ago. No current symptoms. - Follow up with GI   Atrial Fibrillation Atrial fibrillation managed with metoprolol . Regular follow-ups with cardiology. - Continue metoprolol  as prescribed - Follow up with cardiologist regularly  Pulmonary Embolism Pulmonary embolism one year ago, currently on Xarelto . Clot resolved.  - Continue Xarelto  as prescribed - Follow up with cardiologist next week  General Health Maintenance Patient has made lifestyle changes such as quitting alcohol four years  ago. No history of smoking. - Maintain current healthy lifestyle - Monitor weight and general health  Follow-up - Discuss halitosis with GI and PCP during next visit.  - return if new sx, or persistent sx, will consider flexible laryngoscopy when he returns    Thank you for allowing me to participate in the care of this patient. Please do not hesitate to contact me with any questions or concerns.   Elena Larry, MD Otolaryngology [facility] Phone: [phone] Fax: [phone]    09/22/2023, 9:25 AM

## 2023-09-22 NOTE — Patient Instructions (Addendum)
 Causes of Tonsil Stones Tonsil stones, or tonsilloliths, are calcified deposits that form within the crevices of the tonsils. Their formation is a multifactorial process influenced by anatomical, microbial, and lifestyle factors. This review examines the primary causes and contributing factors to tonsil stone development based on current scientific understanding. 1. Anatomical Factors The structure of the tonsils plays a significant role in the formation of tonsil stones. The tonsils contain crypts--small pits and folds--that can trap debris, including food particles, dead cells, and mucus. Individuals with deeper crypts are more predisposed to tonsil stone formation due to the increased surface area available for debris accumulation. Tonsillar Hypertrophy: Enlarged tonsils may harbor  more crypts, enhancing the risk of debris entrapment. Genetic Predisposition: Some studies suggest that inherited anatomical variations can influence the size and depth of tonsillar crypts. 2. Microbial Activity The role of bacteria and other microorganisms is central to the formation of tonsil stones. Anaerobic bacteria, which thrive in the low-oxygen environment of the tonsillar crypts, break down trapped organic material, leading to the production of sulfur compounds. These compounds not only contribute to the characteristic foul odor of tonsil stones but also promote the calcification process. Biofilm Formation: Microbial communities in tonsillar crypts can form biofilms, which provide a structured environment for bacteria and facilitate mineral deposition. Infection History: Recurrent tonsillitis or other infections may disrupt the natural tonsillar environment, making it more conducive to stone formation. 3. Dietary and Lifestyle Factors Diet and lifestyle habits significantly influence the likelihood of tonsil stone formation. Dietary Choices: Diets high in dairy products and processed foods may increase mucus  production and leave residue that contributes to stone formation. Hydration Levels: Insufficient fluid intake can lead to dry mouth, reducing saliva's natural cleaning effect and increasing debris accumulation. Oral Hygiene: Poor oral hygiene can exacerbate the presence of bacteria and debris in the oral cavity, increasing the risk of tonsil stones. 4. Salivary Composition and Flow Saliva plays a crucial role in maintaining oral health by washing away debris and neutralizing acids. Variations in salivary composition and flow can influence the formation of tonsil stones. Xerostomia (Dry Mouth): Conditions or medications that reduce salivary flow can exacerbate debris accumulation in the tonsillar crypts. Calcium  Content: Elevated calcium  levels in saliva may predispose individuals to the mineralization of trapped debris. 5. Systemic Health and Immune Response Systemic health factors and immune responses can also contribute to tonsil stone development. Chronic Inflammation: Persistent inflammation in the tonsils may alter the local environment, making it more prone to stone formation. Immune Deficiencies: Compromised immune systems may fail to regulate bacterial populations effectively, increasing the risk of stone formation.  - Take Reflux Gourmet (natural supplement available on Amazon) to help with symptoms of chronic throat irritation

## 2023-09-28 ENCOUNTER — Ambulatory Visit: Payer: Medicare Other | Admitting: Physician Assistant

## 2023-10-04 ENCOUNTER — Telehealth: Payer: Self-pay | Admitting: Cardiology

## 2023-10-04 MED ORDER — METOPROLOL SUCCINATE ER 25 MG PO TB24: 25.0000 mg | ORAL_TABLET | Freq: Every day | ORAL | 3 refills | Status: DC

## 2023-10-04 MED ORDER — METOPROLOL SUCCINATE ER 50 MG PO TB24: 50.0000 mg | ORAL_TABLET | Freq: Every day | ORAL | 3 refills | Status: AC

## 2023-10-04 NOTE — Telephone Encounter (Signed)
*  STAT* If patient is at the pharmacy, call can be transferred to refill team.   1. Which medications need to be refilled? (please list name of each medication and dose if known) metoprolol  succinate (TOPROL -XL) 50 MG 24 hr tablet (Expired)   2. Which pharmacy/location (including street and city if local pharmacy) is medication to be sent to?  [id number] [place], [place] - [address]    3. Do they need a 30 day or 90 day supply? 90

## 2023-10-04 NOTE — Telephone Encounter (Signed)
 Pt's medications were sent to pt's pharmacy as requested. Confirmation received.

## 2023-10-08 NOTE — Progress Notes (Deleted)
Cardiology Office Note:   Date:  10/08/2023  ID:  Richard Irwin, DOB 1946-08-24, MRN [id number] PCP: Daisy Floro, MD  [facility] Providers Cardiologist:  Rollene Rotunda, MD Electrophysiologist:  Maurice Small, MD {  History of Present Illness:   Richard Irwin is a 78 y.o. male male chronic HFrEF, HTN, CAD, PE, esophageal varices, COPD, thoracic aortic aneurysm, atrial fibrillation who presents for follow up.  He has had an episode for syncope in 2021 in the setting of alcohol withdrawal.  During the admission he had an episode of NSVT. EF was normal at that time. He quit drinking and remains abstinent. He was found to have AF detected incidentally during a routine visit to the [place]. Repeat TTE showed an EF drop to 35-40%. Eliquis and beta blocker were started. We was not particularly symptomatic. DCCV was performed, but AF recurred prior to his next follow-up. Patient is on Xarelto for a CHADS2VASC score of 5. He is s/p afib ablation with Dr Nelly Laurence on 12/09/22.   He had recurrent atrial fib and had DCCV in April 2024.  He had an echo in July 2024 with an EF of 40 - 45%.   He has elevated coronary calcium of 8393 distributed in all 3 vessels.  This was 99th percentile.  ***    Since last being seen ***   ***   he has felt well.  He has been active.  Has been doing yard work.  He does a little walking. The patient denies any new symptoms such as chest discomfort, neck or arm discomfort. There has been no new shortness of breath, PND or orthopnea. There have been no reported palpitations, presyncope or syncope.   ROS: ***  Studies Reviewed:    EKG:       ***  Risk Assessment/Calculations:   {Does this patient have ATRIAL FIBRILLATION?:[phone]} No BP recorded.  {Refresh Note OR Click here to enter BP  :1}***        Physical Exam:   VS:  There were no vitals taken for this visit.   Wt Readings from Last 3 Encounters:  09/22/23 165 lb (74.8 kg)  08/29/23 165 lb  (74.8 kg)  08/10/23 162 lb 12.8 oz (73.8 kg)     GEN: Well nourished, well developed in no acute distress NECK: No JVD; No carotid bruits CARDIAC: ***RR, *** murmurs, rubs, gallops RESPIRATORY:  Clear to auscultation without rales, wheezing or rhonchi  ABDOMEN: Soft, non-tender, non-distended EXTREMITIES:  No edema; No deformity   ASSESSMENT AND PLAN:   Persistent Atrial Fibrillation (ICD10:  I48.19): He seems to be maintaining sinus rhythm.  ***  He tolerates anticoagulation.  No change in therapy.   Secondary Hypercoagulable State (ICD10:  D68.69): ***  He tolerates anticoagulation as above.   Chronic HFrEF:  EF was 35 - 40% in October 2023.   This was increased to 40 - 45% on echo in July 2024.   ***   I will titrate the Entresto to 96/103 twice daily.  Other meds remain as listed and we can continue to titrate at follow-up visits.   Plan is to reassess his ejection fraction probably in another 6 months and if he has not improved after further med titration I will probably do an ischemia workup with a CT angiogram.   CAD:   Elevated coronary calcium.  ***    He does have a markedly elevated score but he said absolutely no symptoms despite being vigorously active.  We will continue with aggressive risk reduction.    HTN: The blood pressure is ***   being managed in the context of managing her heart failure.   Dyslipidemia: The LDL was ***   60 with an HDL of 68.  No change in therapy.     Aortic root dilatation:    This was 45 mm on CT in March 2024.  ***  on echo.  I will follow-up with another CT in 1 year.       Follow up ***  Signed, Rollene Rotunda, MD

## 2023-10-11 ENCOUNTER — Encounter: Payer: Self-pay | Admitting: *Deleted

## 2023-10-11 ENCOUNTER — Ambulatory Visit: Payer: Medicare Other | Admitting: Cardiology

## 2023-10-11 DIAGNOSIS — I5022 Chronic systolic (congestive) heart failure: Secondary | ICD-10-CM

## 2023-10-11 DIAGNOSIS — I77810 Thoracic aortic ectasia: Secondary | ICD-10-CM

## 2023-10-11 DIAGNOSIS — I10 Essential (primary) hypertension: Secondary | ICD-10-CM

## 2023-10-11 DIAGNOSIS — D6869 Other thrombophilia: Secondary | ICD-10-CM

## 2023-10-11 DIAGNOSIS — I4819 Other persistent atrial fibrillation: Secondary | ICD-10-CM

## 2023-10-11 DIAGNOSIS — I2510 Atherosclerotic heart disease of native coronary artery without angina pectoris: Secondary | ICD-10-CM

## 2023-10-11 DIAGNOSIS — E785 Hyperlipidemia, unspecified: Secondary | ICD-10-CM

## 2023-10-11 NOTE — Telephone Encounter (Signed)
Spoke with patient regarding appointment cancellation due to inclement weather. He is aware that a scheduler will contact him later today to reschedule appointment.

## 2023-10-13 ENCOUNTER — Other Ambulatory Visit (INDEPENDENT_AMBULATORY_CARE_PROVIDER_SITE_OTHER): Payer: Self-pay | Admitting: Otolaryngology

## 2023-11-30 ENCOUNTER — Ambulatory Visit: Payer: Medicare Other | Admitting: Cardiology

## 2023-11-30 NOTE — Progress Notes (Unsigned)
 Cardiology Office Note:   Date:  12/01/2023  ID:  Richard Irwin, DOB [birth year], MRN [id number] PCP: Daisy Floro, MD  [facility] Providers Cardiologist:  Rollene Rotunda, MD Electrophysiologist:  Maurice Small, MD {  History of Present Illness:   Richard Irwin is a 78 y.o. male with chronic HFrEF, HTN, CAD, PE, esophageal varices, COPD, thoracic aortic aneurysm, atrial fibrillation who presents for follow up.  He has had an episode for syncope in 2021 in the setting of alcohol withdrawal.  During the admission he had an episode of NSVT. EF was normal at that time. He quit drinking and remains abstinent. He was found to have AF detected incidentally during a routine visit to the [place]. Repeat TTE showed an EF drop to 35-40%. Eliquis and beta blocker were started. He was not particularly symptomatic. DCCV was performed, but AF recurred prior to his next follow-up. Patient is on Xarelto for a CHADS2VASC score of 5. He is s/p afib ablation with Dr Nelly Laurence on 12/09/22.   He had recurrent atrial fib and had DCCV in April 2024.  He had an echo in July 2024 with an EF of 40 - 45%.  .     Since last being seen he has done well.  He does not think he is had any symptomatic paroxysms.The patient denies any new symptoms such as chest discomfort, neck or arm discomfort. There has been no new shortness of breath, PND or orthopnea. There have been no reported palpitations, presyncope or syncope.   ROS: As stated in the HPI and negative for all other systems.  Studies Reviewed:    EKG:   Normal sinus rhythm with sinus arrhythmia, interventricular conduction delay, April 17, 2023.    Risk Assessment/Calculations:    CHA2DS2-VASc Score = 5   This indicates a 7.2% annual risk of stroke. The patient's score is based upon: CHF History: 1 HTN History: 1 Diabetes History: 0 Stroke History: 0 Vascular Disease History: 1 Age Score: 2 Gender Score: 0    Physical Exam:   VS:  BP 126/72    Pulse 70   Ht 5\' 11"  (1.803 m)   Wt 166 lb (75.3 kg)   SpO2 97%   BMI 23.15 kg/m    Wt Readings from Last 3 Encounters:  12/01/23 166 lb (75.3 kg)  09/22/23 165 lb (74.8 kg)  08/29/23 165 lb (74.8 kg)     GEN: Well nourished, well developed in no acute distress NECK: No JVD; No carotid bruits CARDIAC:  RR,  RRR, no murmurs, rubs, gallops RESPIRATORY:  Clear to auscultation without rales, wheezing or rhonchi  ABDOMEN: Soft, non-tender, non-distended EXTREMITIES:  No edema; No deformity   ASSESSMENT AND PLAN:   Persistent Atrial Fibrillation: He tolerates anticoagulation.  He said both of pulmonary embolism and recurrent fibrillation post ablation although he seems to be in sinus now.  He and I had a long conversation about continuing anticoagulation and he prefers this.    Secondary Hypercoagulable State: Has had no bleeding.  He is on the appropriate dose and up to date with labs.    Chronic HFrEF:  EF was 35 - 40% in October 2023.   This was increased to 40 - 45% on echo in July 2024.  I will repeat a cardiogram.  If the EF is still reduced now that his rhythm is sinus and his meds have been titrated up probably consider an ischemia evaluation with a perfusion study.   CAD:  The patient has no new sypmtoms.  No further cardiovascular testing is indicated.  We will continue with aggressive risk reduction and meds as listed.   HTN: The blood pressure is at target and being managed in the context of treating his heart failure.  Dyslipidemia: The LDL was 60 with an HDL of 68.  No change in therapy.   Aortic root dilatation:    This was 44 mm on echo in March 2024.  He will get CT chest angiogram to look at his aorta size.       Follow up with me in one year.   Signed, Rollene Rotunda, MD

## 2023-12-01 ENCOUNTER — Encounter: Payer: Self-pay | Admitting: Cardiology

## 2023-12-01 ENCOUNTER — Ambulatory Visit: Attending: Cardiology | Admitting: Cardiology

## 2023-12-01 VITALS — BP 126/72 | HR 70 | Ht 71.0 in | Wt 166.0 lb

## 2023-12-01 DIAGNOSIS — I5022 Chronic systolic (congestive) heart failure: Secondary | ICD-10-CM

## 2023-12-01 DIAGNOSIS — I10 Essential (primary) hypertension: Secondary | ICD-10-CM

## 2023-12-01 DIAGNOSIS — I2510 Atherosclerotic heart disease of native coronary artery without angina pectoris: Secondary | ICD-10-CM | POA: Diagnosis not present

## 2023-12-01 DIAGNOSIS — E785 Hyperlipidemia, unspecified: Secondary | ICD-10-CM

## 2023-12-01 DIAGNOSIS — I77810 Thoracic aortic ectasia: Secondary | ICD-10-CM | POA: Diagnosis not present

## 2023-12-01 LAB — BASIC METABOLIC PANEL
BUN/Creatinine Ratio: 11 (ref 10–24)
BUN: 10 mg/dL (ref 8–27)
CO2: 25 mmol/L (ref 20–29)
Calcium: 9.6 mg/dL (ref 8.6–10.2)
Chloride: 100 mmol/L (ref 96–106)
Creatinine, Ser: 0.95 mg/dL (ref 0.76–1.27)
Glucose: 86 mg/dL (ref 70–99)
Potassium: 5 mmol/L (ref 3.5–5.2)
Sodium: 137 mmol/L (ref 134–144)
eGFR: 82 mL/min/{1.73_m2} (ref >59)

## 2023-12-01 NOTE — Patient Instructions (Addendum)
 Medication Instructions:  No changes.  *If you need a refill on your cardiac medications before your next appointment, please call your pharmacy*   Testing/Procedures:   Your physician has requested that you have an echocardiogram. Echocardiography is a painless test that uses sound waves to create images of your heart. It provides your doctor with information about the size and shape of your heart and how well your heart's chambers and valves are working. This procedure takes approximately one hour. There are no restrictions for this procedure. Please do NOT wear cologne, perfume, aftershave, or lotions (deodorant is allowed). Please arrive 15 minutes prior to your appointment time. [address].   Please note: We ask at that you not bring children with you during ultrasound (echo/ vascular) testing. Due to room size and safety concerns, children are not allowed in the ultrasound rooms during exams. Our front office staff cannot provide observation of children in our lobby area while testing is being conducted. An adult accompanying a patient to their appointment will only be allowed in the ultrasound room at the discretion of the ultrasound technician under special circumstances. We apologize for any inconvenience.   Non-Cardiac CT scanning, (CAT scanning), is a noninvasive, special x-ray that produces cross-sectional images of the body using x-rays and a computer. CT scans help physicians diagnose and treat medical conditions. For some CT exams, a contrast material is used to enhance visibility in the area of the body being studied. CT scans provide greater clarity and reveal more details than regular x-ray exams.    Follow-Up: At [facility], you and your health needs are our priority.  As part of our continuing mission to provide you with exceptional heart care, we have created designated Provider Care Teams.  These Care Teams include your primary Cardiologist (physician) and  Advanced Practice Providers (APPs -  Physician Assistants and Nurse Practitioners) who all work together to provide you with the care you need, when you need it.  We recommend signing up for the patient portal called "MyChart".  Sign up information is provided on this After Visit Summary.  MyChart is used to connect with patients for Virtual Visits (Telemedicine).  Patients are able to view lab/test results, encounter notes, upcoming appointments, etc.  Non-urgent messages can be sent to your provider as well.   To learn more about what you can do with MyChart, go to [email or web address].    Your next appointment:   1 year(s)  Provider:   Rollene Rotunda, MD     Other Instructions

## 2023-12-04 ENCOUNTER — Encounter: Payer: Self-pay | Admitting: *Deleted

## 2023-12-08 ENCOUNTER — Ambulatory Visit (HOSPITAL_COMMUNITY)
Admission: RE | Admit: 2023-12-08 | Discharge: 2023-12-08 | Disposition: A | Discharge status: Home / Self Care | Source: Ambulatory Visit | Attending: Cardiology | Admitting: Cardiology

## 2023-12-08 DIAGNOSIS — R9389 Abnormal findings on diagnostic imaging of other specified body structures: Secondary | ICD-10-CM | POA: Diagnosis not present

## 2023-12-08 DIAGNOSIS — I77810 Thoracic aortic ectasia: Secondary | ICD-10-CM | POA: Insufficient documentation

## 2023-12-08 DIAGNOSIS — I719 Aortic aneurysm of unspecified site, without rupture: Secondary | ICD-10-CM | POA: Diagnosis not present

## 2023-12-08 DIAGNOSIS — I2510 Atherosclerotic heart disease of native coronary artery without angina pectoris: Secondary | ICD-10-CM | POA: Diagnosis not present

## 2023-12-08 DIAGNOSIS — I700 Atherosclerosis of aorta: Secondary | ICD-10-CM | POA: Diagnosis not present

## 2023-12-08 MED ORDER — SODIUM CHLORIDE (PF) 0.9 % IJ SOLN
INTRAMUSCULAR | Status: AC
  Filled 2023-12-08: qty 50

## 2023-12-08 MED ORDER — IOHEXOL 350 MG/ML SOLN
100.0000 mL | Freq: Once | INTRAVENOUS | Status: AC | PRN
  Administered 2023-12-08: 100 mL via INTRAVENOUS

## 2023-12-13 DIAGNOSIS — N3281 Overactive bladder: Secondary | ICD-10-CM | POA: Diagnosis not present

## 2023-12-13 DIAGNOSIS — R350 Frequency of micturition: Secondary | ICD-10-CM | POA: Diagnosis not present

## 2023-12-21 DIAGNOSIS — M353 Polymyalgia rheumatica: Secondary | ICD-10-CM | POA: Diagnosis not present

## 2023-12-21 DIAGNOSIS — M810 Age-related osteoporosis without current pathological fracture: Secondary | ICD-10-CM | POA: Diagnosis not present

## 2023-12-21 DIAGNOSIS — D696 Thrombocytopenia, unspecified: Secondary | ICD-10-CM | POA: Diagnosis not present

## 2023-12-21 DIAGNOSIS — M1990 Unspecified osteoarthritis, unspecified site: Secondary | ICD-10-CM | POA: Diagnosis not present

## 2023-12-21 DIAGNOSIS — M549 Dorsalgia, unspecified: Secondary | ICD-10-CM | POA: Diagnosis not present

## 2023-12-26 ENCOUNTER — Ambulatory Visit (HOSPITAL_COMMUNITY): Attending: Cardiology

## 2023-12-26 DIAGNOSIS — I10 Essential (primary) hypertension: Secondary | ICD-10-CM | POA: Insufficient documentation

## 2023-12-26 DIAGNOSIS — I77810 Thoracic aortic ectasia: Secondary | ICD-10-CM | POA: Diagnosis not present

## 2023-12-26 DIAGNOSIS — J439 Emphysema, unspecified: Secondary | ICD-10-CM | POA: Diagnosis not present

## 2023-12-26 DIAGNOSIS — I080 Rheumatic disorders of both mitral and aortic valves: Secondary | ICD-10-CM | POA: Diagnosis not present

## 2023-12-26 LAB — ECHOCARDIOGRAM COMPLETE
AR max vel: 4.08 cm2
AV Area VTI: 3.21 cm2
AV Area mean vel: 3.64 cm2
AV Mean grad: 1.3 mmHg
AV Peak grad: 2.3 mmHg
Ao pk vel: 0.75 m/s
Area-P 1/2: 2.41 cm2
S' Lateral: 3.5 cm

## 2023-12-27 ENCOUNTER — Other Ambulatory Visit (HOSPITAL_COMMUNITY): Payer: Self-pay | Admitting: *Deleted

## 2023-12-27 ENCOUNTER — Encounter: Payer: Self-pay | Admitting: *Deleted

## 2023-12-27 DIAGNOSIS — I77810 Thoracic aortic ectasia: Secondary | ICD-10-CM

## 2024-01-01 ENCOUNTER — Encounter: Payer: Self-pay | Admitting: *Deleted

## 2024-01-01 DIAGNOSIS — R931 Abnormal findings on diagnostic imaging of heart and coronary circulation: Secondary | ICD-10-CM

## 2024-01-01 DIAGNOSIS — I5022 Chronic systolic (congestive) heart failure: Secondary | ICD-10-CM

## 2024-01-01 NOTE — Telephone Encounter (Signed)
 Order placed for [employer] pended  FWD to Dr. Lavonne Prairie

## 2024-01-08 ENCOUNTER — Encounter (HOSPITAL_COMMUNITY): Payer: Self-pay

## 2024-01-09 ENCOUNTER — Ambulatory Visit (HOSPITAL_COMMUNITY): Attending: Cardiology

## 2024-01-09 DIAGNOSIS — R931 Abnormal findings on diagnostic imaging of heart and coronary circulation: Secondary | ICD-10-CM | POA: Insufficient documentation

## 2024-01-09 DIAGNOSIS — I447 Left bundle-branch block, unspecified: Secondary | ICD-10-CM | POA: Insufficient documentation

## 2024-01-09 DIAGNOSIS — R9439 Abnormal result of other cardiovascular function study: Secondary | ICD-10-CM | POA: Diagnosis not present

## 2024-01-09 DIAGNOSIS — I444 Left anterior fascicular block: Secondary | ICD-10-CM | POA: Insufficient documentation

## 2024-01-09 LAB — MYOCARDIAL PERFUSION IMAGING
Base ST Depression (mm): 0 mm
LV dias vol: 91 mL (ref 62–150)
LV sys vol: 50 mL
Nuc Stress EF: 44 %
Peak HR: 86 {beats}/min
Rest HR: 79 {beats}/min
Rest Nuclear Isotope Dose: 10.6 mCi
SDS: 4
SRS: 0
SSS: 4
ST Depression (mm): 0 mm
Stress Nuclear Isotope Dose: 32.4 mCi
TID: 1.04

## 2024-01-09 MED ORDER — TECHNETIUM TC 99M TETROFOSMIN IV KIT
10.6000 | PACK | Freq: Once | INTRAVENOUS | Status: AC | PRN
Start: 2024-01-09 — End: 2024-01-09
  Administered 2024-01-09: 10.6 via INTRAVENOUS

## 2024-01-09 MED ORDER — REGADENOSON 0.4 MG/5ML IV SOLN
0.4000 mg | Freq: Once | INTRAVENOUS | Status: AC
  Administered 2024-01-09: 0.4 mg via INTRAVENOUS

## 2024-01-09 MED ORDER — TECHNETIUM TC 99M TETROFOSMIN IV KIT
32.4000 | PACK | Freq: Once | INTRAVENOUS | Status: AC | PRN
  Administered 2024-01-09: 32.4 via INTRAVENOUS

## 2024-01-17 ENCOUNTER — Ambulatory Visit: Payer: Medicare Other | Admitting: Cardiology

## 2024-02-01 DIAGNOSIS — M79671 Pain in right foot: Secondary | ICD-10-CM | POA: Diagnosis not present

## 2024-02-01 DIAGNOSIS — M79672 Pain in left foot: Secondary | ICD-10-CM | POA: Diagnosis not present

## 2024-02-01 DIAGNOSIS — Z6824 Body mass index (BMI) 24.0-24.9, adult: Secondary | ICD-10-CM | POA: Diagnosis not present

## 2024-02-28 ENCOUNTER — Other Ambulatory Visit: Payer: Self-pay | Admitting: Urology

## 2024-02-28 ENCOUNTER — Telehealth: Payer: Self-pay

## 2024-02-28 ENCOUNTER — Telehealth: Payer: Self-pay | Admitting: Cardiology

## 2024-02-28 NOTE — Telephone Encounter (Signed)
  Patient Consent for Virtual Visit        Richard Irwin has provided verbal consent on 02/28/2024 for a virtual visit (video or telephone).   CONSENT FOR VIRTUAL VISIT FOR:  Richard Irwin  By participating in this virtual visit I agree to the following:  I hereby voluntarily request, consent and authorize [facility] and its employed or contracted physicians, physician assistants, nurse practitioners or other licensed health care professionals (the Practitioner), to provide me with telemedicine health care services (the "Services) as deemed necessary by the treating Practitioner. I acknowledge and consent to receive the Services by the Practitioner via telemedicine. I understand that the telemedicine visit will involve communicating with the Practitioner through live audiovisual communication technology and the disclosure of certain medical information by electronic transmission. I acknowledge that I have been given the opportunity to request an in-person assessment or other available alternative prior to the telemedicine visit and am voluntarily participating in the telemedicine visit.  I understand that I have the right to withhold or withdraw my consent to the use of telemedicine in the course of my care at any time, without affecting my right to future care or treatment, and that the Practitioner or I may terminate the telemedicine visit at any time. I understand that I have the right to inspect all information obtained and/or recorded in the course of the telemedicine visit and may receive copies of available information for a reasonable fee.  I understand that some of the potential risks of receiving the Services via telemedicine include:  Delay or interruption in medical evaluation due to technological equipment failure or disruption; Information transmitted may not be sufficient (e.g. poor resolution of images) to allow for appropriate medical decision making by the  Practitioner; and/or  In rare instances, security protocols could fail, causing a breach of personal health information.  Furthermore, I acknowledge that it is my responsibility to provide information about my medical history, conditions and care that is complete and accurate to the best of my ability. I acknowledge that Practitioner's advice, recommendations, and/or decision may be based on factors not within their control, such as incomplete or inaccurate data provided by me or distortions of diagnostic images or specimens that may result from electronic transmissions. I understand that the practice of medicine is not an exact science and that Practitioner makes no warranties or guarantees regarding treatment outcomes. I acknowledge that a copy of this consent can be made available to me via my patient portal [facility] MyChart), or I can request a printed copy by calling the office of [facility].    I understand that my insurance will be billed for this visit.   I have read or had this consent read to me. I understand the contents of this consent, which adequately explains the benefits and risks of the Services being provided via telemedicine.  I have been provided ample opportunity to ask questions regarding this consent and the Services and have had my questions answered to my satisfaction. I give my informed consent for the services to be provided through the use of telemedicine in my medical care

## 2024-02-28 NOTE — Telephone Encounter (Signed)
 Preop clearance now scheduled, med rec and consent done

## 2024-02-28 NOTE — Telephone Encounter (Signed)
     Pre-operative Risk Assessment    Patient Name: Richard Irwin  DOB: 02-11-1946 MRN: [id number]   Date of last office visit: 12/01/23 Date of next office visit: 11/2024 recall   Request for Surgical Clearance    Procedure:  insertion of inflatable penile prosthesis  Date of Surgery:  Clearance 03/12/24                                Surgeon:  Dr. Aleck Hurdle Group or Practice Name:  [facility]  Phone number:  [phone] Fax number:  [phone]   Type of Clearance Requested:   - Medical  - Pharmacy:  Hold Rivaroxaban  (Xarelto ) 3 days   Type of Anesthesia:  General    Additional requests/questions:    Signed, Elizbeth Gum   02/28/2024, 2:06 PM

## 2024-02-29 NOTE — Telephone Encounter (Signed)
 Patient with diagnosis of atrial fibrillation and PE, on Xarelto  for anticoagulation.    Procedure:  insertion of inflatable penile prosthesis   Date of Surgery:  Clearance 03/12/24   CHA2DS2-VASc Score = 5   This indicates a 7.2% annual risk of stroke. The patient's score is based upon: CHF History: 1 HTN History: 1 Diabetes History: 0 Stroke History: 0 Vascular Disease History: 1 Age Score: 2 Gender Score: 0    CrCl 69 Platelet count 136  Patient has not had an Afib/aflutter ablation within the last 3 months or DCCV within the last 30 days  Had unprovoked PE 09/2022 while compliant on Eliquis, now on Xarelto .   Also has history of GIB about 10 years ago. Asking for 3 days hold.    Will review with primary cardiologist if would prefer to bridge for 3 day hold.    **This guidance is not considered finalized until pre-operative APP has relayed final recommendations.**

## 2024-03-01 ENCOUNTER — Encounter: Payer: Self-pay | Admitting: Nurse Practitioner

## 2024-03-01 ENCOUNTER — Ambulatory Visit: Attending: Cardiovascular Disease | Admitting: Nurse Practitioner

## 2024-03-01 DIAGNOSIS — Z01810 Encounter for preprocedural cardiovascular examination: Secondary | ICD-10-CM

## 2024-03-01 MED ORDER — ENOXAPARIN SODIUM 80 MG/0.8ML IJ SOSY: 80.0000 mg | PREFILLED_SYRINGE | Freq: Two times a day (BID) | INTRAMUSCULAR | 0 refills | Status: AC

## 2024-03-01 NOTE — Telephone Encounter (Signed)
 Xarelto  to Lovenox  bridge for procedure on 03/12/2024 Take Xarelto  daily like normal through 03/08/2024 03/09/2024 - inject Lovenox  80mg  subcutaneously in the fatty tissue of the abdomen in the evening, no Xarelto  03/10/2024 - inject Lovenox  80mg  twice a day (in the morning and in the evening approx. 12 hrs. apart), no Xarelto  03/11/2024 - inject Lovenox  80 mg in the morning, NO evening dose, no Xarelto   03/12/2024 - Procedure date, NO Lovenox , NO Xarelto   03/13/2024- resume Xarelto  as advised by MD  Spoke to patient, instruction shared with him over the phone and we will send it via MyChart too. Patient states he will read the administration steps that come with Lovenox  if need training will call back  [phone] for in person training    Pt was appreciative for the phone call

## 2024-03-01 NOTE — Progress Notes (Signed)
 Virtual Visit via Telephone Note   Because of Richard Irwin co-morbid illnesses, he is at least at moderate risk for complications without adequate follow up.  This format is felt to be most appropriate for this patient at this time.  Due to technical limitations with video connection (technology), today's appointment will be conducted as an audio only telehealth visit, and Richard Irwin verbally agreed to proceed in this manner.   All issues noted in this document were discussed and addressed.  No physical exam could be performed with this format.  Evaluation Performed:  Preoperative cardiovascular risk assessment _____________   Date:  03/01/2024   Patient ID:  Richard Irwin, DOB 1945-11-27, MRN [id number] Patient Location:  Home Provider location:   Office  Primary Care Provider:  Jimmey Mould, MD Primary Cardiologist:  Eilleen Grates, MD  Chief Complaint / Patient Profile   78 y.o. y/o male with a h/o chronic HFrEF, HTN, CAD, PE, esophageal varices, COPD, thoracic aortic aneurysm, atrial fibrillation s/p ablation 11/2022 with ERAF, syncope in the setting of Etoh withdrawal with episode of NSVT during admission who is pending insertion of inflatable penile prosthesis with Dr. Jarvis Mesa on 03/12/24 and presents today for telephonic preoperative cardiovascular risk assessment.  History of Present Illness    Richard Irwin is a 78 y.o. male who presents via audio/video conferencing for a telehealth visit today.  Pt was last seen in cardiology clinic on 12/01/23 by Dr. Lavonne Prairie.  At that time Richard Irwin was doing well.  The patient is now pending procedure as outlined above. Since his last visit, he denies chest pain, shortness of breath, lower extremity edema, fatigue, palpitations, melena, hematuria, hemoptysis, diaphoresis, weakness, presyncope, syncope, orthopnea, and PND. He is active at home and is able to achieve > 4 METS activity without concerning cardiac  symptoms.   Past Medical History    Past Medical History:  Diagnosis Date   Acute blood loss anemia    Anemia    Diverticulosis    Emphysema/COPD (HCC)    Esophageal varices (HCC)    H/O: GI bleed    Hypertension    Insomnia    Status post colonoscopy with polypectomy    with bleed   Thoracic ascending aortic aneurysm (HCC) 05/07/2013   Tubular adenoma    Past Surgical History:  Procedure Laterality Date   APPENDECTOMY     ATRIAL FIBRILLATION ABLATION N/A 12/09/2022   Procedure: ATRIAL FIBRILLATION ABLATION;  Surgeon: Efraim Grange, MD;  Location: [facility] INVASIVE CV LAB;  Service: Cardiovascular;  Laterality: N/A;   CARDIOVERSION N/A 07/07/2022   Procedure: CARDIOVERSION;  Surgeon: Hugh Madura, MD;  Location: [facility] ENDOSCOPY;  Service: Cardiovascular;  Laterality: N/A;   CARDIOVERSION N/A 01/16/2023   Procedure: CARDIOVERSION;  Surgeon: Sonny Dust, MD;  Location: [facility] INVASIVE CV LAB;  Service: Cardiovascular;  Laterality: N/A;   COLONOSCOPY WITH PROPOFOL  N/A 07/09/2014   Procedure: COLONOSCOPY WITH PROPOFOL ;  Surgeon: Tobin Forts, MD;  Location: [facility] ENDOSCOPY;  Service: Endoscopy;  Laterality: N/A;   EXCISIONAL HEMORRHOIDECTOMY     HERNIA REPAIR     ROTATOR CUFF REPAIR      Allergies  Allergies  Allergen Reactions   Lisinopril Cough    Home Medications    Prior to Admission medications   Medication Sig Start Date End Date Taking? Authorizing Provider  alendronate (FOSAMAX) 70 MG tablet Take 70 mg by mouth every Saturday. 04/09/21   [provider]  Cholecalciferol (VITAMIN  D) 125 MCG (5000 UT) CAPS Take 5,000 Units by mouth daily.    [provider]  metoprolol  succinate (TOPROL -XL) 25 MG 24 hr tablet Take 1 tablet (25 mg total) by mouth daily. Take with 50 mg for a total of 75 mg daily Patient taking differently: Take 25 mg by mouth daily. 10/04/23   Eilleen Grates, MD  metoprolol  succinate (TOPROL -XL) 50 MG 24 hr tablet Take 1 tablet (50 mg  total) by mouth daily. Take with the 25 mg total of 75 mg daily. Take with or immediately following a meal. Patient taking differently: Take 50 mg by mouth at bedtime. 10/04/23   Eilleen Grates, MD  rivaroxaban  (XARELTO ) 20 MG TABS tablet Take 1 tablet (20 mg total) by mouth daily with supper. 10/27/22   Eilleen Grates, MD  rosuvastatin  (CRESTOR ) 40 MG tablet Take 1 tablet (40 mg total) by mouth daily. 05/05/23 04/29/24  Eilleen Grates, MD  sacubitril -valsartan  (ENTRESTO ) 97-103 MG Take 1 tablet by mouth 2 (two) times daily. 08/10/23   Eilleen Grates, MD    Physical Exam    Vital Signs:  Richard Irwin does not have vital signs available for review today.  Given telephonic nature of communication, physical exam is limited. AAOx3. NAD. Normal affect.  Speech and respirations are unlabored.  Accessory Clinical Findings    None  Assessment & Plan    1.  Preoperative Cardiovascular Risk Assessment: According to the Revised Cardiac Risk Index (RCRI), his Perioperative Risk of Major Cardiac Event is (%): 0.9. His Functional Capacity in METs is: 7.59 according to the Duke Activity Status Index (DASI). The patient is doing well from a cardiac perspective. Therefore, based on ACC/AHA guidelines, the patient would be at acceptable risk for the planned procedure without further cardiovascular testing.   The patient was advised that if he develops new symptoms prior to surgery to contact our office to arrange for a follow-up visit, and he verbalized understanding.  He may hold Xarelto  for 3 days prior to procedure and should resume as soon as hemodynamically stable postoperatively. He will need bridging with Lovenox , instructions to be provided by our pharmacy team.    A copy of this note will be routed to requesting surgeon.  Time:   Today, I have spent 10 minutes with the patient with telehealth technology discussing medical history, symptoms, and management plan.    Gerldine Koch,  NP-C  03/01/2024, 2:24 PM [address], [address] [place], [place] [address] Office [phone] Fax [phone]

## 2024-03-05 NOTE — Patient Instructions (Signed)
 SURGICAL WAITING ROOM VISITATION  Patients having surgery or a procedure may have no more than 2 support people in the waiting area - these visitors may rotate.    Children under the age of 77 must have an adult with them who is not the patient.  Visitors with respiratory illnesses are discouraged from visiting and should remain at home.  If the patient needs to stay at the hospital during part of their recovery, the visitor guidelines for inpatient rooms apply. Pre-op nurse will coordinate an appropriate time for 1 support person to accompany patient in pre-op.  This support person may not rotate.    Please refer to the [facility] website for the visitor guidelines for Inpatients (after your surgery is over and you are in a regular room).       Your procedure is scheduled on: 03/12/24   Report to [facility] Main Entrance    Report to admitting at : 7:15 AM   Call this number if you have problems the morning of surgery [phone]   Do not eat food or drink: After Midnight.  FOLLOW ANY ADDITIONAL PRE OP INSTRUCTIONS YOU RECEIVED FROM YOUR SURGEON'S OFFICE!!!   Oral Hygiene is also important to reduce your risk of infection.                                    Remember - BRUSH YOUR TEETH THE MORNING OF SURGERY WITH YOUR REGULAR TOOTHPASTE  DENTURES WILL BE REMOVED PRIOR TO SURGERY PLEASE DO NOT APPLY Poly grip OR ADHESIVES!!!   Do NOT smoke after Midnight   Stop all vitamins and herbal supplements 7 days before surgery.   Take these medicines the morning of surgery with A SIP OF WATER: metoprolol .                 You may not have any metal on your body including hair pins, jewelry, and body piercing             Do not wear lotions, powders, perfumes/cologne, or deodorant              Men may shave face and neck.   Do not bring valuables to the hospital. [facility] IS NOT             RESPONSIBLE   FOR VALUABLES.   Contacts, glasses, dentures or bridgework may  not be worn into surgery.   Bring small overnight bag day of surgery.   DO NOT BRING YOUR HOME MEDICATIONS TO THE HOSPITAL. PHARMACY WILL DISPENSE MEDICATIONS LISTED ON YOUR MEDICATION LIST TO YOU DURING YOUR ADMISSION IN THE HOSPITAL!    Patients discharged on the day of surgery will not be allowed to drive home.  Someone NEEDS to stay with you for the first 24 hours after anesthesia.   Special Instructions: Bring a copy of your healthcare power of attorney and living will documents the day of surgery if you haven't scanned them before.              Please read over the following fact sheets you were given: IF YOU HAVE QUESTIONS ABOUT YOUR PRE-OP INSTRUCTIONS PLEASE CALL [phone]   If you received a COVID test during your pre-op visit  it is requested that you wear a mask when out in public, stay away from anyone that may not be feeling well and notify your surgeon if you develop symptoms.  If you test positive for Covid or have been in contact with anyone that has tested positive in the last 10 days please notify you surgeon.    [place] - Preparing for Surgery Before surgery, you can play an important role.  Because skin is not sterile, your skin needs to be as free of germs as possible.  You can reduce the number of germs on your skin by washing with CHG (chlorahexidine gluconate) soap before surgery.  CHG is an antiseptic cleaner which kills germs and bonds with the skin to continue killing germs even after washing. Please DO NOT use if you have an allergy to CHG or antibacterial soaps.  If your skin becomes reddened/irritated stop using the CHG and inform your nurse when you arrive at Short Stay. Do not shave (including legs and underarms) for at least 48 hours prior to the first CHG shower.  You may shave your face/neck. Please follow these instructions carefully:  1.  Shower with CHG Soap the night before surgery and the  morning of Surgery.  2.  If you choose to wash your hair,  wash your hair first as usual with your  normal  shampoo.  3.  After you shampoo, rinse your hair and body thoroughly to remove the  shampoo.                           4.  Use CHG as you would any other liquid soap.  You can apply chg directly  to the skin and wash                       Gently with a scrungie or clean washcloth.  5.  Apply the CHG Soap to your body ONLY FROM THE NECK DOWN.   Do not use on face/ open                           Wound or open sores. Avoid contact with eyes, ears mouth and genitals (private parts).                       Wash face,  Genitals (private parts) with your normal soap.             6.  Wash thoroughly, paying special attention to the area where your surgery  will be performed.  7.  Thoroughly rinse your body with warm water from the neck down.  8.  DO NOT shower/wash with your normal soap after using and rinsing off  the CHG Soap.                9.  Pat yourself dry with a clean towel.            10.  Wear clean pajamas.            11.  Place clean sheets on your bed the night of your first shower and do not  sleep with pets. Day of Surgery : Do not apply any lotions/deodorants the morning of surgery.  Please wear clean clothes to the hospital/surgery center.  FAILURE TO FOLLOW THESE INSTRUCTIONS MAY RESULT IN THE CANCELLATION OF YOUR SURGERY PATIENT SIGNATURE_________________________________  NURSE SIGNATURE__________________________________  ________________________________________________________________________

## 2024-03-06 ENCOUNTER — Other Ambulatory Visit: Payer: Self-pay

## 2024-03-06 ENCOUNTER — Encounter (HOSPITAL_COMMUNITY): Payer: Self-pay

## 2024-03-06 ENCOUNTER — Encounter (HOSPITAL_COMMUNITY)
Admission: RE | Admit: 2024-03-06 | Discharge: 2024-03-06 | Disposition: A | Discharge status: Home / Self Care | Source: Ambulatory Visit | Attending: Urology | Admitting: Urology

## 2024-03-06 VITALS — BP 130/71 | HR 65 | Temp 97.9°F | Ht 71.0 in | Wt 163.0 lb

## 2024-03-06 DIAGNOSIS — Z7901 Long term (current) use of anticoagulants: Secondary | ICD-10-CM | POA: Diagnosis not present

## 2024-03-06 DIAGNOSIS — I110 Hypertensive heart disease with heart failure: Secondary | ICD-10-CM | POA: Insufficient documentation

## 2024-03-06 DIAGNOSIS — I10 Essential (primary) hypertension: Secondary | ICD-10-CM

## 2024-03-06 DIAGNOSIS — I509 Heart failure, unspecified: Secondary | ICD-10-CM | POA: Insufficient documentation

## 2024-03-06 DIAGNOSIS — Z01812 Encounter for preprocedural laboratory examination: Secondary | ICD-10-CM | POA: Diagnosis not present

## 2024-03-06 DIAGNOSIS — K7460 Unspecified cirrhosis of liver: Secondary | ICD-10-CM | POA: Insufficient documentation

## 2024-03-06 DIAGNOSIS — I2510 Atherosclerotic heart disease of native coronary artery without angina pectoris: Secondary | ICD-10-CM | POA: Insufficient documentation

## 2024-03-06 DIAGNOSIS — K219 Gastro-esophageal reflux disease without esophagitis: Secondary | ICD-10-CM | POA: Diagnosis not present

## 2024-03-06 DIAGNOSIS — N5201 Erectile dysfunction due to arterial insufficiency: Secondary | ICD-10-CM | POA: Insufficient documentation

## 2024-03-06 DIAGNOSIS — Z86711 Personal history of pulmonary embolism: Secondary | ICD-10-CM | POA: Insufficient documentation

## 2024-03-06 DIAGNOSIS — Z79899 Other long term (current) drug therapy: Secondary | ICD-10-CM | POA: Insufficient documentation

## 2024-03-06 DIAGNOSIS — J449 Chronic obstructive pulmonary disease, unspecified: Secondary | ICD-10-CM | POA: Insufficient documentation

## 2024-03-06 DIAGNOSIS — I480 Paroxysmal atrial fibrillation: Secondary | ICD-10-CM | POA: Insufficient documentation

## 2024-03-06 HISTORY — DX: Cardiac arrhythmia, unspecified: I49.9

## 2024-03-06 HISTORY — DX: Heart failure, unspecified: I50.9

## 2024-03-06 HISTORY — DX: Unspecified osteoarthritis, unspecified site: M19.90

## 2024-03-06 LAB — BASIC METABOLIC PANEL WITH GFR
Anion gap: 7 (ref 5–15)
BUN: 11 mg/dL (ref 8–23)
CO2: 26 mmol/L (ref 22–32)
Calcium: 9.5 mg/dL (ref 8.9–10.3)
Chloride: 103 mmol/L (ref 98–111)
Creatinine, Ser: 0.77 mg/dL (ref 0.61–1.24)
GFR, Estimated: >60 mL/min (ref >60)
Glucose, Bld: 111 mg/dL — ABNORMAL HIGH (ref 70–99)
Potassium: 4.7 mmol/L (ref 3.5–5.1)
Sodium: 136 mmol/L (ref 135–145)

## 2024-03-06 LAB — CBC
HCT: 42.9 % (ref 39.0–52.0)
Hemoglobin: 14.1 g/dL (ref 13.0–17.0)
MCH: 32.5 pg (ref 26.0–34.0)
MCHC: 32.9 g/dL (ref 30.0–36.0)
MCV: 98.8 fL (ref 80.0–100.0)
Platelets: 120 10*3/uL — ABNORMAL LOW (ref 150–400)
RBC: 4.34 MIL/uL (ref 4.22–5.81)
RDW: 13.2 % (ref 11.5–15.5)
WBC: 4.8 10*3/uL (ref 4.0–10.5)
nRBC: 0 % (ref 0.0–0.2)

## 2024-03-06 NOTE — Progress Notes (Signed)
 For Anesthesia: PCP - Jimmey Mould, MD  Cardiologist - Eilleen Grates, MD LOV: 12/01/23 Clearance: Gerldine Koch, NP : 03/01/24 Bowel Prep reminder:  Chest x-ray - CT angio chest: 12/21/23 EKG - 04/17/23 Stress Test -  ECHO - 12/26/23 Cardiac Cath -  Pacemaker/ICD device last checked: Pacemaker orders received: Device Rep notified:  Spinal Cord Stimulator:N/A  Sleep Study - N/A CPAP -   Fasting Blood Sugar - N/A Checks Blood Sugar _____ times a day Date and result of last Hgb A1c-  Last dose of GLP1 agonist- N/A GLP1 instructions:   Last dose of SGLT-2 inhibitors- N/A SGLT-2 instructions:   Blood Thinner Instructions:Xarelto : To hold after: 03/08/24. Bridge with lovenov: start: 03/09/24 Aspirin Instructions: Last Dose:  Activity level: Can go up a flight of stairs and activities of daily living without stopping and without chest pain and/or shortness of breath   Able to exercise without chest pain and/or shortness of breath  Anesthesia review: Hx:h/o chronic HFrEF, HTN, CAD, PE, esophageal varices, COPD, thoracic aortic aneurysm, atrial fibrillation s/p ablation 11/2022 with ERAF, syncope in the setting of Etoh withdrawal with episode of NSVT during admission,emphysema.  Patient denies shortness of breath, fever, cough and chest pain at PAT appointment   Patient verbalized understanding of instructions that were reviewed over the telephone.

## 2024-03-07 LAB — URINE CULTURE: Culture: NO GROWTH

## 2024-03-07 NOTE — Anesthesia Preprocedure Evaluation (Signed)
 Anesthesia Evaluation  Patient identified by MRN, date of birth, ID band Patient awake    Reviewed: Allergy & Precautions, NPO status , Patient's Chart, lab work & pertinent test results, reviewed documented beta blocker date and time   Airway Mallampati: II  TM Distance: >3 FB Neck ROM: Full    Dental no notable dental hx. (+) Teeth Intact, Dental Advisory Given   Pulmonary COPD, PE   Pulmonary exam normal breath sounds clear to auscultation       Cardiovascular hypertension, Pt. on home beta blockers and Pt. on medications + CAD  Normal cardiovascular exam+ dysrhythmias (xarelto , no missed doses) Atrial Fibrillation  Rhythm:Irregular Rate:Normal  Myo 4/25   ECG demonstrates incomplete left bundle branch block and left anterior fascicular block.   A pharmacological stress test was performed using IV Lexiscan  0.4mg  over 10 seconds performed without concurrent submaximal exercise. The patient reported dizziness during the stress test. Normal blood pressure and normal heart rate response noted during stress. Heart rate recovery was normal.   No ST deviation was noted during infusion but there T wave inversions that ocurred in the inferolateral leads in recovery. The ECG was not diagnostic due to pharmacologic protocol.   LV perfusion is abnormal. There is no evidence of ischemia. There is no evidence of infarction. Defect 1: There is a small defect with mild reduction in uptake present in the apical inferior location(s) that is fixed. Consistent with artifact caused by bowel tracer uptake and diaphragmatic attenuation.   Left ventricular function is abnormal. Global function is mildly reduced. Nuclear stress EF: 44%. The left ventricular ejection fraction is moderately decreased (30-44%). End diastolic cavity size is normal. End systolic cavity size is normal. No evidence of transient ischemic dilation (TID) noted.   Findings are  consistent with no ischemia. The study is intermediate risk.  Echo 4/25   1. Left ventricular ejection fraction, by estimation, is 40 to 45%. The  left ventricle has mildly decreased function. The left ventricle  demonstrates global hypokinesis. Left ventricular diastolic parameters are  indeterminate.   2. Right ventricular systolic function is normal. The right ventricular  size is mildly enlarged. Tricuspid regurgitation signal is inadequate for  assessing PA pressure.   3. The mitral valve is grossly normal. Mild mitral valve regurgitation.  No evidence of mitral stenosis.   4. The aortic valve is tricuspid. Aortic valve regurgitation is mild. No  aortic stenosis is present.   5. Aortic dilatation noted. Aneurysm of the aortic root, measuring 48 mm.  There is moderate dilatation of the ascending aorta, measuring 44 mm.   6. The inferior vena cava is normal in size with greater than 50%  respiratory variability, suggesting right atrial pressure of 3 mmHg.      Neuro/Psych negative neurological ROS  negative psych ROS   GI/Hepatic negative GI ROS,,,(+) Cirrhosis   Esophageal Varices  substance abuse  alcohol use  Endo/Other  negative endocrine ROS    Renal/GU negative Renal ROS  negative genitourinary   Musculoskeletal negative musculoskeletal ROS (+)    Abdominal   Peds  Hematology negative hematology ROS (+)   Anesthesia Other Findings   Reproductive/Obstetrics                             Anesthesia Physical Anesthesia Plan  ASA: 3  Anesthesia Plan: General   Post-op Pain Management: Tylenol  PO (pre-op)*   Induction: Intravenous  PONV Risk Score and Plan:  2 and Propofol  infusion and Treatment may vary due to age or medical condition  Airway Management Planned: LMA  Additional Equipment: None  Intra-op Plan:   Post-operative Plan: Extubation in OR  Informed Consent: I have reviewed the patients History and Physical,  chart, labs and discussed the procedure including the risks, benefits and alternatives for the proposed anesthesia with the patient or authorized representative who has indicated his/her understanding and acceptance.     Dental advisory given  Plan Discussed with: CRNA and Anesthesiologist  Anesthesia Plan Comments: (See PAT note from 6/18)       Anesthesia Quick Evaluation

## 2024-03-07 NOTE — Progress Notes (Signed)
 Case: [id number] Date/Time: 03/12/24 0915   Procedure: INSERTION, PENILE PROSTHESIS - INSERTION OF INFLATABLE PENILE PROSTHESIS   Anesthesia type: General   Diagnosis: Erectile dysfunction due to arterial insufficiency [N52.01]   Pre-op diagnosis: ERECTILE DYSFUNCTION   Location[facility] ROOM 05 / [facility]   Surgeons: Mallie Seal, MD       DISCUSSION: Richard Irwin is a 78 yo male who presents to PAT prior to surgery above. PMH of HTN, CAD (by CT), CHF, NSVT, PAF, dilated aortic root (4.9cm), TAA (4.4cm), hx of PE, COPD, GERD, cirrhosis with esophageal varices, hx of ETOH abuse (in remission)  Patient follows with Cardiology for hx of .A.fib s/p DCCV x 2 (06/2022, 12/2022), and A.fib ablation (11/2022), dilated aortic root/TAA, CAD, and CHF. Last seen by Dr. Lavonne Prairie on 12/01/23. He was stable at that visit. He was recommended to have updated echo to check on his heart function which has remained reduced to 40-45% and was recommended to have a stress test which showed no evidence of ischemia per Dr. Aaron Aas interpretation but was intermediate risk. CTA was done for surveillance of his aorta and has mildly increased in size. He was recommended to have repeat CTA in 1 year. Cardiac clearance obtained on 6/13:  Preoperative Cardiovascular Risk Assessment: According to the Revised Cardiac Risk Index (RCRI), his Perioperative Risk of Major Cardiac Event is (%): 0.9. His Functional Capacity in METs is: 7.59 according to the Duke Activity Status Index (DASI). The patient is doing well from a cardiac perspective. Therefore, based on ACC/AHA guidelines, the patient would be at acceptable risk for the planned procedure without further cardiovascular testing.  He may hold Xarelto  for 3 days prior to procedure and should resume as soon as hemodynamically stable postoperatively. He will need bridging with Lovenox , instructions to be provided by our pharmacy team.  Lovenox  bridge instruction in 03/01/24  telephone encounter: Xarelto  to Lovenox  bridge for procedure on 03/12/2024 Take Xarelto  daily like normal through 03/08/2024 03/09/2024 - inject Lovenox  80mg  subcutaneously in the fatty tissue of the abdomen in the evening, no Xarelto  03/10/2024 - inject Lovenox  80mg  twice a day (in the morning and in the evening approx. 12 hrs. apart), no Xarelto  03/11/2024 - inject Lovenox  80 mg in the morning, NO evening dose, no Xarelto   03/12/2024 - Procedure date, NO Lovenox , NO Xarelto   03/13/2024- resume Xarelto  as advised by MD   VS: BP 130/71   Pulse 65   Temp 36.6 C (Oral)   Ht 5' 11 (1.803 m)   Wt 73.9 kg   SpO2 99%   BMI 22.73 kg/m   PROVIDERS: Jimmey Mould, MD   LABS: Labs reviewed: Acceptable for surgery. (all labs ordered are listed, but only abnormal results are displayed)  Labs Reviewed  BASIC METABOLIC PANEL WITH GFR - Abnormal; Notable for the following components:      Result Value   Glucose, Bld 111 (*)    All other components within normal limits  CBC - Abnormal; Notable for the following components:   Platelets 120 (*)    All other components within normal limits  URINE CULTURE      CV:  Stress test 01/09/24:    ECG demonstrates incomplete left bundle branch block and left anterior fascicular block.   A pharmacological stress test was performed using IV Lexiscan  0.4mg  over 10 seconds performed without concurrent submaximal exercise. The patient reported dizziness during the stress test. Normal blood pressure and normal heart rate response noted during stress. Heart  rate recovery was normal.   No ST deviation was noted during infusion but there T wave inversions that ocurred in the inferolateral leads in recovery. The ECG was not diagnostic due to pharmacologic protocol.   LV perfusion is abnormal. There is no evidence of ischemia. There is no evidence of infarction. Defect 1: There is a small defect with mild reduction in uptake present in the apical  inferior location(s) that is fixed. Consistent with artifact caused by bowel tracer uptake and diaphragmatic attenuation.   Left ventricular function is abnormal. Global function is mildly reduced. Nuclear stress EF: 44%. The left ventricular ejection fraction is moderately decreased (30-44%). End diastolic cavity size is normal. End systolic cavity size is normal. No evidence of transient ischemic dilation (TID) noted.   Findings are consistent with no ischemia. The study is intermediate risk.  Echo 12/26/23:  IMPRESSIONS     1. Left ventricular ejection fraction, by estimation, is 40 to 45%. The  left ventricle has mildly decreased function. The left ventricle  demonstrates global hypokinesis. Left ventricular diastolic parameters are  indeterminate.   2. Right ventricular systolic function is normal. The right ventricular  size is mildly enlarged. Tricuspid regurgitation signal is inadequate for  assessing PA pressure.   3. The mitral valve is grossly normal. Mild mitral valve regurgitation.  No evidence of mitral stenosis.   4. The aortic valve is tricuspid. Aortic valve regurgitation is mild. No  aortic stenosis is present.   5. Aortic dilatation noted. Aneurysm of the aortic root, measuring 48 mm.  There is moderate dilatation of the ascending aorta, measuring 44 mm.   6. The inferior vena cava is normal in size with greater than 50%  respiratory variability, suggesting right atrial pressure of 3 mmHg.   Comparison(s): No significant change from prior study.   CTA Chest 12/08/23:  IMPRESSION: Dilated aortic root measuring up to 4.9 cm. Ascending thoracic aorta measures up to 4.4 cm. This is not significantly changed since prior study. Recommend semi-annual imaging followup by CTA or MRA and referral to cardiothoracic surgery if not already obtained. This recommendation follows 2010 ACCF/AHA/AATS/ACR/ASA/SCA/SCAI/SIR/STS/SVM Guidelines for the Diagnosis and Management of  Patients With Thoracic Aortic Disease. Circulation. 2010; 121: Z610-R604. Aortic aneurysm NOS (ICD10-I71.9)   Coronary artery disease.   Aortic Atherosclerosis (ICD10-I70.0).   Past Medical History:  Diagnosis Date   Acute blood loss anemia    Anemia    Arthritis    CHF (congestive heart failure) (HCC)    Diverticulosis    Dysrhythmia    Emphysema/COPD (HCC)    Esophageal varices (HCC)    H/O: GI bleed    Hypertension    Insomnia    Status post colonoscopy with polypectomy    with bleed   Thoracic ascending aortic aneurysm (HCC) 05/07/2013   Tubular adenoma     Past Surgical History:  Procedure Laterality Date   APPENDECTOMY     ATRIAL FIBRILLATION ABLATION N/A 12/09/2022   Procedure: ATRIAL FIBRILLATION ABLATION;  Surgeon: Efraim Grange, MD;  [facility];  Service: Cardiovascular;  Laterality: N/A;   CARDIOVERSION N/A 07/07/2022   Procedure: CARDIOVERSION;  Surgeon: Hugh Madura, MD;  Location[facility] ENDOSCOPY;  Service: Cardiovascular;  Laterality: N/A;   CARDIOVERSION N/A 01/16/2023   Procedure: CARDIOVERSION;  Surgeon: Sonny Dust, MD;  Location: [facility];  Service: Cardiovascular;  Laterality: N/A;   CATARACT EXTRACTION, BILATERAL Bilateral    COLONOSCOPY WITH PROPOFOL  N/A 07/09/2014   Procedure: COLONOSCOPY  WITH PROPOFOL ;  Surgeon: Tobin Forts, MD;  Location: [facility] ENDOSCOPY;  Service: Endoscopy;  Laterality: N/A;   EXCISIONAL HEMORRHOIDECTOMY     HERNIA REPAIR     ROTATOR CUFF REPAIR Right    TONSILLECTOMY      MEDICATIONS:  alendronate (FOSAMAX) 70 MG tablet   Cholecalciferol (VITAMIN D) 125 MCG (5000 UT) CAPS   enoxaparin  (LOVENOX ) 80 MG/0.8ML injection   metoprolol  succinate (TOPROL -XL) 25 MG 24 hr tablet   metoprolol  succinate (TOPROL -XL) 50 MG 24 hr tablet   rivaroxaban  (XARELTO ) 20 MG TABS tablet   rosuvastatin  (CRESTOR ) 40 MG tablet   sacubitril -valsartan  (ENTRESTO ) 97-103 MG   No current facility-administered  medications for this encounter.   Antoinette Kirschner [facility] Short Stay/Anesthesiology [facility] Phone [phone] 03/07/2024 10:10 AM

## 2024-03-12 ENCOUNTER — Other Ambulatory Visit: Payer: Self-pay

## 2024-03-12 ENCOUNTER — Encounter (HOSPITAL_COMMUNITY): Payer: Self-pay | Admitting: Urology

## 2024-03-12 ENCOUNTER — Ambulatory Visit (HOSPITAL_BASED_OUTPATIENT_CLINIC_OR_DEPARTMENT_OTHER): Admitting: Anesthesiology

## 2024-03-12 ENCOUNTER — Encounter (HOSPITAL_COMMUNITY)
Admission: RE | Disposition: A | Discharge status: Home / Self Care | Payer: Self-pay | Source: Home / Self Care | Attending: Urology

## 2024-03-12 ENCOUNTER — Ambulatory Visit (HOSPITAL_COMMUNITY): Payer: Self-pay | Admitting: Medical

## 2024-03-12 ENCOUNTER — Ambulatory Visit (HOSPITAL_COMMUNITY)
Admission: RE | Admit: 2024-03-12 | Discharge: 2024-03-12 | Disposition: A | Discharge status: Home / Self Care | Attending: Urology | Admitting: Urology

## 2024-03-12 DIAGNOSIS — J449 Chronic obstructive pulmonary disease, unspecified: Secondary | ICD-10-CM | POA: Insufficient documentation

## 2024-03-12 DIAGNOSIS — I8510 Secondary esophageal varices without bleeding: Secondary | ICD-10-CM | POA: Diagnosis not present

## 2024-03-12 DIAGNOSIS — I5022 Chronic systolic (congestive) heart failure: Secondary | ICD-10-CM

## 2024-03-12 DIAGNOSIS — I080 Rheumatic disorders of both mitral and aortic valves: Secondary | ICD-10-CM | POA: Insufficient documentation

## 2024-03-12 DIAGNOSIS — N529 Male erectile dysfunction, unspecified: Secondary | ICD-10-CM | POA: Insufficient documentation

## 2024-03-12 DIAGNOSIS — I2510 Atherosclerotic heart disease of native coronary artery without angina pectoris: Secondary | ICD-10-CM | POA: Diagnosis not present

## 2024-03-12 DIAGNOSIS — I509 Heart failure, unspecified: Secondary | ICD-10-CM | POA: Diagnosis not present

## 2024-03-12 DIAGNOSIS — Z79899 Other long term (current) drug therapy: Secondary | ICD-10-CM | POA: Diagnosis not present

## 2024-03-12 DIAGNOSIS — I8500 Esophageal varices without bleeding: Secondary | ICD-10-CM | POA: Insufficient documentation

## 2024-03-12 DIAGNOSIS — I110 Hypertensive heart disease with heart failure: Secondary | ICD-10-CM | POA: Insufficient documentation

## 2024-03-12 DIAGNOSIS — I7121 Aneurysm of the ascending aorta, without rupture: Secondary | ICD-10-CM | POA: Diagnosis not present

## 2024-03-12 DIAGNOSIS — I444 Left anterior fascicular block: Secondary | ICD-10-CM | POA: Diagnosis not present

## 2024-03-12 DIAGNOSIS — I447 Left bundle-branch block, unspecified: Secondary | ICD-10-CM | POA: Insufficient documentation

## 2024-03-12 DIAGNOSIS — K7460 Unspecified cirrhosis of liver: Secondary | ICD-10-CM | POA: Insufficient documentation

## 2024-03-12 DIAGNOSIS — I10 Essential (primary) hypertension: Secondary | ICD-10-CM | POA: Insufficient documentation

## 2024-03-12 DIAGNOSIS — I4891 Unspecified atrial fibrillation: Secondary | ICD-10-CM | POA: Diagnosis not present

## 2024-03-12 DIAGNOSIS — N5201 Erectile dysfunction due to arterial insufficiency: Secondary | ICD-10-CM | POA: Diagnosis present

## 2024-03-12 DIAGNOSIS — Z7901 Long term (current) use of anticoagulants: Secondary | ICD-10-CM | POA: Diagnosis not present

## 2024-03-12 HISTORY — PX: PENILE PROSTHESIS IMPLANT: SHX240

## 2024-03-12 SURGERY — INSERTION, PENILE PROSTHESIS
Anesthesia: General

## 2024-03-12 MED ORDER — DEXMEDETOMIDINE HCL IN NACL 80 MCG/20ML IV SOLN
INTRAVENOUS | Status: AC
Start: 2024-03-12 — End: 2024-03-12
  Filled 2024-03-12: qty 20

## 2024-03-12 MED ORDER — FENTANYL CITRATE PF 50 MCG/ML IJ SOSY
25.0000 ug | PREFILLED_SYRINGE | INTRAMUSCULAR | Status: DC | PRN
  Administered 2024-03-12: 50 ug via INTRAVENOUS

## 2024-03-12 MED ORDER — DEXMEDETOMIDINE HCL IN NACL 80 MCG/20ML IV SOLN
INTRAVENOUS | Status: DC | PRN
Start: 2024-03-12 — End: 2024-03-12
  Administered 2024-03-12 (×2): 4 ug via INTRAVENOUS

## 2024-03-12 MED ORDER — PROPOFOL 500 MG/50ML IV EMUL
INTRAVENOUS | Status: DC | PRN
  Administered 2024-03-12: 150 mg via INTRAVENOUS
  Administered 2024-03-12: 50 mg via INTRAVENOUS

## 2024-03-12 MED ORDER — BUPIVACAINE HCL 0.5 % IJ SOLN
INTRAMUSCULAR | Status: DC | PRN
  Administered 2024-03-12: 7.5 mL

## 2024-03-12 MED ORDER — LIDOCAINE HCL 1 % IJ SOLN
INTRAMUSCULAR | Status: DC | PRN
  Administered 2024-03-12: 7.5 mL

## 2024-03-12 MED ORDER — MEPERIDINE HCL 50 MG/ML IJ SOLN: 6.2500 mg | INTRAMUSCULAR | Status: DC | PRN

## 2024-03-12 MED ORDER — FENTANYL CITRATE (PF) 100 MCG/2ML IJ SOLN
INTRAMUSCULAR | Status: DC | PRN
  Administered 2024-03-12 (×2): 25 ug via INTRAVENOUS
  Administered 2024-03-12 (×3): 50 ug via INTRAVENOUS

## 2024-03-12 MED ORDER — LACTATED RINGERS IV SOLN: INTRAVENOUS | Status: DC

## 2024-03-12 MED ORDER — EPHEDRINE SULFATE-NACL 50-0.9 MG/10ML-% IV SOSY
PREFILLED_SYRINGE | INTRAVENOUS | Status: DC | PRN
  Administered 2024-03-12 (×2): 5 mg via INTRAVENOUS
  Administered 2024-03-12: 10 mg via INTRAVENOUS
  Administered 2024-03-12 (×3): 5 mg via INTRAVENOUS

## 2024-03-12 MED ORDER — EPHEDRINE 5 MG/ML INJ
INTRAVENOUS | Status: AC
Start: 2024-03-12 — End: 2024-03-12
  Filled 2024-03-12: qty 10

## 2024-03-12 MED ORDER — OXYCODONE HCL 5 MG/5ML PO SOLN: 5.0000 mg | Freq: Once | ORAL | Status: DC | PRN

## 2024-03-12 MED ORDER — FLUCONAZOLE IN SODIUM CHLORIDE 200-0.9 MG/100ML-% IV SOLN
200.0000 mg | INTRAVENOUS | Status: DC
  Administered 2024-03-12: 200 mg via INTRAVENOUS
  Filled 2024-03-12: qty 100

## 2024-03-12 MED ORDER — IRRISEPT - 450ML BOTTLE WITH 0.05% CHG IN STERILE WATER, USP 99.95% OPTIME
TOPICAL | Status: DC | PRN
  Administered 2024-03-12 (×2): 450 mL via TOPICAL

## 2024-03-12 MED ORDER — GENTAMICIN SULFATE 40 MG/ML IJ SOLN
5.0000 mg/kg | INTRAVENOUS | Status: AC
  Administered 2024-03-12: 376.4 mg via INTRAVENOUS
  Filled 2024-03-12: qty 9.5

## 2024-03-12 MED ORDER — ACETAMINOPHEN 500 MG PO TABS: 1000.0000 mg | ORAL_TABLET | Freq: Four times a day (QID) | ORAL | 0 refills | Status: AC

## 2024-03-12 MED ORDER — CELECOXIB 200 MG PO CAPS: 200.0000 mg | ORAL_CAPSULE | Freq: Two times a day (BID) | ORAL | 1 refills | Status: AC

## 2024-03-12 MED ORDER — SULFAMETHOXAZOLE-TRIMETHOPRIM 800-160 MG PO TABS: 1.0000 | ORAL_TABLET | Freq: Two times a day (BID) | ORAL | 0 refills | Status: AC

## 2024-03-12 MED ORDER — LIDOCAINE HCL (PF) 1 % IJ SOLN
INTRAMUSCULAR | Status: AC
Start: 2024-03-12 — End: 2024-03-12
  Filled 2024-03-12: qty 30

## 2024-03-12 MED ORDER — BUPIVACAINE HCL (PF) 0.5 % IJ SOLN
INTRAMUSCULAR | Status: AC
  Filled 2024-03-12: qty 30

## 2024-03-12 MED ORDER — PHENYLEPHRINE 80 MCG/ML (10ML) SYRINGE FOR IV PUSH (FOR BLOOD PRESSURE SUPPORT)
PREFILLED_SYRINGE | INTRAVENOUS | Status: DC | PRN
  Administered 2024-03-12 (×5): 80 ug via INTRAVENOUS

## 2024-03-12 MED ORDER — DEXAMETHASONE SODIUM PHOSPHATE 10 MG/ML IJ SOLN
INTRAMUSCULAR | Status: DC | PRN
  Administered 2024-03-12: 10 mg via INTRAVENOUS

## 2024-03-12 MED ORDER — OXYCODONE HCL 5 MG PO TABS: 5.0000 mg | ORAL_TABLET | Freq: Once | ORAL | Status: DC | PRN

## 2024-03-12 MED ORDER — ONDANSETRON HCL 4 MG/2ML IJ SOLN
INTRAMUSCULAR | Status: AC
  Filled 2024-03-12: qty 2

## 2024-03-12 MED ORDER — ONDANSETRON HCL 4 MG/2ML IJ SOLN: 4.0000 mg | Freq: Once | INTRAMUSCULAR | Status: DC | PRN

## 2024-03-12 MED ORDER — VANCOMYCIN HCL IN DEXTROSE 1-5 GM/200ML-% IV SOLN
1000.0000 mg | INTRAVENOUS | Status: AC
  Administered 2024-03-12: 1000 mg via INTRAVENOUS
  Filled 2024-03-12: qty 200

## 2024-03-12 MED ORDER — DEXAMETHASONE SODIUM PHOSPHATE 10 MG/ML IJ SOLN
INTRAMUSCULAR | Status: AC
  Filled 2024-03-12: qty 1

## 2024-03-12 MED ORDER — OXYCODONE HCL 5 MG PO TABS: 5.0000 mg | ORAL_TABLET | Freq: Four times a day (QID) | ORAL | 0 refills | Status: AC | PRN

## 2024-03-12 MED ORDER — PROPOFOL 10 MG/ML IV BOLUS
INTRAVENOUS | Status: AC
  Filled 2024-03-12: qty 20

## 2024-03-12 MED ORDER — CHLORHEXIDINE GLUCONATE 4 % EX SOLN: Freq: Once | CUTANEOUS | Status: DC

## 2024-03-12 MED ORDER — ACETAMINOPHEN 500 MG PO TABS
1000.0000 mg | ORAL_TABLET | Freq: Once | ORAL | Status: AC
  Administered 2024-03-12: 1000 mg via ORAL
  Filled 2024-03-12: qty 2

## 2024-03-12 MED ORDER — CHLORHEXIDINE GLUCONATE 0.12 % MT SOLN
15.0000 mL | Freq: Once | OROMUCOSAL | Status: AC
  Administered 2024-03-12: 15 mL via OROMUCOSAL

## 2024-03-12 MED ORDER — LIDOCAINE HCL (PF) 2 % IJ SOLN
INTRAMUSCULAR | Status: DC | PRN
  Administered 2024-03-12: 100 mg via INTRADERMAL

## 2024-03-12 MED ORDER — MUPIROCIN 2 % EX OINT: 1.0000 | TOPICAL_OINTMENT | Freq: Once | CUTANEOUS | Status: DC

## 2024-03-12 MED ORDER — SODIUM CHLORIDE 0.9 % IR SOLN
Status: DC | PRN
  Administered 2024-03-12: 1000 mL

## 2024-03-12 MED ORDER — STERILE WATER FOR IRRIGATION IR SOLN
Status: DC | PRN
Start: 2024-03-12 — End: 2024-03-12
  Administered 2024-03-12: 500 mL

## 2024-03-12 MED ORDER — ORAL CARE MOUTH RINSE: 15.0000 mL | Freq: Once | OROMUCOSAL | Status: AC

## 2024-03-12 MED ORDER — ONDANSETRON HCL 4 MG/2ML IJ SOLN
INTRAMUSCULAR | Status: DC | PRN
  Administered 2024-03-12: 4 mg via INTRAVENOUS

## 2024-03-12 MED ORDER — PHENYLEPHRINE 80 MCG/ML (10ML) SYRINGE FOR IV PUSH (FOR BLOOD PRESSURE SUPPORT)
PREFILLED_SYRINGE | INTRAVENOUS | Status: AC
  Filled 2024-03-12: qty 10

## 2024-03-12 MED ORDER — FENTANYL CITRATE (PF) 100 MCG/2ML IJ SOLN
INTRAMUSCULAR | Status: AC
  Filled 2024-03-12: qty 2

## 2024-03-12 MED ORDER — LIDOCAINE HCL (PF) 2 % IJ SOLN
INTRAMUSCULAR | Status: AC
  Filled 2024-03-12: qty 5

## 2024-03-12 MED ORDER — FENTANYL CITRATE PF 50 MCG/ML IJ SOSY
PREFILLED_SYRINGE | INTRAMUSCULAR | Status: AC
  Filled 2024-03-12: qty 1

## 2024-03-12 SURGICAL SUPPLY — 47 items
BAG URINE DRAIN 2000ML AR STRL (UROLOGICAL SUPPLIES) IMPLANT
BLADE SURG 15 STRL LF DISP TIS (BLADE) ×2 IMPLANT
BNDG GAUZE DERMACEA FLUFF 4 (GAUZE/BANDAGES/DRESSINGS) ×2 IMPLANT
BRIEF MESH DISP LRG (UNDERPADS AND DIAPERS) ×2 IMPLANT
CATH COUDE 5CC RIBBED (CATHETERS) ×2 IMPLANT
CHLORAPREP W/TINT 26 (MISCELLANEOUS) ×4 IMPLANT
COVER MAYO STAND STRL (DRAPES) ×2 IMPLANT
COVER SURGICAL LIGHT HANDLE (MISCELLANEOUS) ×2 IMPLANT
DERMABOND ADVANCED .7 DNX12 (GAUZE/BANDAGES/DRESSINGS) ×2 IMPLANT
DRAIN CHANNEL 10F 3/8 F FF (DRAIN) IMPLANT
DRAPE INCISE IOBAN 66X45 STRL (DRAPES) ×2 IMPLANT
DRAPE LAPAROTOMY T 98X78 PEDS (DRAPES) ×2 IMPLANT
DRSG TEGADERM 4X4.75 (GAUZE/BANDAGES/DRESSINGS) ×2 IMPLANT
ELECT REM PT RETURN 15FT ADLT (MISCELLANEOUS) ×2 IMPLANT
ETHIBOND 2 0 GREEN CT 2 30IN (SUTURE) IMPLANT
EVACUATOR SILICONE 100CC (DRAIN) IMPLANT
GLOVE BIO SURGEON STRL SZ7 (GLOVE) ×2 IMPLANT
GLOVE BIOGEL PI IND STRL 7.0 (GLOVE) ×2 IMPLANT
GOWN STRL REUS W/ TWL XL LVL3 (GOWN DISPOSABLE) ×2 IMPLANT
HOLDER FOLEY CATH W/STRAP (MISCELLANEOUS) IMPLANT
IMPL RTE SNAPCONE CX 1.0 (Urological Implant) IMPLANT
KIT ACCESSORY AMS 700 PUMP (Urological Implant) IMPLANT
KIT BASIN OR (CUSTOM PROCEDURE TRAY) ×2 IMPLANT
KIT TURNOVER KIT A (KITS) ×2 IMPLANT
NDL HYPO 22X1.5 SAFETY MO (MISCELLANEOUS) ×2 IMPLANT
NEEDLE HYPO 22X1.5 SAFETY MO (MISCELLANEOUS) ×1 IMPLANT
NS IRRIG 1000ML POUR BTL (IV SOLUTION) ×2 IMPLANT
PACK GENERAL/GYN (CUSTOM PROCEDURE TRAY) ×2 IMPLANT
PLUG CATH AND CAP STRL 200 (CATHETERS) ×2 IMPLANT
PROSTHESIS PENIL AMS 700 CX 24 (Urological Implant) IMPLANT
RESERVOIR FLAT IZ 100ML (Miscellaneous) IMPLANT
RETRACTOR DEEP SCROTAL PENILE (MISCELLANEOUS) IMPLANT
RETRACTOR WILSON SYSTEM (INSTRUMENTS) IMPLANT
SET COLLECT BLD 21X.75 12 PB G (NEEDLE) ×2 IMPLANT
SOLUTION PRONTOSAN WOUND 350ML (IRRIGATION / IRRIGATOR) IMPLANT
SURGILUBE 2OZ TUBE FLIPTOP (MISCELLANEOUS) IMPLANT
SUT ETHILON 3 0 PS 1 (SUTURE) IMPLANT
SUT MNCRL AB 4-0 PS2 18 (SUTURE) ×2 IMPLANT
SUT VIC AB 2-0 UR6 27 (SUTURE) ×8 IMPLANT
SUT VIC AB 3-0 SH 27X BRD (SUTURE) ×4 IMPLANT
SYR 10ML LL (SYRINGE) ×4 IMPLANT
SYR 20ML LL LF (SYRINGE) ×2 IMPLANT
SYR 50ML LL SCALE MARK (SYRINGE) ×6 IMPLANT
SYR CONTROL 10ML LL (SYRINGE) ×2 IMPLANT
TOWEL GREEN STERILE FF (TOWEL DISPOSABLE) ×2 IMPLANT
TOWEL OR 17X26 10 PK STRL BLUE (TOWEL DISPOSABLE) ×2 IMPLANT
WATER STERILE IRR 500ML POUR (IV SOLUTION) ×2 IMPLANT

## 2024-03-12 NOTE — Transfer of Care (Signed)
 Immediate Anesthesia Transfer of Care Note  Patient: Richard Irwin  Procedure(s) Performed: Procedure(s) with comments: INSERTION, PENILE PROSTHESIS (N/A) - INSERTION OF INFLATABLE PENILE PROSTHESIS  Patient Location: PACU  Anesthesia Type:General  Level of Consciousness: Patient easily awoken, comfortable, cooperative, following commands, responds to stimulation.   Airway & Oxygen Therapy: Patient spontaneously breathing, ventilating well, oxygen via simple oxygen mask.  Post-op Assessment: Report given to PACU RN, vital signs reviewed and stable, moving all extremities.   Post vital signs: Reviewed and stable.  Complications: No apparent anesthesia complications Last Vitals:  Vitals Value Taken Time  BP 122/67 03/12/24 11:39  Temp    Pulse 62 03/12/24 11:41  Resp 9 03/12/24 11:41  SpO2 100 % 03/12/24 11:41  Vitals shown include unfiled device data.  Last Pain:  Vitals:   03/12/24 0804  TempSrc:   PainSc: 0-No pain         Complications: No notable events documented.

## 2024-03-12 NOTE — H&P (Signed)
 H&P  History of Present Illness: Richard Irwin is a 78 y.o. year old M who presents today for insertion of an inflatable penile prosthesis  No acute complaints  Past Medical History:  Diagnosis Date   Acute blood loss anemia    Anemia    Arthritis    CHF (congestive heart failure) (HCC)    Diverticulosis    Dysrhythmia    Emphysema/COPD (HCC)    Esophageal varices (HCC)    H/O: GI bleed    Hypertension    Insomnia    Status post colonoscopy with polypectomy    with bleed   Thoracic ascending aortic aneurysm (HCC) 05/07/2013   Tubular adenoma     Past Surgical History:  Procedure Laterality Date   APPENDECTOMY     ATRIAL FIBRILLATION ABLATION N/A 12/09/2022   Procedure: ATRIAL FIBRILLATION ABLATION;  Surgeon: Nancey Eulas BRAVO, MD;  [facility];  Service: Cardiovascular;  Laterality: N/A;   CARDIOVERSION N/A 07/07/2022   Procedure: CARDIOVERSION;  Surgeon: Jeffrie Oneil BROCKS, MD;  Location: [facility] ENDOSCOPY;  Service: Cardiovascular;  Laterality: N/A;   CARDIOVERSION N/A 01/16/2023   Procedure: CARDIOVERSION;  Surgeon: Hobart Powell BRAVO, MD;  [facility];  Service: Cardiovascular;  Laterality: N/A;   CATARACT EXTRACTION, BILATERAL Bilateral    COLONOSCOPY WITH PROPOFOL  N/A 07/09/2014   Procedure: COLONOSCOPY WITH PROPOFOL ;  Surgeon: Norleen LOISE Kiang, MD;  Location: [facility] ENDOS[facility];  Service: Endoscopy;  Laterality: N/A;   EXCISIONAL HEMORRHOIDECTOMY     HERNIA REPAIR     ROTATOR CUFF REPAIR Right    TONSILLECTOMY      Home Medications:  Current Meds  Medication Sig   alendronate (FOSAMAX) 70 MG tablet Take 70 mg by mouth every Saturday.   Cholecalciferol (VITAMIN D) 125 MCG (5000 UT) CAPS Take 5,000 Units by mouth daily.   enoxaparin  (LOVENOX ) 80 MG/0.8ML injection Inject 0.8 mLs (80 mg total) into the skin every 12 (twelve) hours.   metoprolol  succinate (TOPROL -XL) 25 MG 24 hr tablet Take 1 tablet (25 mg total) by mouth daily. Take with 50  mg for a total of 75 mg daily (Patient taking differently: Take 25 mg by mouth daily.)   metoprolol  succinate (TOPROL -XL) 50 MG 24 hr tablet Take 1 tablet (50 mg total) by mouth daily. Take with the 25 mg total of 75 mg daily. Take with or immediately following a meal. (Patient taking differently: Take 50 mg by mouth at bedtime.)   rivaroxaban  (XARELTO ) 20 MG TABS tablet Take 1 tablet (20 mg total) by mouth daily with supper.   rosuvastatin  (CRESTOR ) 40 MG tablet Take 1 tablet (40 mg total) by mouth daily.   sacubitril -valsartan  (ENTRESTO ) 97-103 MG Take 1 tablet by mouth 2 (two) times daily.    Allergies:  Allergies  Allergen Reactions   Lisinopril Cough    Family History  Problem Relation Age of Onset   Cirrhosis Father    Lung cancer Mother    Colon cancer Neg Hx    Pancreatic cancer Neg Hx    Rectal cancer Neg Hx    Stomach cancer Neg Hx     Social History:  reports that he has never smoked. He has never used smokeless tobacco. He reports that he does not currently use alcohol. He reports that he does not use drugs.  ROS: A complete review of systems was performed.  All systems are negative except for pertinent findings as noted.  Physical Exam:  Vital signs in last 24  hours: Temp:  [98.1 F (36.7 C)] 98.1 F (36.7 C) (06/24 0700) Pulse Rate:  [65] 65 (06/24 0700) Resp:  [18] 18 (06/24 0700) BP: (134)/(81) 134/81 (06/24 0700) SpO2:  [97 %] 97 % (06/24 0700) Weight:  [73.9 kg-75.8 kg] 75.8 kg (06/24 0804) Constitutional:  Alert and oriented, No acute distress Cardiovascular: Regular rate and rhythm Respiratory: Normal respiratory effort, Lungs clear bilaterally GI: Abdomen is soft, nontender, nondistended, no abdominal masses Lymphatic: No lymphadenopathy Neurologic: Grossly intact, no focal deficits Psychiatric: Normal mood and affect   Laboratory Data:  No results for input(s): WBC, HGB, HCT, PLT in the last 72 hours.  No results for input(s): NA, K,  CL, GLUCOSE, BUN, CALCIUM , CREATININE in the last 72 hours.  Invalid input(s): CO3   No results found for this or any previous visit (from the past 24 hours). Recent Results (from the past 240 hours)  Urine Culture     Status: None   Collection Time: 03/06/24  9:03 AM   Specimen: Urine, Clean Catch  Result Value Ref Range Status   Specimen Description   Final    URINE, CLEAN CATCH Performed at [facility], [address] [place], [place] [address]    Special Requests   Final    NONE Performed at [facility], [address], [place], [place] [address]    Culture   Final    NO GROWTH Performed at [facility], [address], [place], [place] [address]    Report Status 03/07/2024 FINAL  Final    Renal Function: Recent Labs    03/06/24 9161  CREATININE 0.77   Estimated Creatinine Clearance: 82.4 mL/min (by C-G formula based on SCr of 0.77 mg/dL).  Radiologic Imaging: No results found.  Assessment:  Richard Irwin is a 78 y.o. year old M with ED refractory to other medical treatments  Plan:  To OR as planned for IPP. Procedure and risks reviewed, including but not limited to bleeding, infection, implant infection, implant malfunction, implant malplacement, erosion, damage to adjacent structures, pain, urinary retention. All questions answered   Herlene Foot, MD 03/12/2024, 9:20 AM  [facility] Pager: [phone]

## 2024-03-12 NOTE — Anesthesia Procedure Notes (Signed)
 Procedure Name: LMA Insertion Date/Time: 03/12/2024 9:41 AM  Performed by: Joshua Vernell BROCKS, CRNAPre-anesthesia Checklist: Patient identified, Emergency Drugs available, Suction available and Patient being monitored Patient Re-evaluated:Patient Re-evaluated prior to induction Oxygen Delivery Method: Circle system utilized Preoxygenation: Pre-oxygenation with 100% oxygen Induction Type: IV induction Ventilation: Mask ventilation without difficulty LMA: LMA inserted LMA Size: 5.0 Number of attempts: 1 Placement Confirmation: positive ETCO2 and breath sounds checked- equal and bilateral Tube secured with: Tape Dental Injury: Teeth and Oropharynx as per pre-operative assessment

## 2024-03-12 NOTE — Anesthesia Postprocedure Evaluation (Signed)
 Anesthesia Post Note  Patient: RYKER SUDBURY  Procedure(s) Performed: INSERTION, PENILE PROSTHESIS     Patient location during evaluation: PACU Anesthesia Type: General Level of consciousness: awake and alert Pain management: pain level controlled Vital Signs Assessment: post-procedure vital signs reviewed and stable Respiratory status: spontaneous breathing, nonlabored ventilation, respiratory function stable and patient connected to nasal cannula oxygen Cardiovascular status: blood pressure returned to baseline and stable Postop Assessment: no apparent nausea or vomiting Anesthetic complications: no   No notable events documented.  Last Vitals:  Vitals:   03/12/24 1425 03/12/24 1430  BP: 125/87   Pulse: 64   Resp: 16   Temp:  (!) 36.4 C  SpO2: 98%     Last Pain:  Vitals:   03/12/24 1425  TempSrc:   PainSc: 5                  Suzanne Garbers

## 2024-03-12 NOTE — Discharge Instructions (Addendum)

## 2024-03-12 NOTE — Op Note (Signed)
 PATIENT:  Richard Irwin  PRE-OPERATIVE DIAGNOSIS:  Organic erectile dysfunction  POST-OPERATIVE DIAGNOSIS:  Same  PROCEDURE:   3 piece inflatable penile prosthesis (BS/AMS) Injection of pharmacoagent into penis  SURGEON:  Herlene Foot MD  ASST: Jacqulyn Bound, MD  INDICATION: He has had long-standing organic erectile dysfunction and refractory to other modes of treatment. He has elected to proceed with prosthesis implantation.  ANESTHESIA:  General  EBL:  50 cc  Findings: Device: 3 piece AMS CX 700: 99 cc reservoir, 24 cm cylinders and 1 cm rear-tip extenders on right and left sides -on the right proximal dilation, the corpora gave way consistent with a proximal perforation. A suture/RTE sling was done on this side using 2-0 ethibond  LOCAL MEDICATIONS USED:   penile block done with 10 cc lidocaine /marcaine mixture 10 cc injected into corpora directly via butterfly needle  SPECIMEN: None    Description of procedure: The patient was taken to the major operating room, placed on the table and administered general anesthesia in the supine position. His genitalia was then prepped with chlorhexidine x 2. He was draped in the usual sterile fashion, and I used Ioban on the field. An official timeout was then performed.  A dorsal penile block was performed. A butterfly needle was then used to inject normal saline into the penis to give an artificial erection. There was no clinically significant curvature or deformity. I then injected 10 cc of lidocaine /marcaine into the penis.   A 14 French coude catheter was then placed in the bladder and the bladder was drained and the catheter was plugged. A midline penoscrotal incision was then made and the dissection was carried down to the corpora and urethra. The lonestar retractor was positioned so as to have excellent exposure. 2-0 Vicryl sutures were then placed proximally in each corpus cavernosum to serve as stay sutures. An incision was then  made in the corpus cavernosum first on the left-hand side with the bovie. Tamra were used to gently dilate the opening. I then dilated the corpus cavernosum with the a 12 Fr brooks dilator distally and proximally. Field goal post tests were performed and there was no evidence of perforations or crossover. I then irrigated the corpus cavernosum with antibiotic solution and measured the distance proximally and distally from the stay suture and was found to be 10 and 15 cm, respectively. I then turned my attention to the contralateral corpus cavernosum and placed my stay sutures, made my corporotomy and dilated the corpus cavernosum in an identical fashion. As mentioned above, the proximal corpora gave way consistent with a perforation. I attempted to redilate in the correct plane but was unable to do so. Ultimately, I elected to do a suture sling based on left sided measurements. This was measured and also was found to be 11.5 cm proximally and 15 distally. It was irrigated with anastomotic solution as was the scrotum. I then chose an 24 cm cylinder set with 1 cm rear-tip extenders and these were prepped while I prepared the site for reservoir placement.  I then digitally probed into the right external inguinal ring. My finger was used to poke through the posterior wall of the ring. I used my finger to ensure I was in the appropriate space, and to clear room for the reservoir. I irrigated the space with anastomotic solution and then placed the reservoir in this location. I then filled the reservoir with 99 cc of sterile saline, and checked to confirm proper position. There was minimal  backpressure with the reservoir max-filled.  Attention was redirected to the corporotomies where the cylinders were then placed by first fixing the suture to the distal aspect of the right cylinder to a straight needle. This was then loaded on the [facility] inserter and passed through the corporotomy and distally. I then advanced the  straight needle with the Furlow inserter out through the glans and this was grasped with a hemostat and pulled through the glans and the suture was secured with a hemostat. I then performed an identical maneuver on the contralateral side. After this was performed I irrigated both corpus cavernosum; there was no evidence of urethral perforation. I inserted the distal portion of the cylinder through the corporotomies and pulled this to the end of the corpora with the suture. On the right, I preplaced a 2-0 ethibond through the corpora-RTE-corpora. The proximal aspect with the rear-tip extender was then passed through the corporotomy and into the seated position on each side. I then connected reservoir tubing to a syringe filled with sterile saline and inflated the device. I noted a good straight erection with both cylinders equidistant under the glans and no buckling of the cylinders. I therefore deflated the device and closed the corporotomies with used my previously placed stay sutures. I then tied down the ethibond  I then grasped the scrotal skin in the midline with a babcock, and used a hemostat to dissect down to the dependent-most portion of the scrotum. The nasal speculum was inserted into this space, and facilitated placement of the pump. The cylinder was then connected to the pump after excising the excess tubing with appropriate shodded hemostats in place and then I used the supplied connectors to make the connection. I then again cycled the device with the pump and it cycled properly. I deflated the device and pumped it up about three quarters of the way to aid with hemostasis. I irrigated the wound one last time with antibiotic irrigation and then closed the deep scrotal tissue over the tubing and pump with running 3-0 monocryl suture. I placed a 10 Fr blake drain over the corporotomies. A second layer was then closed over this first layer with running 3- 0 monocryl, and running skin suture w/ 4-0  monocryl performed. Incision dressed with dermabond.  A mummy wrap was applied. The catheter was removed, and drain connected to suction bulb and the patient was awakened and taken recovery room in stable and satisfactory condition. He tolerated the procedure well and there were no intraoperative complications. Needle sponge and instrument counts were correct at the end of the operation.

## 2024-03-13 ENCOUNTER — Encounter (HOSPITAL_COMMUNITY): Payer: Self-pay | Admitting: Urology

## 2024-06-21 DIAGNOSIS — M810 Age-related osteoporosis without current pathological fracture: Secondary | ICD-10-CM | POA: Diagnosis not present

## 2024-06-21 DIAGNOSIS — M549 Dorsalgia, unspecified: Secondary | ICD-10-CM | POA: Diagnosis not present

## 2024-06-21 DIAGNOSIS — M353 Polymyalgia rheumatica: Secondary | ICD-10-CM | POA: Diagnosis not present

## 2024-06-21 DIAGNOSIS — M1990 Unspecified osteoarthritis, unspecified site: Secondary | ICD-10-CM | POA: Diagnosis not present

## 2024-06-21 DIAGNOSIS — D696 Thrombocytopenia, unspecified: Secondary | ICD-10-CM | POA: Diagnosis not present

## 2024-06-25 ENCOUNTER — Other Ambulatory Visit: Payer: Self-pay | Admitting: Cardiology

## 2024-10-02 ENCOUNTER — Other Ambulatory Visit: Payer: Self-pay | Admitting: Cardiology

## 2024-11-22 ENCOUNTER — Other Ambulatory Visit

## 2024-12-26 ENCOUNTER — Ambulatory Visit: Admitting: Cardiology
